# Patient Record
Sex: Male | Born: 1960 | Race: Asian | Hispanic: No | Marital: Married | Smoking: Never smoker
Health system: Southern US, Community
[De-identification: ages and names within clinical notes are randomized; demographics above are authoritative.]

## PROBLEM LIST (undated history)

## (undated) DIAGNOSIS — I480 Paroxysmal atrial fibrillation: Secondary | ICD-10-CM

## (undated) DIAGNOSIS — E0590 Thyrotoxicosis, unspecified without thyrotoxic crisis or storm: Secondary | ICD-10-CM

## (undated) DIAGNOSIS — Z9889 Other specified postprocedural states: Secondary | ICD-10-CM

## (undated) DIAGNOSIS — Z86010 Personal history of colonic polyps: Secondary | ICD-10-CM

## (undated) DIAGNOSIS — E785 Hyperlipidemia, unspecified: Secondary | ICD-10-CM

## (undated) DIAGNOSIS — N433 Hydrocele, unspecified: Secondary | ICD-10-CM

## (undated) DIAGNOSIS — G4730 Sleep apnea, unspecified: Secondary | ICD-10-CM

## (undated) DIAGNOSIS — I10 Essential (primary) hypertension: Secondary | ICD-10-CM

## (undated) HISTORY — DX: Other specified postprocedural states: Z98.890

## (undated) HISTORY — PX: COLONOSCOPY: SHX174

## (undated) HISTORY — PX: POLYPECTOMY: SHX149

## (undated) HISTORY — DX: Personal history of colonic polyps: Z86.010

## (undated) HISTORY — DX: Hydrocele, unspecified: N43.3

## (undated) HISTORY — DX: Paroxysmal atrial fibrillation: I48.0

## (undated) HISTORY — DX: Sleep apnea, unspecified: G47.30

## (undated) HISTORY — DX: Thyrotoxicosis, unspecified without thyrotoxic crisis or storm: E05.90

## (undated) HISTORY — DX: Essential (primary) hypertension: I10

## (undated) HISTORY — DX: Hyperlipidemia, unspecified: E78.5

---

## 2000-04-18 HISTORY — PX: KNEE SURGERY: SHX244

## 2001-05-18 ENCOUNTER — Emergency Department (HOSPITAL_COMMUNITY): Admission: EM | Admit: 2001-05-18 | Discharge: 2001-05-18 | Payer: Self-pay | Admitting: *Deleted

## 2002-11-18 ENCOUNTER — Encounter: Payer: Self-pay | Admitting: Specialist

## 2002-11-18 ENCOUNTER — Encounter: Admission: RE | Admit: 2002-11-18 | Discharge: 2002-11-18 | Payer: Self-pay | Admitting: Specialist

## 2004-05-20 ENCOUNTER — Ambulatory Visit: Payer: Self-pay | Admitting: Family Medicine

## 2004-06-03 ENCOUNTER — Ambulatory Visit: Payer: Self-pay | Admitting: Family Medicine

## 2004-07-29 ENCOUNTER — Ambulatory Visit: Payer: Self-pay | Admitting: Family Medicine

## 2004-08-03 ENCOUNTER — Ambulatory Visit: Payer: Self-pay | Admitting: Family Medicine

## 2005-01-10 ENCOUNTER — Encounter: Admission: RE | Admit: 2005-01-10 | Discharge: 2005-01-10 | Payer: Self-pay | Admitting: Occupational Medicine

## 2005-07-01 ENCOUNTER — Ambulatory Visit: Payer: Self-pay | Admitting: Internal Medicine

## 2005-08-31 ENCOUNTER — Ambulatory Visit: Payer: Self-pay | Admitting: Family Medicine

## 2005-09-09 ENCOUNTER — Ambulatory Visit: Payer: Self-pay | Admitting: Family Medicine

## 2006-09-06 ENCOUNTER — Ambulatory Visit: Payer: Self-pay | Admitting: Family Medicine

## 2006-09-06 LAB — CONVERTED CEMR LAB
Creatinine,U: 38.5 mg/dL
Hgb A1c MFr Bld: 5.6 % (ref 4.6–6.0)
Microalb Creat Ratio: 5.2 mg/g (ref 0.0–30.0)
Microalb, Ur: 0.2 mg/dL (ref 0.0–1.9)

## 2006-09-13 ENCOUNTER — Ambulatory Visit: Payer: Self-pay | Admitting: Family Medicine

## 2006-11-24 ENCOUNTER — Ambulatory Visit: Payer: Self-pay | Admitting: Family Medicine

## 2006-11-24 ENCOUNTER — Encounter: Payer: Self-pay | Admitting: Family Medicine

## 2006-11-24 DIAGNOSIS — B9789 Other viral agents as the cause of diseases classified elsewhere: Secondary | ICD-10-CM

## 2007-03-14 ENCOUNTER — Ambulatory Visit: Payer: Self-pay | Admitting: Family Medicine

## 2007-03-14 LAB — CONVERTED CEMR LAB: Hgb A1c MFr Bld: 6 % (ref 4.6–6.0)

## 2007-09-14 ENCOUNTER — Encounter: Payer: Self-pay | Admitting: *Deleted

## 2007-10-01 ENCOUNTER — Ambulatory Visit: Payer: Self-pay | Admitting: Family Medicine

## 2007-10-01 LAB — CONVERTED CEMR LAB
ALT: 31 units/L (ref 0–53)
AST: 29 units/L (ref 0–37)
Albumin: 4.3 g/dL (ref 3.5–5.2)
Alkaline Phosphatase: 49 units/L (ref 39–117)
BUN: 13 mg/dL (ref 6–23)
Basophils Absolute: 0 10*3/uL (ref 0.0–0.1)
Basophils Relative: 0.1 % (ref 0.0–1.0)
Bilirubin Urine: NEGATIVE
Bilirubin, Direct: 0.1 mg/dL (ref 0.0–0.3)
Blood in Urine, dipstick: NEGATIVE
CO2: 29 meq/L (ref 19–32)
Calcium: 8.9 mg/dL (ref 8.4–10.5)
Chloride: 101 meq/L (ref 96–112)
Cholesterol: 160 mg/dL (ref 0–200)
Creatinine, Ser: 0.8 mg/dL (ref 0.4–1.5)
Creatinine,U: 30.2 mg/dL
Direct LDL: 93.6 mg/dL
Eosinophils Absolute: 0.2 10*3/uL (ref 0.0–0.7)
Eosinophils Relative: 4.3 % (ref 0.0–5.0)
GFR calc Af Amer: 134 mL/min
GFR calc non Af Amer: 111 mL/min
Glucose, Bld: 80 mg/dL (ref 70–99)
Glucose, Urine, Semiquant: NEGATIVE
HCT: 41.3 % (ref 39.0–52.0)
HDL: 20 mg/dL — ABNORMAL LOW (ref >39.0)
Hemoglobin: 14.7 g/dL (ref 13.0–17.0)
Hgb A1c MFr Bld: 5.7 % (ref 4.6–6.0)
Ketones, urine, test strip: NEGATIVE
Lymphocytes Relative: 37.9 % (ref 12.0–46.0)
MCHC: 35.7 g/dL (ref 30.0–36.0)
MCV: 89.8 fL (ref 78.0–100.0)
Microalb Creat Ratio: 6.6 mg/g (ref 0.0–30.0)
Microalb, Ur: 0.2 mg/dL (ref 0.0–1.9)
Monocytes Absolute: 0.4 10*3/uL (ref 0.1–1.0)
Monocytes Relative: 8.6 % (ref 3.0–12.0)
Neutro Abs: 2.3 10*3/uL (ref 1.4–7.7)
Neutrophils Relative %: 49.1 % (ref 43.0–77.0)
Nitrite: NEGATIVE
Platelets: 140 10*3/uL — ABNORMAL LOW (ref 150–400)
Potassium: 3.7 meq/L (ref 3.5–5.1)
Protein, U semiquant: NEGATIVE
RBC: 4.6 M/uL (ref 4.22–5.81)
RDW: 12.2 % (ref 11.5–14.6)
Sodium: 138 meq/L (ref 135–145)
Specific Gravity, Urine: 1.01
TSH: 3.63 microintl units/mL (ref 0.35–5.50)
Total Bilirubin: 1.5 mg/dL — ABNORMAL HIGH (ref 0.3–1.2)
Total CHOL/HDL Ratio: 8
Total Protein: 7.3 g/dL (ref 6.0–8.3)
Triglycerides: 222 mg/dL (ref 0–149)
Urobilinogen, UA: 0.2
VLDL: 44 mg/dL — ABNORMAL HIGH (ref 0–40)
WBC Urine, dipstick: NEGATIVE
WBC: 4.6 10*3/uL (ref 4.5–10.5)
pH: 7

## 2007-10-08 ENCOUNTER — Ambulatory Visit: Payer: Self-pay | Admitting: Family Medicine

## 2007-11-01 ENCOUNTER — Telehealth: Payer: Self-pay | Admitting: Family Medicine

## 2008-03-28 ENCOUNTER — Ambulatory Visit: Payer: Self-pay | Admitting: Family Medicine

## 2008-03-28 LAB — CONVERTED CEMR LAB
BUN: 10 mg/dL (ref 6–23)
CO2: 29 meq/L (ref 19–32)
Calcium: 8.9 mg/dL (ref 8.4–10.5)
Chloride: 101 meq/L (ref 96–112)
Cholesterol: 165 mg/dL (ref 0–200)
Creatinine, Ser: 0.7 mg/dL (ref 0.4–1.5)
Direct LDL: 97.3 mg/dL
GFR calc Af Amer: 156 mL/min
GFR calc non Af Amer: 129 mL/min
Glucose, Bld: 115 mg/dL — ABNORMAL HIGH (ref 70–99)
HDL: 20 mg/dL — ABNORMAL LOW (ref >39.0)
Hgb A1c MFr Bld: 6.2 % — ABNORMAL HIGH (ref 4.6–6.0)
Potassium: 4.3 meq/L (ref 3.5–5.1)
Sodium: 136 meq/L (ref 135–145)
Total CHOL/HDL Ratio: 8.3
Triglycerides: 203 mg/dL (ref 0–149)
VLDL: 41 mg/dL — ABNORMAL HIGH (ref 0–40)

## 2008-04-08 ENCOUNTER — Telehealth: Payer: Self-pay | Admitting: Family Medicine

## 2008-06-07 DIAGNOSIS — M7520 Bicipital tendinitis, unspecified shoulder: Secondary | ICD-10-CM | POA: Insufficient documentation

## 2008-06-09 ENCOUNTER — Ambulatory Visit: Payer: Self-pay | Admitting: Family Medicine

## 2008-10-21 ENCOUNTER — Ambulatory Visit: Payer: Self-pay | Admitting: Family Medicine

## 2008-10-21 LAB — CONVERTED CEMR LAB
BUN: 11 mg/dL (ref 6–23)
Basophils Relative: 0.2 % (ref 0.0–3.0)
Bilirubin Urine: NEGATIVE
Bilirubin, Direct: 0 mg/dL (ref 0.0–0.3)
Blood in Urine, dipstick: NEGATIVE
CO2: 30 meq/L (ref 19–32)
Chloride: 103 meq/L (ref 96–112)
Cholesterol: 159 mg/dL (ref 0–200)
Creatinine, Ser: 0.8 mg/dL (ref 0.4–1.5)
Direct LDL: 95.8 mg/dL
Eosinophils Absolute: 0.1 10*3/uL (ref 0.0–0.7)
Glucose, Bld: 106 mg/dL — ABNORMAL HIGH (ref 70–99)
Glucose, Urine, Semiquant: NEGATIVE
Hgb A1c MFr Bld: 5.7 % (ref 4.6–6.5)
Ketones, urine, test strip: NEGATIVE
MCHC: 35.7 g/dL (ref 30.0–36.0)
MCV: 90.7 fL (ref 78.0–100.0)
Microalb, Ur: 0.8 mg/dL (ref 0.0–1.9)
Monocytes Absolute: 0.3 10*3/uL (ref 0.1–1.0)
Neutrophils Relative %: 51.9 % (ref 43.0–77.0)
Nitrite: NEGATIVE
Platelets: 136 10*3/uL — ABNORMAL LOW (ref 150.0–400.0)
Protein, U semiquant: NEGATIVE
RBC: 4.46 M/uL (ref 4.22–5.81)
Specific Gravity, Urine: 1.015
Total Protein: 7 g/dL (ref 6.0–8.3)
Urobilinogen, UA: 0.2
WBC Urine, dipstick: NEGATIVE
pH: 5.5

## 2008-10-27 ENCOUNTER — Encounter: Payer: Self-pay | Admitting: Family Medicine

## 2008-10-28 ENCOUNTER — Ambulatory Visit: Payer: Self-pay | Admitting: Family Medicine

## 2009-02-02 LAB — HM DIABETES EYE EXAM: HM Diabetic Eye Exam: NORMAL

## 2009-04-20 ENCOUNTER — Telehealth: Payer: Self-pay | Admitting: Family Medicine

## 2009-04-20 ENCOUNTER — Ambulatory Visit: Payer: Self-pay | Admitting: Family Medicine

## 2009-05-01 ENCOUNTER — Ambulatory Visit: Payer: Self-pay | Admitting: Family Medicine

## 2009-05-01 LAB — CONVERTED CEMR LAB
BUN: 7 mg/dL (ref 6–23)
CO2: 29 meq/L (ref 19–32)
Calcium: 9.2 mg/dL (ref 8.4–10.5)
Creatinine, Ser: 0.8 mg/dL (ref 0.4–1.5)
Glucose, Bld: 137 mg/dL — ABNORMAL HIGH (ref 70–99)

## 2009-05-06 ENCOUNTER — Ambulatory Visit: Payer: Self-pay | Admitting: Family Medicine

## 2009-10-28 ENCOUNTER — Ambulatory Visit: Payer: Self-pay | Admitting: Family Medicine

## 2009-10-28 LAB — CONVERTED CEMR LAB
ALT: 29 units/L (ref 0–53)
AST: 29 units/L (ref 0–37)
Basophils Relative: 0.3 % (ref 0.0–3.0)
Bilirubin, Direct: 0.1 mg/dL (ref 0.0–0.3)
Chloride: 101 meq/L (ref 96–112)
Direct LDL: 95.9 mg/dL
Eosinophils Relative: 1.7 % (ref 0.0–5.0)
Glucose, Urine, Semiquant: NEGATIVE
HCT: 42.4 % (ref 39.0–52.0)
Hgb A1c MFr Bld: 6 % (ref 4.6–6.5)
Lymphs Abs: 1.5 10*3/uL (ref 0.7–4.0)
MCV: 90.1 fL (ref 78.0–100.0)
Monocytes Absolute: 0.3 10*3/uL (ref 0.1–1.0)
Monocytes Relative: 7.9 % (ref 3.0–12.0)
Neutrophils Relative %: 52.8 % (ref 43.0–77.0)
Nitrite: NEGATIVE
Potassium: 4.9 meq/L (ref 3.5–5.1)
RBC: 4.7 M/uL (ref 4.22–5.81)
Sodium: 138 meq/L (ref 135–145)
Total CHOL/HDL Ratio: 7
Total Protein: 7.3 g/dL (ref 6.0–8.3)
Urobilinogen, UA: 0.2
VLDL: 57 mg/dL — ABNORMAL HIGH (ref 0.0–40.0)
WBC Urine, dipstick: NEGATIVE
WBC: 3.9 10*3/uL — ABNORMAL LOW (ref 4.5–10.5)

## 2009-10-30 ENCOUNTER — Ambulatory Visit: Payer: Self-pay | Admitting: Family Medicine

## 2009-10-30 LAB — HM DIABETES FOOT EXAM

## 2010-01-16 LAB — HM DIABETES EYE EXAM: HM Diabetic Eye Exam: NORMAL

## 2010-04-28 ENCOUNTER — Ambulatory Visit
Admission: RE | Admit: 2010-04-28 | Discharge: 2010-04-28 | Payer: Self-pay | Source: Home / Self Care | Attending: Family Medicine | Admitting: Family Medicine

## 2010-04-28 ENCOUNTER — Other Ambulatory Visit: Payer: Self-pay | Admitting: Family Medicine

## 2010-04-28 LAB — BASIC METABOLIC PANEL
BUN: 10 mg/dL (ref 6–23)
CO2: 29 mEq/L (ref 19–32)
Calcium: 9.1 mg/dL (ref 8.4–10.5)
Chloride: 103 mEq/L (ref 96–112)
Creatinine, Ser: 0.8 mg/dL (ref 0.4–1.5)
GFR: 112.59 mL/min (ref >60.00)
Glucose, Bld: 140 mg/dL — ABNORMAL HIGH (ref 70–99)
Potassium: 4.4 mEq/L (ref 3.5–5.1)
Sodium: 139 mEq/L (ref 135–145)

## 2010-04-28 LAB — HEMOGLOBIN A1C: Hgb A1c MFr Bld: 6.4 % (ref 4.6–6.5)

## 2010-05-05 ENCOUNTER — Ambulatory Visit
Admission: RE | Admit: 2010-05-05 | Discharge: 2010-05-05 | Payer: Self-pay | Source: Home / Self Care | Attending: Family Medicine | Admitting: Family Medicine

## 2010-05-18 NOTE — Assessment & Plan Note (Signed)
Summary: 6 month rov/njr   Vital Signs:  Patient profile:   50 year old male Weight:      166 pounds Temp:     98.4 degrees F oral BP sitting:   130 / 70  (left arm) Cuff size:   regular  Vitals Entered By: Kern Reap CMA Duncan Dull) (May 06, 2009 8:24 AM)  Reason for Visit 6 month follow up dm   History of Present Illness: shade is a 50 year old, married male, nonsmoker, who comes in today for evaluation of diabetes.  His fasting blood chair at home ranges from 110 to 125.  He takes Glucotrol 10 mg b.i.d., Glucophage 500 mg b.i.d. his weight is stable at 166.  He is meticulous about his diet and exercise program.  A1c6.3%.  BP rechecked by me 130/70  Allergies (verified): No Known Drug Allergies  Past History:  Past medical, surgical, family and social histories (including risk factors) reviewed for relevance to current acute and chronic problems.  Past Medical History: Reviewed history from 03/14/2007 and no changes required. Diabetes mellitus, type II Hyperlipidemia Hyperthyroidism right hydrocele right ACL repair  Family History: Reviewed history from 03/14/2007 and no changes required. Family History of Asthma Family History Diabetes 1st degree relative Family History Hypertension  Social History: Reviewed history from 03/14/2007 and no changes required. Occupation: [employer] Married Never Smoked Alcohol use-no Drug use-no Regular exercise-yes  Review of Systems      See HPI  Physical Exam  General:  Well-developed,well-nourished,in no acute distress; alert,appropriate and cooperative throughout examination   Impression & Recommendations:  Problem # 1:  DIABETES MELLITUS, TYPE II (ICD-250.00)  His updated medication list for this problem includes:    Altace 2.5 Mg Caps (Ramipril) ..... One in the morning    Glucophage 500 Mg Tabs (Metformin hcl) ..... One by mouth two times a day    Glucotrol Xl 10 Mg Tb24 (Glipizide) ..... One by mouth two  times a day  Complete Medication List: 1)  Altace 2.5 Mg Caps (Ramipril) .... One in the morning 2)  Glucophage 500 Mg Tabs (Metformin hcl) .... One by mouth two times a day 3)  Glucotrol Xl 10 Mg Tb24 (Glipizide) .... One by mouth two times a day 4)  Niaspan 500 Mg Tbcr (Niacin (antihyperlipidemic)) .... Take 1 tablet by mouth every morning 5)  Accu-chek Active Strp (Glucose blood) .... Use daily 6)  Flonase 50 Mcg/act Susp (Fluticasone propionate) .... Uad  Other Orders: Prescription Created Electronically [phone])  Patient Instructions: 1)  continue your current treatment program.  We turned the first week in July for your annual follow-up 2)  BMP prior to visit, ICD-9:.............250.00 3)  Hepatic Panel prior to visit, ICD-9: 4)  Lipid Panel prior to visit, ICD-9: 5)  TSH prior to visit, ICD-9: 6)  CBC w/ Diff prior to visit, ICD-9: 7)  Urine-dip prior to visit, ICD-9: 8)  HbgA1C prior to visit, ICD-9: 9)  Urine Microalbumin prior to visit, ICD-9:

## 2010-05-18 NOTE — Progress Notes (Signed)
Summary: wants ov today with dr Dorcus Riga  Phone Note Call from Patient Call back at [phone]   Caller: Patient-live call Reason for Call: Talk to Nurse Summary of Call: wants ov today. Has congestion. Initial call taken by: Warnell Forester,  April 20, 2009 10:14 AM  Follow-up for Phone Call        Fleet Contras please call if congestion.  His allergy mediated then plain Zyrtec or plain Claritin or, if it's viral then treat symptomatically with liquids.  Tylenol, etc. does not need o V  unless  less running fever, wheezing, etc. Follow-up by: Roderick Pee MD,  April 20, 2009 10:49 AM  Additional Follow-up for Phone Call Additional follow up Details #1::        patient walked in.  will be here for office visit this afternoon Additional Follow-up by: Kern Reap CMA Duncan Dull),  April 20, 2009 1:51 PM

## 2010-05-18 NOTE — Assessment & Plan Note (Signed)
Summary: cpx/njr   Vital Signs:  Patient profile:   50 year old male Height:      68 inches Weight:      158 pounds BMI:     24.11 Temp:     98.5 degrees F oral BP sitting:   124 / 78  (left arm) Cuff size:   regular  Vitals Entered By: Kern Reap CMA Duncan Dull) (October 30, 2009 8:22 AM) CC: cpx   CC:  cpx.  History of Present Illness: William Nixon  is a 50 year old, married male, nonsmokerwho comes in today for annual evaluation of diabetes, hypertension, allergic rhinitis.  His diabetes is treated with Glucophage 500 mg b.i.d., Glucotrol, 10 mg b.i.d., fasting blood sugar at home around 110.  A1c6.0, which represents excellent control.  He is on Altace 2.5 mg daily BP at home 124/78.  He also takes Niaspan 500 mg daily for elevated triglycerides.  He also uses Flonase nasal spray for allergic rhinitis.  Tetanus, but she 2010 seasonal flu 2010 exam October 2010 Dr. Luciana Axe, normal.  No retinopathy or  Diabetes Management History:      He says that he is exercising.    Allergies: No Known Drug Allergies  Past History:  Past medical, surgical, family and social histories (including risk factors) reviewed, and no changes noted (except as noted below).  Past Medical History: Reviewed history from 03/14/2007 and no changes required. Diabetes mellitus, type II Hyperlipidemia Hyperthyroidism right hydrocele right ACL repair  Family History: Reviewed history from 03/14/2007 and no changes required. Family History of Asthma Family History Diabetes 1st degree relative Family History Hypertension  Social History: Reviewed history from 03/14/2007 and no changes required. Occupation: [employer]. Married Never Smoked Alcohol use-no Drug use-no Regular exercise-yes  Review of Systems      See HPI  Physical Exam  General:  Well-developed,well-nourished,in no acute distress; alert,appropriate and cooperative throughout examination Head:  Normocephalic and atraumatic without  obvious abnormalities. No apparent alopecia or balding. Eyes:  No corneal or conjunctival inflammation noted. EOMI. Perrla. Funduscopic exam benign, without hemorrhages, exudates or papilledema. Vision grossly normal. Ears:  External ear exam shows no significant lesions or deformities.  Otoscopic examination reveals clear canals, tympanic membranes are intact bilaterally without bulging, retraction, inflammation or discharge. Hearing is grossly normal bilaterally. Nose:  External nasal examination shows no deformity or inflammation. Nasal mucosa are pink and moist without lesions or exudates. Mouth:  Oral mucosa and oropharynx without lesions or exudates.  Teeth in good repair. Neck:  No deformities, masses, or tenderness noted. Chest Wall:  No deformities, masses, tenderness or gynecomastia noted. Breasts:  No masses or gynecomastia noted Lungs:  Normal respiratory effort, chest expands symmetrically. Lungs are clear to auscultation, no crackles or wheezes. Heart:  Normal rate and regular rhythm. S1 and S2 normal without gallop, murmur, click, rub or other extra sounds. Abdomen:  Bowel sounds positive,abdomen soft and non-tender without masses, organomegaly or hernias noted. Rectal:  No external abnormalities noted. Normal sphincter tone. No rectal masses or tenderness. Genitalia:  Testes bilaterally descended without nodularity, tenderness or masses. No scrotal masses or lesions. No penis lesions or urethral discharge. Prostate:  Prostate gland firm and smooth, no enlargement, nodularity, tenderness, mass, asymmetry or induration. Msk:  No deformity or scoliosis noted of thoracic or lumbar spine.   Pulses:  R and L carotid,radial,femoral,dorsalis pedis and posterior tibial pulses are full and equal bilaterally Extremities:  No clubbing, cyanosis, edema, or deformity noted with normal full range of motion of all  joints.   Neurologic:  No cranial nerve deficits noted. Station and gait are normal.  Plantar reflexes are down-going bilaterally. DTRs are symmetrical throughout. Sensory, motor and coordinative functions appear intact. Skin:  Intact without suspicious lesions or rashes Cervical Nodes:  No lymphadenopathy noted Axillary Nodes:  No palpable lymphadenopathy Inguinal Nodes:  No significant adenopathy Psych:  Cognition and judgment appear intact. Alert and cooperative with normal attention span and concentration. No apparent delusions, illusions, hallucinations  Diabetes Management Exam:    Foot Exam (with socks and/or shoes not present):       Sensory-Pinprick/Light touch:          Left medial foot (L-4): normal          Left dorsal foot (L-5): normal          Left lateral foot (S-1): normal          Right medial foot (L-4): normal          Right dorsal foot (L-5): normal          Right lateral foot (S-1): normal       Sensory-Monofilament:          Left foot: normal          Right foot: normal       Inspection:          Left foot: normal          Right foot: normal       Nails:          Left foot: normal          Right foot: normal    Eye Exam:       Eye Exam done elsewhere          Date: 01/27/2009          Results: normal          Done by: rankin   Impression & Recommendations:  Problem # 1:  PHYSICAL EXAMINATION (ICD-V70.0) Assessment Unchanged  Orders: Prescription Created Electronically [phone])  Problem # 2:  HYPERLIPIDEMIA (ICD-272.4) Assessment: Improved  His updated medication list for this problem includes:    Niaspan 500 Mg Tbcr (Niacin (antihyperlipidemic)) .Marland Kitchen... Take 1 tablet by mouth two times a day  Orders: Prescription Created Electronically [phone]) EKG w/ Interpretation (93000)  Problem # 3:  DIABETES MELLITUS, TYPE II (ICD-250.00) Assessment: Improved  His updated medication list for this problem includes:    Altace 2.5 Mg Caps (Ramipril) ..... One in the morning    Glucophage 500 Mg Tabs (Metformin hcl) ..... One by mouth two  times a day    Glucotrol Xl 10 Mg Tb24 (Glipizide) ..... One by mouth two times a day  Orders: Prescription Created Electronically [phone]) EKG w/ Interpretation (93000)  Complete Medication List: 1)  Altace 2.5 Mg Caps (Ramipril) .... One in the morning 2)  Glucophage 500 Mg Tabs (Metformin hcl) .... One by mouth two times a day 3)  Glucotrol Xl 10 Mg Tb24 (Glipizide) .... One by mouth two times a day 4)  Niaspan 500 Mg Tbcr (Niacin (antihyperlipidemic)) .... Take 1 tablet by mouth two times a day 5)  Accu-chek Active Strp (Glucose blood) .... Use daily 6)  Flonase 50 Mcg/act Susp (Fluticasone propionate) .... Uad  Patient Instructions: 1)  Please schedule a follow-up appointment in 6 months. 2)  Check your blood sugars regularly. If your readings are usually above : or below 70 you should contact our office. 3)  It is  important that your Diabetic A1c level is checked every 3 months. 4)  See your eye doctor yearly to check for diabetic eye damage. 5)  Check your feet each night for sore areas, calluses or signs of infection. 6)  Check your Blood Pressure regularly. If it is above: you should make an appointment. 7)  BMP prior to visit, ICD-9:....250.00 8)  HbgA1C prior to visit, ICD-9: 9)  increase the Niaspan  to 500 mg b.i.d. Prescriptions: FLONASE 50 MCG/ACT SUSP (FLUTICASONE PROPIONATE) UAD  #1 x 1   Entered and Authorized by:   Roderick Pee MD   Signed by:   Roderick Pee MD on 10/30/2009   Method used:   Electronically to        [facility] [phone]* (retail)       [address]       [place], [place]  [address]       Ph: [phone]       Fax: [phone]   RxID:   [id number] ACCU-CHEK ACTIVE   STRP (GLUCOSE BLOOD) use daily  #100 x 3   Entered and Authorized by:   Roderick Pee MD   Signed by:   Roderick Pee MD on 10/30/2009   Method used:   Electronically to        [facility] # [phone]* (retail)       [address]       [place], [place]   [address]       Ph: [phone]       Fax: [phone]   RxID:   [id number] GLUCOTROL XL 10 MG TB24 (GLIPIZIDE) one by mouth two times a day  #200 x 3   Entered and Authorized by:   Roderick Pee MD   Signed by:   Roderick Pee MD on 10/30/2009   Method used:   Electronically to        [facility] # [phone]* (retail)       [address]       [place], [place]  [address]       Ph: [phone]       Fax: [phone]   RxID:   [id number] GLUCOPHAGE 500 MG TABS (METFORMIN HCL) one by mouth two times a day  #200 x 3   Entered and Authorized by:   Roderick Pee MD   Signed by:   Roderick Pee MD on 10/30/2009   Method used:   Electronically to        [facility] # [phone]* (retail)       [address]       [place], [place]  [address]       Ph: [phone]       Fax: [phone]   RxID:   [id number] ALTACE 2.5 MG CAPS (RAMIPRIL) one in the morning  #100 x 3   Entered and Authorized by:   Roderick Pee MD   Signed by:   Roderick Pee MD on 10/30/2009   Method used:   Electronically to        [facility] # [phone]* (retail)       [address]       [place], [place]  [address]       Ph: [phone]       Fax: [phone]   RxID:   [id number] NIASPAN 500 MG  TBCR (NIACIN (ANTIHYPERLIPIDEMIC)) Take 1 tablet by mouth two times a day  #200  x 3   Entered and Authorized by:   Roderick Pee MD   Signed by:   Roderick Pee MD on 10/30/2009   Method used:   Electronically to        [facility] # [phone]* (retail)       [address]       [place], [place]  [address]       Ph: [phone]       Fax: [phone]   RxID:   [id number]    Immunization History:  Influenza Immunization History:    Influenza:  historical (01/16/2009)

## 2010-05-18 NOTE — Assessment & Plan Note (Signed)
Summary: SINUSISTIS//LH   Vital Signs:  Patient profile:   50 year old male Weight:      160 pounds Temp:     98.8 degrees F oral BP sitting:   120 / 80  (left arm) Cuff size:   regular  Vitals Entered By: Kern Reap CMA Duncan Dull) (April 20, 2009 2:30 PM)  Reason for Visit sinus pressure congestion   History of Present Illness: P is a 50 year old, married male, nonsmoker, who comes in with a cold for one week.  His main complaint is head congestion.  He has no fever, earache, and sore throat.  He does have a slight cough, but no sputum production.  Review of systems negative for hypertension, diabetes, under good control.  Last eye exam February 12, 2009 normal.  No retinopathy  Allergies: No Known Drug Allergies  Past History:  Past medical, surgical, family and social histories (including risk factors) reviewed for relevance to current acute and chronic problems.  Past Medical History: Reviewed history from 03/14/2007 and no changes required. Diabetes mellitus, type II Hyperlipidemia Hyperthyroidism right hydrocele right ACL repair  Family History: Reviewed history from 03/14/2007 and no changes required. Family History of Asthma Family History Diabetes 1st degree relative Family History Hypertension  Social History: Reviewed history from 03/14/2007 and no changes required. Occupation: [employer]. Married Never Smoked Alcohol use-no Drug use-no Regular exercise-yes  Review of Systems      See HPI  Physical Exam  General:  Well-developed,well-nourished,in no acute distress; alert,appropriate and cooperative throughout examination Head:  Normocephalic and atraumatic without obvious abnormalities. No apparent alopecia or balding. Eyes:  No corneal or conjunctival inflammation noted. EOMI. Perrla. Funduscopic exam benign, without hemorrhages, exudates or papilledema. Vision grossly normal. Ears:  External ear exam shows no significant lesions or deformities.   Otoscopic examination reveals clear canals, tympanic membranes are intact bilaterally without bulging, retraction, inflammation or discharge. Hearing is grossly normal bilaterally. Nose:  External nasal examination shows no deformity or inflammation. Nasal mucosa are pink and moist without lesions or exudates. Mouth:  Oral mucosa and oropharynx without lesions or exudates.  Teeth in good repair. Neck:  No deformities, masses, or tenderness noted. Chest Wall:  No deformities, masses, tenderness or gynecomastia noted. Lungs:  Normal respiratory effort, chest expands symmetrically. Lungs are clear to auscultation, no crackles or wheezes.  Diabetes Management Exam:    Foot Exam (with socks and/or shoes not present):       Sensory-Pinprick/Light touch:          Left medial foot (L-4): normal          Left dorsal foot (L-5): normal          Left lateral foot (S-1): normal          Right medial foot (L-4): normal          Right dorsal foot (L-5): normal          Right lateral foot (S-1): normal       Sensory-Monofilament:          Left foot: normal          Right foot: normal       Inspection:          Left foot: normal          Right foot: normal       Nails:          Left foot: normal          Right foot:  normal    Eye Exam:       Eye Exam done elsewhere          Date: 02/02/2009          Results: normal          Done by: dr Barbaraann Barthel   Impression & Recommendations:  Problem # 1:  INFECTION, VIRAL NOS (ICD-079.99) Assessment Deteriorated  His updated medication list for this problem includes:    Hydromet 5-1.5 Mg/77ml Syrp (Hydrocodone-homatropine) .Marland Kitchen... 1 or 2 tsps at bedtime as needed  Complete Medication List: 1)  Altace 2.5 Mg Caps (Ramipril) .... One in the morning 2)  Glucophage 500 Mg Tabs (Metformin hcl) .... One by mouth two times a day 3)  Glucotrol Xl 10 Mg Tb24 (Glipizide) .... One by mouth two times a day 4)  Niaspan 500 Mg Tbcr (Niacin (antihyperlipidemic)) .... Take  1 tablet by mouth every morning 5)  Accu-chek Active Strp (Glucose blood) .... Use daily 6)  Hydromet 5-1.5 Mg/62ml Syrp (Hydrocodone-homatropine) .Marland Kitchen.. 1 or 2 tsps at bedtime as needed 7)  Flonase 50 Mcg/act Susp (Fluticasone propionate) .... Uad  Patient Instructions: 1)  consume a 30 ounces of water daily. 2)  Take one or 2 teaspoons of Hydromet at bedtime as needed for cough and cold. 3)  Use Afrin nasal spray, one shot up each nostril followed by the steroid nasal spray, followed by the saline nasal spray  with a netti pot to help the , head congestion Prescriptions: FLONASE 50 MCG/ACT SUSP (FLUTICASONE PROPIONATE) UAD  #1 x 1   Entered and Authorized by:   Roderick Pee MD   Signed by:   Roderick Pee MD on 04/20/2009   Method used:     RxID:   [id number] HYDROMET 5-1.5 MG/5ML SYRP (HYDROCODONE-HOMATROPINE) 1 or 2 tsps at bedtime as needed  #8oz x 1   Entered and Authorized by:   Roderick Pee MD   Signed by:   Roderick Pee MD on 04/20/2009   Method used:     RxID:   [id number]

## 2010-05-20 NOTE — Assessment & Plan Note (Signed)
Summary: 6 month fup//ccm   Vital Signs:  Patient profile:   50 year old male Weight:      158 pounds Temp:     98.3 degrees F oral BP sitting:   142 / 80  (left arm) Cuff size:   regular  Vitals Entered By: Duard Brady LPN (May 05, 2010 8:29 AM) CC: 6 mos rov - doing well   fbs 118 Is Patient Diabetic? Yes Did you bring your meter with you today? No   CC:  6 mos rov - doing well   fbs 118.  History of Present Illness: Pras. is a 50 year old, married male, nonsmoker, who comes in today for evaluation of type 2 diabetes.  He currently takes Glucophage 500 mg b.i.d., Glucotrol, 10 mg b.i.d., blood sugar at home.  It is averaging in the 115, 120 range.  Hemoglobin A1c6.4.  Typically his A1c is in the 5.5 range, however, he's not been as active.  This winter.  Review of systems negative  Preventive Screening-Counseling & Management  Alcohol-Tobacco     Smoking Status: never  Allergies (verified): No Known Drug Allergies  Past History:  Past medical, surgical, family and social histories (including risk factors) reviewed for relevance to current acute and chronic problems.  Past Medical History: Reviewed history from 03/14/2007 and no changes required. Diabetes mellitus, type II Hyperlipidemia Hyperthyroidism right hydrocele right ACL repair  Family History: Reviewed history from 03/14/2007 and no changes required. Family History of Asthma Family History Diabetes 1st degree relative Family History Hypertension  Social History: Reviewed history from 03/14/2007 and no changes required. Occupation: [employer]. Married Never Smoked Alcohol use-no Drug use-no Regular exercise-yes  Review of Systems      See HPI  Physical Exam  General:  Well-developed,well-nourished,in no acute distress; alert,appropriate and cooperative throughout examination   Impression & Recommendations:  Problem # 1:  DIABETES MELLITUS, TYPE II (ICD-250.00) Assessment  Improved  His updated medication list for this problem includes:    Altace 2.5 Mg Caps (Ramipril) ..... One in the morning    Glucophage 500 Mg Tabs (Metformin hcl) ..... One by mouth two times a day    Glucotrol Xl 10 Mg Tb24 (Glipizide) ..... One by mouth two times a day  Complete Medication List: 1)  Altace 2.5 Mg Caps (Ramipril) .... One in the morning 2)  Glucophage 500 Mg Tabs (Metformin hcl) .... One by mouth two times a day 3)  Glucotrol Xl 10 Mg Tb24 (Glipizide) .... One by mouth two times a day 4)  Niaspan 500 Mg Tbcr (Niacin (antihyperlipidemic)) .... Take 1 tablet by mouth two times a day 5)  Accu-chek Active Strp (Glucose blood) .... Use daily 6)  Flonase 50 Mcg/act Susp (Fluticasone propionate) .... Uad  Patient Instructions: 1)  Please schedule a follow-up appointment in 6 months.....250.00 2)  BMP prior to visit, ICD-9: 3)  Hepatic Panel prior to visit, ICD-9: 4)  Lipid Panel prior to visit, ICD-9: 5)  TSH prior to visit, ICD-9: 6)  CBC w/ Diff prior to visit, ICD-9: 7)  Urine-dip prior to visit, ICD-9: 8)  HbgA1C prior to visit, ICD-9: 9)  Urine Microalbumin prior to visit, ICD-9:   Orders Added: 1)  Est. Patient Level III [74259]

## 2010-06-18 ENCOUNTER — Other Ambulatory Visit: Payer: Self-pay | Admitting: Family Medicine

## 2010-10-14 ENCOUNTER — Other Ambulatory Visit: Payer: Self-pay | Admitting: Family Medicine

## 2010-10-27 ENCOUNTER — Other Ambulatory Visit (INDEPENDENT_AMBULATORY_CARE_PROVIDER_SITE_OTHER): Payer: BC Managed Care – PPO

## 2010-10-27 DIAGNOSIS — Z0000 Encounter for general adult medical examination without abnormal findings: Secondary | ICD-10-CM

## 2010-10-27 LAB — CBC WITH DIFFERENTIAL/PLATELET
Basophils Relative: 0.3 % (ref 0.0–3.0)
Eosinophils Relative: 3 % (ref 0.0–5.0)
HCT: 42.9 % (ref 39.0–52.0)
Hemoglobin: 14.9 g/dL (ref 13.0–17.0)
Lymphs Abs: 1.5 10*3/uL (ref 0.7–4.0)
Monocytes Relative: 6.3 % (ref 3.0–12.0)
Platelets: 138 10*3/uL — ABNORMAL LOW (ref 150.0–400.0)
RBC: 4.69 Mil/uL (ref 4.22–5.81)
WBC: 4.1 10*3/uL — ABNORMAL LOW (ref 4.5–10.5)

## 2010-10-27 LAB — POCT URINALYSIS DIPSTICK
Bilirubin, UA: NEGATIVE
Leukocytes, UA: NEGATIVE
Nitrite, UA: NEGATIVE
Protein, UA: NEGATIVE
pH, UA: 7

## 2010-10-27 LAB — HEPATIC FUNCTION PANEL
ALT: 33 U/L (ref 0–53)
AST: 30 U/L (ref 0–37)
Total Bilirubin: 1.2 mg/dL (ref 0.3–1.2)

## 2010-10-27 LAB — HEMOGLOBIN A1C: Hgb A1c MFr Bld: 6.5 % (ref 4.6–6.5)

## 2010-10-27 LAB — BASIC METABOLIC PANEL
BUN: 12 mg/dL (ref 6–23)
GFR: 112.35 mL/min (ref >60.00)
Potassium: 3.8 mEq/L (ref 3.5–5.1)
Sodium: 137 mEq/L (ref 135–145)

## 2010-10-27 LAB — LIPID PANEL
LDL Cholesterol: 91 mg/dL (ref 0–99)
VLDL: 35.4 mg/dL (ref 0.0–40.0)

## 2010-10-27 LAB — TSH: TSH: 3.57 u[IU]/mL (ref 0.35–5.50)

## 2010-10-28 ENCOUNTER — Encounter: Payer: Self-pay | Admitting: Family Medicine

## 2010-11-02 ENCOUNTER — Ambulatory Visit (INDEPENDENT_AMBULATORY_CARE_PROVIDER_SITE_OTHER): Payer: BC Managed Care – PPO | Admitting: Family Medicine

## 2010-11-02 ENCOUNTER — Encounter: Payer: Self-pay | Admitting: Family Medicine

## 2010-11-02 DIAGNOSIS — E119 Type 2 diabetes mellitus without complications: Secondary | ICD-10-CM

## 2010-11-02 DIAGNOSIS — Z0000 Encounter for general adult medical examination without abnormal findings: Secondary | ICD-10-CM

## 2010-11-02 DIAGNOSIS — E785 Hyperlipidemia, unspecified: Secondary | ICD-10-CM

## 2010-11-02 MED ORDER — METFORMIN HCL 500 MG PO TABS: 500.0000 mg | ORAL_TABLET | Freq: Two times a day (BID) | ORAL | Status: DC

## 2010-11-02 MED ORDER — GLIPIZIDE ER 10 MG PO TB24: 10.0000 mg | ORAL_TABLET | Freq: Two times a day (BID) | ORAL | Status: DC

## 2010-11-02 MED ORDER — GLUCOSE BLOOD VI STRP: ORAL_STRIP | Status: DC

## 2010-11-02 MED ORDER — RAMIPRIL 2.5 MG PO CAPS: 2.5000 mg | ORAL_CAPSULE | ORAL | Status: DC

## 2010-11-02 NOTE — Patient Instructions (Signed)
Continue your current medications.  Follow-up office visit in 6 months.  Blood work one week prior

## 2010-11-02 NOTE — Progress Notes (Signed)
  Subjective:    Patient ID: William Nixon, male    DOB: 07-Sep-1960, 50 y.o.   MRN: [id number]  HPIprashanth Is a delightful, 50 year old, married male, nonsmoker, who comes in today for evaluation of allergic rhinitis.  Diabetes, type II hyperlipidemia.  His diabetes is treated with metformin 500 mg b.i.d. And glipizide 10 mg b.i.d.  Blood sugar 137A1c 6.5%.  He uses steroid nasal spray p.r.n. For allergic rhinitis.  He takes Niaspan 500 mg b.i.d. For mild hyperlipidemia.  He also takes Altace 2.5 mg daily for renal protection because of the diabetes.  Routine eye care, dental care, tetanus, 2010, annual eye exam October 2011 normal.  No retinopathy.  BP at home 128/80    Review of Systems  Constitutional: Negative.   HENT: Negative.   Eyes: Negative.   Respiratory: Negative.   Cardiovascular: Negative.   Gastrointestinal: Negative.   Genitourinary: Negative.   Musculoskeletal: Negative.   Skin: Negative.   Neurological: Negative.   Hematological: Negative.   Psychiatric/Behavioral: Negative.        Objective:   Physical Exam  Constitutional: He is oriented to person, place, and time. He appears well-developed and well-nourished.  HENT:  Head: Normocephalic and atraumatic.  Right Ear: External ear normal.  Left Ear: External ear normal.  Nose: Nose normal.  Mouth/Throat: Oropharynx is clear and moist.  Eyes: Conjunctivae and EOM are normal. Pupils are equal, round, and reactive to light.  Neck: Normal range of motion. Neck supple. No JVD present. No tracheal deviation present. No thyromegaly present.  Cardiovascular: Normal rate, regular rhythm, normal heart sounds and intact distal pulses.  Exam reveals no gallop and no friction rub.   No murmur heard. Pulmonary/Chest: Effort normal and breath sounds normal. No stridor. No respiratory distress. He has no wheezes. He has no rales. He exhibits no tenderness.  Abdominal: Soft. Bowel sounds are normal. He exhibits  no distension and no mass. There is no tenderness. There is no rebound and no guarding.  Genitourinary: Rectum normal, prostate normal and penis normal. Guaiac negative stool. No penile tenderness.  Musculoskeletal: Normal range of motion. He exhibits no edema and no tenderness.  Lymphadenopathy:    He has no cervical adenopathy.  Neurological: He is alert and oriented to person, place, and time. He has normal reflexes. No cranial nerve deficit. He exhibits normal muscle tone.  Skin: Skin is warm and dry. No rash noted. No erythema. No pallor.  Psychiatric: He has a normal mood and affect. His behavior is normal. Judgment and thought content normal.          Assessment & Plan:  Healthy male.  Allergic rhinitis.  Continue Flonase p.r.n.  Diabetes type II.  Continue glipizide 10 mg b.i.d. And metformin 500 b.i.d.  Continue aspirin alternate 7 Niaspan.  Return in 6 months for follow-up.  A1c

## 2010-12-13 ENCOUNTER — Other Ambulatory Visit: Payer: Self-pay | Admitting: Family Medicine

## 2011-03-29 ENCOUNTER — Telehealth: Payer: Self-pay | Admitting: Family Medicine

## 2011-03-29 NOTE — Telephone Encounter (Signed)
Transderm-Scop patches dispensed 3 directions one patch every 72 hours, refills x 1

## 2011-03-29 NOTE — Telephone Encounter (Signed)
Pt is going on a cruise on 04/07/11 for 5 days and is needing to get a patch for motion sickness called in to [facility] on [address].

## 2011-04-01 MED ORDER — SCOPOLAMINE 1 MG/3DAYS TD PT72: 1.0000 | MEDICATED_PATCH | TRANSDERMAL | Status: DC

## 2011-04-01 NOTE — Telephone Encounter (Signed)
Rx sent to pharmacy   

## 2011-04-27 ENCOUNTER — Other Ambulatory Visit (INDEPENDENT_AMBULATORY_CARE_PROVIDER_SITE_OTHER): Payer: BC Managed Care – PPO

## 2011-04-27 DIAGNOSIS — E119 Type 2 diabetes mellitus without complications: Secondary | ICD-10-CM

## 2011-04-27 LAB — BASIC METABOLIC PANEL
BUN: 12 mg/dL (ref 6–23)
Calcium: 9.4 mg/dL (ref 8.4–10.5)
Creatinine, Ser: 0.8 mg/dL (ref 0.4–1.5)
GFR: 105.84 mL/min (ref >60.00)

## 2011-04-27 LAB — MICROALBUMIN / CREATININE URINE RATIO: Microalb, Ur: 2.2 mg/dL — ABNORMAL HIGH (ref 0.0–1.9)

## 2011-05-04 ENCOUNTER — Ambulatory Visit (INDEPENDENT_AMBULATORY_CARE_PROVIDER_SITE_OTHER): Payer: BC Managed Care – PPO | Admitting: Family Medicine

## 2011-05-04 ENCOUNTER — Encounter: Payer: Self-pay | Admitting: Family Medicine

## 2011-05-04 DIAGNOSIS — J069 Acute upper respiratory infection, unspecified: Secondary | ICD-10-CM

## 2011-05-04 DIAGNOSIS — E119 Type 2 diabetes mellitus without complications: Secondary | ICD-10-CM

## 2011-05-04 MED ORDER — HYDROCODONE-HOMATROPINE 5-1.5 MG/5ML PO SYRP: ORAL_SOLUTION | ORAL | Status: DC

## 2011-05-04 NOTE — Progress Notes (Signed)
  Subjective:    Patient ID: William Nixon, male    DOB: 05/02/1960, 51 y.o.   MRN: [id number]  HPI P. Is a 51 year old, married male, nonsmoker, who comes in today for evaluation of two problems.  He has long-standing diabetes, type 2, which she manages with diet, exercise on a daily basis, but the side, 10 mg b.i.d., and metformin 500 mg b.i.d.  Blood sugar at home 120 to 130A1c 6.7% and stable.  Three days ago he developed a viral infection with head congestion, sore throat, and cough.  No fever, no sputum production.  No difficulty breathing.   Review of Systems    General and pulmonary review of systems otherwise negative Objective:   Physical Exam  Well-developed well-nourished, male in no acute distress.  HEENT negative.  Neck was supple.  No adenopathy.  Lungs are clear      Assessment & Plan:  Diabetes type II adult continue current therapy.  Follow-up annual physical.  Viral syndrome.  Treat symptomatically with liquids.  Cough syrup.  Return p.r.n.

## 2011-05-04 NOTE — Patient Instructions (Signed)
Continue your current medications for your diabetes.  You are doing an excellent job.  For your viral infection.  Hydromet one half to 1 teaspoon at bedtime as needed for cough and cold.  Set up your annual physical for July.  Labs one week prior

## 2011-05-05 ENCOUNTER — Telehealth: Payer: Self-pay | Admitting: Family Medicine

## 2011-05-05 MED ORDER — FLUTICASONE PROPIONATE 50 MCG/ACT NA SUSP: 2.0000 | Freq: Every day | NASAL | Status: DC

## 2011-05-05 NOTE — Telephone Encounter (Signed)
Pt was seen yesterday and did not get his refill of Flonase spray. Please send to [facility]. Thanks.

## 2011-11-02 ENCOUNTER — Other Ambulatory Visit (INDEPENDENT_AMBULATORY_CARE_PROVIDER_SITE_OTHER): Payer: BC Managed Care – PPO

## 2011-11-02 DIAGNOSIS — E119 Type 2 diabetes mellitus without complications: Secondary | ICD-10-CM

## 2011-11-02 LAB — POCT URINALYSIS DIPSTICK
Bilirubin, UA: NEGATIVE
Blood, UA: NEGATIVE
Glucose, UA: NEGATIVE
Nitrite, UA: NEGATIVE

## 2011-11-02 LAB — LIPID PANEL
HDL: 27 mg/dL — ABNORMAL LOW (ref >39.00)
Triglycerides: 215 mg/dL — ABNORMAL HIGH (ref 0.0–149.0)
VLDL: 43 mg/dL — ABNORMAL HIGH (ref 0.0–40.0)

## 2011-11-02 LAB — BASIC METABOLIC PANEL
CO2: 28 mEq/L (ref 19–32)
Chloride: 99 mEq/L (ref 96–112)
GFR: 110.26 mL/min (ref >60.00)
Glucose, Bld: 144 mg/dL — ABNORMAL HIGH (ref 70–99)
Potassium: 3.9 mEq/L (ref 3.5–5.1)
Sodium: 137 mEq/L (ref 135–145)

## 2011-11-02 LAB — HEPATIC FUNCTION PANEL
ALT: 40 U/L (ref 0–53)
Total Bilirubin: 1.1 mg/dL (ref 0.3–1.2)
Total Protein: 7.3 g/dL (ref 6.0–8.3)

## 2011-11-02 LAB — CBC WITH DIFFERENTIAL/PLATELET
Basophils Relative: 0.4 % (ref 0.0–3.0)
Eosinophils Relative: 3.2 % (ref 0.0–5.0)
Lymphocytes Relative: 36.7 % (ref 12.0–46.0)
Neutrophils Relative %: 52.7 % (ref 43.0–77.0)
RBC: 4.76 Mil/uL (ref 4.22–5.81)
WBC: 4.2 10*3/uL — ABNORMAL LOW (ref 4.5–10.5)

## 2011-11-02 LAB — LDL CHOLESTEROL, DIRECT: Direct LDL: 98.4 mg/dL

## 2011-11-09 ENCOUNTER — Encounter: Payer: BC Managed Care – PPO | Admitting: Family Medicine

## 2011-11-17 ENCOUNTER — Ambulatory Visit (INDEPENDENT_AMBULATORY_CARE_PROVIDER_SITE_OTHER): Payer: BC Managed Care – PPO | Admitting: Family Medicine

## 2011-11-17 ENCOUNTER — Encounter: Payer: Self-pay | Admitting: Family Medicine

## 2011-11-17 VITALS — BP 130/80 | Temp 98.0°F | Ht 69.0 in | Wt 158.0 lb

## 2011-11-17 DIAGNOSIS — E785 Hyperlipidemia, unspecified: Secondary | ICD-10-CM

## 2011-11-17 DIAGNOSIS — E119 Type 2 diabetes mellitus without complications: Secondary | ICD-10-CM

## 2011-11-17 DIAGNOSIS — J3081 Allergic rhinitis due to animal (cat) (dog) hair and dander: Secondary | ICD-10-CM

## 2011-11-17 MED ORDER — RAMIPRIL 2.5 MG PO CAPS: 2.5000 mg | ORAL_CAPSULE | ORAL | Status: DC

## 2011-11-17 MED ORDER — GLUCOSE BLOOD VI STRP: ORAL_STRIP | Status: DC

## 2011-11-17 MED ORDER — NIACIN ER (ANTIHYPERLIPIDEMIC) 500 MG PO TBCR: 500.0000 mg | EXTENDED_RELEASE_TABLET | Freq: Two times a day (BID) | ORAL | Status: DC

## 2011-11-17 MED ORDER — GLIPIZIDE ER 10 MG PO TB24: 10.0000 mg | ORAL_TABLET | Freq: Two times a day (BID) | ORAL | Status: DC

## 2011-11-17 MED ORDER — FLUTICASONE PROPIONATE 50 MCG/ACT NA SUSP: 2.0000 | Freq: Every day | NASAL | Status: DC

## 2011-11-17 MED ORDER — METFORMIN HCL 500 MG PO TABS: 500.0000 mg | ORAL_TABLET | Freq: Two times a day (BID) | ORAL | Status: DC

## 2011-11-17 NOTE — Patient Instructions (Signed)
Continue current medications  We will set you up for a screening colonoscopy  Followup on your blood sugar in 6 months  Labs one week prior to office visit

## 2011-11-17 NOTE — Progress Notes (Signed)
  Subjective:    Patient ID: William Nixon, male    DOB: 10-12-1960, 51 y.o.   MRN: [id number]  HPI P. is a 51 year old married male nonsmoker who comes in today for physical examination because of a history of hypertension and diabetes  His med list reviewed in detail and there've been no changes. His blood sugars are averaging 80-120 at home a. Hemoglobin A1c here recently 6.6%  He gets routine eye care, dental care, will schedule for screening colonoscopy, tetanus 2010.   Review of Systems  Constitutional: Negative.   HENT: Negative.   Eyes: Negative.   Respiratory: Negative.   Cardiovascular: Negative.   Gastrointestinal: Negative.   Genitourinary: Negative.   Musculoskeletal: Negative.   Skin: Negative.   Neurological: Negative.   Hematological: Negative.   Psychiatric/Behavioral: Negative.        Objective:   Physical Exam  Constitutional: He is oriented to person, place, and time. He appears well-developed and well-nourished.  HENT:  Head: Normocephalic and atraumatic.  Right Ear: External ear normal.  Left Ear: External ear normal.  Nose: Nose normal.  Mouth/Throat: Oropharynx is clear and moist.  Eyes: Conjunctivae and EOM are normal. Pupils are equal, round, and reactive to light.  Neck: Normal range of motion. Neck supple. No JVD present. No tracheal deviation present. No thyromegaly present.  Cardiovascular: Normal rate, regular rhythm, normal heart sounds and intact distal pulses.  Exam reveals no gallop and no friction rub.   No murmur heard. Pulmonary/Chest: Effort normal and breath sounds normal. No stridor. No respiratory distress. He has no wheezes. He has no rales. He exhibits no tenderness.  Abdominal: Soft. Bowel sounds are normal. He exhibits no distension and no mass. There is no tenderness. There is no rebound and no guarding.  Genitourinary: Rectum normal, prostate normal and penis normal. Guaiac negative stool. No penile tenderness.    Musculoskeletal: Normal range of motion. He exhibits no edema and no tenderness.  Lymphadenopathy:    He has no cervical adenopathy.  Neurological: He is alert and oriented to person, place, and time. He has normal reflexes. No cranial nerve deficit. He exhibits normal muscle tone.  Skin: Skin is warm and dry. No rash noted. No erythema. No pallor.  Psychiatric: He has a normal mood and affect. His behavior is normal. Judgment and thought content normal.          Assessment & Plan:  Healthy male  Diabetes type 2 continue current medication  Allergic rhinitis continue current medication  Mild hypertension continue all medication  Return in one year sooner if any problem we'll set up for screening colonoscopy

## 2011-12-22 ENCOUNTER — Telehealth: Payer: Self-pay | Admitting: Gastroenterology

## 2011-12-22 ENCOUNTER — Encounter: Payer: Self-pay | Admitting: Gastroenterology

## 2011-12-29 NOTE — Telephone Encounter (Signed)
error 

## 2012-01-02 ENCOUNTER — Ambulatory Visit (INDEPENDENT_AMBULATORY_CARE_PROVIDER_SITE_OTHER): Payer: BC Managed Care – PPO | Admitting: Family Medicine

## 2012-01-02 ENCOUNTER — Encounter: Payer: Self-pay | Admitting: Family Medicine

## 2012-01-02 VITALS — BP 180/80 | Temp 98.0°F | Wt 158.0 lb

## 2012-01-02 DIAGNOSIS — J069 Acute upper respiratory infection, unspecified: Secondary | ICD-10-CM

## 2012-01-02 DIAGNOSIS — E119 Type 2 diabetes mellitus without complications: Secondary | ICD-10-CM

## 2012-01-02 DIAGNOSIS — I10 Essential (primary) hypertension: Secondary | ICD-10-CM | POA: Insufficient documentation

## 2012-01-02 DIAGNOSIS — E1169 Type 2 diabetes mellitus with other specified complication: Secondary | ICD-10-CM

## 2012-01-02 DIAGNOSIS — E1159 Type 2 diabetes mellitus with other circulatory complications: Secondary | ICD-10-CM

## 2012-01-02 MED ORDER — RAMIPRIL 5 MG PO CAPS: 5.0000 mg | ORAL_CAPSULE | Freq: Every day | ORAL | Status: DC

## 2012-01-02 NOTE — Patient Instructions (Signed)
Take 2.5 mg of Ramapo now  Starting tomorrow morning take 5 mg daily in the morning  Check your blood pressure daily in the morning  Return in one week for followup

## 2012-01-02 NOTE — Progress Notes (Signed)
  Subjective:    Patient ID: William Nixon, male    DOB: July 24, 1960, 51 y.o.   MRN: [id number]  HPIp. Is a 51 year old male married nonsmoker who comes in today for evaluation of lightheadedness  He states that yesterday he was driving home and began to feel lightheaded. Blood sugar was in the 138 range. He's been on 2.5 mg of Ramapo daily for renal protection because of the diabetes. His blood pressure and the past has always been normal. He exercises on a regular basis he runs about 45 miles per day.    Review of Systems    general and cardiac review of systems otherwise negative Objective:   Physical Exam  Well-developed well-nourished male in no acute distress BP right arm sitting position 160/90      Assessment & Plan:  Hypertension associated with diabetes plan increase Avapro to 5 mg daily BP check daily followup in one week

## 2012-01-03 ENCOUNTER — Ambulatory Visit (AMBULATORY_SURGERY_CENTER): Payer: BC Managed Care – PPO | Admitting: *Deleted

## 2012-01-03 ENCOUNTER — Encounter: Payer: Self-pay | Admitting: Gastroenterology

## 2012-01-03 VITALS — Ht 68.0 in | Wt 159.2 lb

## 2012-01-03 DIAGNOSIS — Z1211 Encounter for screening for malignant neoplasm of colon: Secondary | ICD-10-CM

## 2012-01-03 MED ORDER — NA SULFATE-K SULFATE-MG SULF 17.5-3.13-1.6 GM/177ML PO SOLN: 1.0000 | Freq: Once | ORAL | Status: DC

## 2012-01-09 ENCOUNTER — Ambulatory Visit (INDEPENDENT_AMBULATORY_CARE_PROVIDER_SITE_OTHER): Payer: BC Managed Care – PPO | Admitting: Family Medicine

## 2012-01-09 ENCOUNTER — Encounter: Payer: Self-pay | Admitting: Family Medicine

## 2012-01-09 VITALS — BP 130/84 | Temp 98.4°F | Wt 159.0 lb

## 2012-01-09 DIAGNOSIS — E1169 Type 2 diabetes mellitus with other specified complication: Secondary | ICD-10-CM

## 2012-01-09 DIAGNOSIS — I10 Essential (primary) hypertension: Secondary | ICD-10-CM

## 2012-01-09 DIAGNOSIS — E1159 Type 2 diabetes mellitus with other circulatory complications: Secondary | ICD-10-CM

## 2012-01-09 NOTE — Progress Notes (Signed)
  Subjective:    Patient ID: William Nixon, male    DOB: 14-Sep-1960, 51 y.o.   MRN: [id number]  HPI P is a 51 year old male type II diabetic who has underlying hypertension who comes in for followup. We recently saw him his blood sugar is normal however his blood pressure was elevated we therefore increased his Prempro from 2.5 mg daily to 5 mg. He comes back today for followup. BP 130/84 also home blood pressure monitoring shows blood pressures now drop back to normal.  He is an appointment to see orthopedics Dr. Derenda Fennel on Thursday    Review of Systems General and cardiovascular review of systems otherwise negative    Objective:   Physical Exam Well-developed well-nourished male in no acute distress BP right arm sitting position 130/84       Assessment & Plan:  Hypertension at goal monitor blood pressure return if it's persistently elevated over a two-week period time for medication readjustment

## 2012-01-09 NOTE — Patient Instructions (Signed)
Monitor your blood pressure 3 times weekly return when necessary  Blood pressure goal 135/85 or less

## 2012-01-16 ENCOUNTER — Encounter: Payer: BC Managed Care – PPO | Admitting: Gastroenterology

## 2012-03-02 ENCOUNTER — Encounter: Payer: Self-pay | Admitting: Family Medicine

## 2012-03-06 ENCOUNTER — Encounter (HOSPITAL_COMMUNITY): Payer: Self-pay | Admitting: Emergency Medicine

## 2012-03-06 ENCOUNTER — Emergency Department (HOSPITAL_COMMUNITY)
Admission: EM | Admit: 2012-03-06 | Discharge: 2012-03-06 | Disposition: A | Discharge status: Home / Self Care | Payer: No Typology Code available for payment source | Attending: Emergency Medicine | Admitting: Emergency Medicine

## 2012-03-06 DIAGNOSIS — I10 Essential (primary) hypertension: Secondary | ICD-10-CM | POA: Insufficient documentation

## 2012-03-06 DIAGNOSIS — S1093XA Contusion of unspecified part of neck, initial encounter: Secondary | ICD-10-CM | POA: Insufficient documentation

## 2012-03-06 DIAGNOSIS — E119 Type 2 diabetes mellitus without complications: Secondary | ICD-10-CM | POA: Insufficient documentation

## 2012-03-06 DIAGNOSIS — S0003XA Contusion of scalp, initial encounter: Secondary | ICD-10-CM | POA: Insufficient documentation

## 2012-03-06 DIAGNOSIS — E785 Hyperlipidemia, unspecified: Secondary | ICD-10-CM | POA: Insufficient documentation

## 2012-03-06 DIAGNOSIS — Y9389 Activity, other specified: Secondary | ICD-10-CM | POA: Insufficient documentation

## 2012-03-06 DIAGNOSIS — Z9889 Other specified postprocedural states: Secondary | ICD-10-CM | POA: Insufficient documentation

## 2012-03-06 DIAGNOSIS — Z79899 Other long term (current) drug therapy: Secondary | ICD-10-CM | POA: Insufficient documentation

## 2012-03-06 DIAGNOSIS — E039 Hypothyroidism, unspecified: Secondary | ICD-10-CM | POA: Insufficient documentation
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
## 2012-03-09 NOTE — ED Provider Notes (Signed)
Medical screening examination/treatment/procedure(s) were performed by non-physician practitioner and as supervising physician I was immediately available for consultation/collaboration.  Toy Baker, MD 03/09/12 [phone]

## 2012-05-17 ENCOUNTER — Other Ambulatory Visit: Payer: BC Managed Care – PPO

## 2012-05-18 ENCOUNTER — Other Ambulatory Visit (INDEPENDENT_AMBULATORY_CARE_PROVIDER_SITE_OTHER): Payer: BC Managed Care – PPO

## 2012-05-18 DIAGNOSIS — E119 Type 2 diabetes mellitus without complications: Secondary | ICD-10-CM

## 2012-05-18 LAB — BASIC METABOLIC PANEL
BUN: 13 mg/dL (ref 6–23)
CO2: 30 mEq/L (ref 19–32)
Calcium: 8.9 mg/dL (ref 8.4–10.5)
Creatinine, Ser: 0.7 mg/dL (ref 0.4–1.5)
GFR: 118.64 mL/min (ref >60.00)
Glucose, Bld: 180 mg/dL — ABNORMAL HIGH (ref 70–99)

## 2012-05-24 ENCOUNTER — Ambulatory Visit: Payer: BC Managed Care – PPO | Admitting: Family Medicine

## 2012-05-28 ENCOUNTER — Encounter: Payer: Self-pay | Admitting: Family Medicine

## 2012-05-28 ENCOUNTER — Ambulatory Visit (INDEPENDENT_AMBULATORY_CARE_PROVIDER_SITE_OTHER): Payer: BC Managed Care – PPO | Admitting: Family Medicine

## 2012-05-28 VITALS — BP 140/80 | HR 84 | Temp 98.5°F | Wt 147.0 lb

## 2012-05-28 DIAGNOSIS — E1169 Type 2 diabetes mellitus with other specified complication: Secondary | ICD-10-CM

## 2012-05-28 DIAGNOSIS — E119 Type 2 diabetes mellitus without complications: Secondary | ICD-10-CM

## 2012-05-28 DIAGNOSIS — I10 Essential (primary) hypertension: Secondary | ICD-10-CM

## 2012-05-28 MED ORDER — METFORMIN HCL 1000 MG PO TABS: ORAL_TABLET | ORAL | Status: DC

## 2012-05-28 NOTE — Progress Notes (Signed)
  Subjective:    Patient ID: William Nixon, male    DOB: 1960-08-29, 52 y.o.   MRN: [id number]  HPI P. Is a 52 year old male married nonsmoker who comes in today for evaluation of diabetes type 2  He's been on metformin 500 mg twice a day glipizide 10 mg twice a day in the past his blood sugars have been in the 110 range with an A1c of 6.5%. Male his blood sugars in the 180 range A1c is 8.1%  He is been compliant with his medication exercise and diet weight is stable at 147 pounds he works out daily  Blood pressure is slightly elevated 140/80 he's currently on Altace 5 mg daily   Review of Systems    review of systems otherwise negative Objective:   Physical Exam  Well-developed well-nourished thin male no acute distress      Assessment & Plan:  Diabetes type 2 not at goal increase metformin to 1000 mg twice a day continue glipizide 10 mg twice a day followup in 3 months

## 2012-05-28 NOTE — Patient Instructions (Signed)
Check a fasting blood sugar daily in the morning  Continue the glipizide 10 mg twice a day  Increase the metformin to 1000 mg twice a day  Return in 3 months for followup  Nonfasting labs one week prior  Continue your blood pressure medication,,,,,,,,, also stay on a salt free diet  Check your blood pressure daily in the morning  When you return in 3 months bring a record of your blood pressure and blood sugars

## 2012-05-29 ENCOUNTER — Ambulatory Visit: Payer: BC Managed Care – PPO | Admitting: Family Medicine

## 2012-05-30 ENCOUNTER — Ambulatory Visit: Payer: BC Managed Care – PPO | Admitting: Family Medicine

## 2012-08-22 ENCOUNTER — Other Ambulatory Visit (INDEPENDENT_AMBULATORY_CARE_PROVIDER_SITE_OTHER): Payer: BC Managed Care – PPO

## 2012-08-22 DIAGNOSIS — E119 Type 2 diabetes mellitus without complications: Secondary | ICD-10-CM

## 2012-08-22 LAB — BASIC METABOLIC PANEL
BUN: 10 mg/dL (ref 6–23)
Calcium: 9.3 mg/dL (ref 8.4–10.5)
Creatinine, Ser: 0.7 mg/dL (ref 0.4–1.5)
GFR: 135.24 mL/min (ref >60.00)
Glucose, Bld: 103 mg/dL — ABNORMAL HIGH (ref 70–99)

## 2012-08-22 LAB — HEMOGLOBIN A1C: Hgb A1c MFr Bld: 6.4 % (ref 4.6–6.5)

## 2012-08-27 ENCOUNTER — Encounter: Payer: Self-pay | Admitting: Family Medicine

## 2012-08-27 ENCOUNTER — Ambulatory Visit: Payer: BC Managed Care – PPO | Admitting: Family Medicine

## 2012-08-27 ENCOUNTER — Ambulatory Visit (INDEPENDENT_AMBULATORY_CARE_PROVIDER_SITE_OTHER): Payer: BC Managed Care – PPO | Admitting: Family Medicine

## 2012-08-27 VITALS — BP 120/80 | Temp 98.8°F | Wt 154.0 lb

## 2012-08-27 DIAGNOSIS — E119 Type 2 diabetes mellitus without complications: Secondary | ICD-10-CM

## 2012-08-27 MED ORDER — GLUCOSE BLOOD VI STRP: 1.0000 | ORAL_STRIP | Freq: Every day | Status: DC

## 2012-08-27 NOTE — Patient Instructions (Signed)
Glipizide 10 mg and metformin 500 mg in the morning  Prior to your evening meal glipizide 10 mg in 1000 mg of metformin  If you still have episodes of hypo-glycemia then decrease the morning dose of glipizide to 5 mg  Followup in July for your annual exam

## 2012-08-27 NOTE — Progress Notes (Signed)
  Subjective:    Patient ID: William Nixon, male    DOB: 10-25-1960, 52 y.o.   MRN: [id number]  HPI chadwin is a 52 year old male who comes in today for evaluation of diabetes type 2  3 months ago his A1c went up to 8.1. We increased his medication to 1000 mg of metformin twice a day and continued the glipizide 10 mg twice a day. His blood sugars dropped in the 100 range and A1c now 6.4  He's having difficulty now with hypoglycemia. He runs about 4-5 miles per day. He runs about 6 PM. He states she's been splitting his metformin. Instead of taking 1000 mg in the morning he takes 500 mg in the morning and 500 before his evening meal and then 1000 before dinner.   Review of Systems Review of systems otherwise negative    Objective:   Physical Exam  Well-developed well-nourished male in no acute distress  Diabetic foot exam normal no neuropathy pulses normal      Assessment & Plan:  Diabetes type 2 plan decrease morning dose to 500 mg of metformin continue the thousand milligrams dose prior to his evening meal and the glipizide 10 mg twice a day if this combination still results and hypoglycemia than he's to call

## 2012-09-03 ENCOUNTER — Ambulatory Visit: Payer: BC Managed Care – PPO | Admitting: Family Medicine

## 2012-11-07 ENCOUNTER — Other Ambulatory Visit (INDEPENDENT_AMBULATORY_CARE_PROVIDER_SITE_OTHER): Payer: BC Managed Care – PPO

## 2012-11-07 DIAGNOSIS — E119 Type 2 diabetes mellitus without complications: Secondary | ICD-10-CM

## 2012-11-07 LAB — HEMOGLOBIN A1C: Hgb A1c MFr Bld: 6.8 % — ABNORMAL HIGH (ref 4.6–6.5)

## 2012-11-07 LAB — POCT URINALYSIS DIPSTICK
Bilirubin, UA: NEGATIVE
Ketones, UA: NEGATIVE
Leukocytes, UA: NEGATIVE
Nitrite, UA: NEGATIVE
Protein, UA: NEGATIVE
pH, UA: 7

## 2012-11-07 LAB — HEPATIC FUNCTION PANEL
AST: 24 U/L (ref 0–37)
Total Bilirubin: 1.2 mg/dL (ref 0.3–1.2)

## 2012-11-07 LAB — CBC WITH DIFFERENTIAL/PLATELET
Basophils Absolute: 0 10*3/uL (ref 0.0–0.1)
Eosinophils Relative: 3.4 % (ref 0.0–5.0)
Lymphs Abs: 1.5 10*3/uL (ref 0.7–4.0)
Monocytes Absolute: 0.3 10*3/uL (ref 0.1–1.0)
Monocytes Relative: 6.8 % (ref 3.0–12.0)
Neutrophils Relative %: 55.2 % (ref 43.0–77.0)
Platelets: 149 10*3/uL — ABNORMAL LOW (ref 150.0–400.0)
RDW: 12.7 % (ref 11.5–14.6)
WBC: 4.5 10*3/uL (ref 4.5–10.5)

## 2012-11-07 LAB — LIPID PANEL
Total CHOL/HDL Ratio: 7
Triglycerides: 295 mg/dL — ABNORMAL HIGH (ref 0.0–149.0)

## 2012-11-07 LAB — PSA: PSA: 1.15 ng/mL (ref 0.10–4.00)

## 2012-11-07 LAB — BASIC METABOLIC PANEL
BUN: 9 mg/dL (ref 6–23)
Chloride: 99 mEq/L (ref 96–112)
Creatinine, Ser: 0.7 mg/dL (ref 0.4–1.5)
Glucose, Bld: 140 mg/dL — ABNORMAL HIGH (ref 70–99)
Potassium: 4.3 mEq/L (ref 3.5–5.1)

## 2012-11-07 LAB — MICROALBUMIN / CREATININE URINE RATIO: Creatinine,U: 47.8 mg/dL

## 2012-11-15 ENCOUNTER — Encounter: Payer: BC Managed Care – PPO | Admitting: Family Medicine

## 2012-11-19 ENCOUNTER — Other Ambulatory Visit: Payer: Self-pay | Admitting: Family Medicine

## 2012-11-20 ENCOUNTER — Ambulatory Visit (INDEPENDENT_AMBULATORY_CARE_PROVIDER_SITE_OTHER): Payer: BC Managed Care – PPO | Admitting: Family

## 2012-11-20 ENCOUNTER — Encounter: Payer: Self-pay | Admitting: Family

## 2012-11-20 VITALS — BP 158/88 | HR 71 | Ht 69.0 in | Wt 153.0 lb

## 2012-11-20 DIAGNOSIS — Z0000 Encounter for general adult medical examination without abnormal findings: Secondary | ICD-10-CM

## 2012-11-20 DIAGNOSIS — E119 Type 2 diabetes mellitus without complications: Secondary | ICD-10-CM

## 2012-11-20 DIAGNOSIS — I10 Essential (primary) hypertension: Secondary | ICD-10-CM

## 2012-11-20 NOTE — Progress Notes (Signed)
Subjective:    Patient ID: William Nixon, male    DOB: 10-05-1960, 52 y.o.   MRN: [id number]  HPI  Patient presents for yearly preventative medicine examination.ll I  A long time and she and in ll immunizations and health maintenance protocols were reviewed with the patient and they are up to date with these protocols. Screening laboratory values were reviewed with the patient including screening of hyperlipidemia PSA renal function and hepatic function. There medications past medical history social history problem list and allergies were reviewed in detail. Goals were established with regard to weight loss exercise diet in compliance with medications   Review of Systems  Constitutional: Negative.   HENT: Negative.   Eyes: Negative.   Respiratory: Negative.   Cardiovascular: Negative.   Gastrointestinal: Negative.   Endocrine: Negative.   Genitourinary: Negative.   Musculoskeletal: Negative.   Skin: Negative.   Allergic/Immunologic: Negative.   Neurological: Negative.   Hematological: Negative.   Psychiatric/Behavioral: Negative.    Past Medical History  Diagnosis Date  . Diabetes mellitus     type II  . Hyperlipidemia   . Hyperthyroidism   . Right hydrocele   . S/P ACL repair     right   . Hypertension     History   Social History  . Marital Status: Married    Spouse Name: N/A    Number of Children: N/A  . Years of Education: N/A   Occupational History  . Not on file.   Social History Main Topics  . Smoking status: Never Smoker   . Smokeless tobacco: Never Used  . Alcohol Use: 1.0 oz/week    2 drink(s) per week  . Drug Use: No  . Sexually Active: Not on file   Other Topics Concern  . Not on file   Social History Narrative  . No narrative on file    Past Surgical History  Procedure Laterality Date  . Knee surgery  2002    right    Family History  Problem Relation Age of Onset  . Alcohol abuse Other   . Diabetes Other   . Hypertension Other    . Colon cancer Neg Hx   . Rectal cancer Neg Hx   . Stomach cancer Neg Hx   . Esophageal cancer Neg Hx     No Known Allergies  Current Outpatient Prescriptions on File Prior to Visit  Medication Sig Dispense Refill  . fish oil-omega-3 fatty acids 1000 MG capsule Take 1 g by mouth 2 (two) times daily.       Marland Kitchen GLIPIZIDE XL 10 MG 24 hr tablet take 1 tablet by mouth twice a day  200 tablet  3  . glucose blood test strip 1 each by Other route daily. Dx 250.00 Accu- Check Nano  100 each  12  . metFORMIN (GLUCOPHAGE) 1000 MG tablet 1 by mouth twice a day  200 tablet  3  . metFORMIN (GLUCOPHAGE) 500 MG tablet Take 1 tablet (500 mg total) by mouth 2 (two) times daily.  200 tablet  3  . Multiple Vitamin (MULTIVITAMIN WITH MINERALS) TABS Take 1 tablet by mouth daily. Centrum      . NIASPAN 500 MG CR tablet take 1 tablet by mouth twice a day  200 tablet  3  . ramipril (ALTACE) 5 MG capsule Take 1 capsule (5 mg total) by mouth daily.  90 capsule  3  . Bioflavonoid Products (BIOFLEX PO) Take 1 tablet by mouth daily.  No current facility-administered medications on file prior to visit.    BP 158/88  Pulse 71  Ht 5\' 9"  (1.753 m)  Wt 153 lb (69.4 kg)  BMI 22.58 kg/m2  SpO2 98%chart    Objective:   Physical Exam  Constitutional: He is oriented to person, place, and time. He appears well-developed and well-nourished.  HENT:  Head: Normocephalic.  Right Ear: External ear normal.  Left Ear: External ear normal.  Nose: Nose normal.  Mouth/Throat: Oropharynx is clear and moist.  Eyes: Conjunctivae and EOM are normal. Pupils are equal, round, and reactive to light.  Neck: Normal range of motion. Neck supple. No thyromegaly present.  Cardiovascular: Normal rate, regular rhythm and normal heart sounds.   Pulmonary/Chest: Effort normal and breath sounds normal.  Abdominal: Soft. Bowel sounds are normal.  Musculoskeletal: Normal range of motion. He exhibits no edema and no tenderness.   Neurological: He is alert and oriented to person, place, and time. He has normal reflexes.  Skin: Skin is warm and dry.  Psychiatric: He has a normal mood and affect.          Assessment & Plan:  Assessment: 1. Preventative Healthcare 2. Type 2 DM 3. Hypercholesterolemia 4. Hypertension  Plan: Continue current meds. Continue a healthy diet and exercise. Call the office with any questions or concerns. Recheck in 6 months and sooner as needed.

## 2012-11-20 NOTE — Patient Instructions (Addendum)
Colonoscopy  A colonoscopy is an exam to evaluate your entire colon. In this exam, your colon is cleansed. A long fiberoptic tube is inserted through your rectum and into your colon. The fiberoptic scope (endoscope) is a long bundle of enclosed and very flexible fibers. These fibers transmit light to the area examined and send images from that area to your caregiver. Discomfort is usually minimal. You may be given a drug to help you sleep (sedative) during or prior to the procedure. This exam helps to detect lumps (tumors), polyps, inflammation, and areas of bleeding. Your caregiver may also take a small piece of tissue (biopsy) that will be examined under a microscope.  LET YOUR CAREGIVER KNOW ABOUT:   · Allergies to food or medicine.  · Medicines taken, including vitamins, herbs, eyedrops, over-the-counter medicines, and creams.  · Use of steroids (by mouth or creams).  · Previous problems with anesthetics or numbing medicines.  · History of bleeding problems or blood clots.  · Previous surgery.  · Other health problems, including diabetes and kidney problems.  · Possibility of pregnancy, if this applies.  BEFORE THE PROCEDURE   · A clear liquid diet may be required for 2 days before the exam.  · Ask your caregiver about changing or stopping your regular medications.  · Liquid injections (enemas) or laxatives may be required.  · A large amount of electrolyte solution may be given to you to drink over a short period of time. This solution is used to clean out your colon.  · You should be present 60 minutes prior to your procedure or as directed by your caregiver.  AFTER THE PROCEDURE   · If you received a sedative or pain relieving medication, you will need to arrange for someone to drive you home.  · Occasionally, there is a little blood passed with the first bowel movement. Do not be concerned.  FINDING OUT THE RESULTS OF YOUR TEST  Not all test results are available during your visit. If your test results are  not back during the visit, make an appointment with your caregiver to find out the results. Do not assume everything is normal if you have not heard from your caregiver or the medical facility. It is important for you to follow up on all of your test results.  HOME CARE INSTRUCTIONS   · It is not unusual to pass moderate amounts of gas and experience mild abdominal cramping following the procedure. This is due to air being used to inflate your colon during the exam. Walking or a warm pack on your belly (abdomen) may help.  · You may resume all normal meals and activities after sedatives and medicines have worn off.  · Only take over-the-counter or prescription medicines for pain, discomfort, or fever as directed by your caregiver. Do not use aspirin or blood thinners if a biopsy was taken. Consult your caregiver for medicine usage if biopsies were taken.  SEEK IMMEDIATE MEDICAL CARE IF:   · You have a fever.  · You pass large blood clots or fill a toilet with blood following the procedure. This may also occur 10 to 14 days following the procedure. This is more likely if a biopsy was taken.  · You develop abdominal pain that keeps getting worse and cannot be relieved with medicine.  Document Released: 04/01/2000 Document Revised: 06/27/2011 Document Reviewed: 11/15/2007  [employer]® Patient Information ©2014 [employer].

## 2012-11-26 ENCOUNTER — Other Ambulatory Visit: Payer: Self-pay

## 2012-11-26 MED ORDER — GLUCOSE BLOOD VI STRP: 1.0000 | ORAL_STRIP | Freq: Every day | Status: DC

## 2012-11-28 ENCOUNTER — Telehealth: Payer: Self-pay | Admitting: Family Medicine

## 2012-11-28 NOTE — Telephone Encounter (Signed)
PT states that he would like to speak with you in regards to his new machine and his testing strips. He's trying to figure out the cheapest way to fill his strips. Please assist.

## 2012-11-29 MED ORDER — GLUCOSE BLOOD VI STRP: ORAL_STRIP | Status: DC

## 2012-11-29 NOTE — Telephone Encounter (Signed)
Spoke with pharmacy and patient.  New rx sent.

## 2012-12-05 ENCOUNTER — Encounter: Payer: Self-pay | Admitting: Internal Medicine

## 2012-12-20 ENCOUNTER — Other Ambulatory Visit: Payer: Self-pay | Admitting: Family Medicine

## 2013-01-10 ENCOUNTER — Telehealth: Payer: Self-pay | Admitting: Family Medicine

## 2013-01-10 DIAGNOSIS — E119 Type 2 diabetes mellitus without complications: Secondary | ICD-10-CM

## 2013-01-10 NOTE — Telephone Encounter (Signed)
Pt had last a1c on 11-20-12. Pt would like to sch another a1c in 6 month. Can I sch?

## 2013-01-11 NOTE — Telephone Encounter (Signed)
Pt is sch °

## 2013-01-11 NOTE — Telephone Encounter (Signed)
I have ordered the labs.  Okay to schedule.

## 2013-01-17 ENCOUNTER — Other Ambulatory Visit: Payer: Self-pay | Admitting: Family Medicine

## 2013-02-07 ENCOUNTER — Encounter: Payer: BC Managed Care – PPO | Admitting: Internal Medicine

## 2013-03-08 ENCOUNTER — Other Ambulatory Visit: Payer: Self-pay | Admitting: *Deleted

## 2013-03-08 MED ORDER — NIACIN ER 500 MG PO CPCR: 500.0000 mg | ORAL_CAPSULE | Freq: Every day | ORAL | Status: DC

## 2013-03-08 NOTE — Telephone Encounter (Signed)
Insurance will no longer cover Niaspan.  New Rx sent for niacin.

## 2013-03-11 ENCOUNTER — Encounter: Payer: Self-pay | Admitting: Family Medicine

## 2013-05-01 ENCOUNTER — Telehealth: Payer: Self-pay | Admitting: Family Medicine

## 2013-05-01 DIAGNOSIS — E119 Type 2 diabetes mellitus without complications: Secondary | ICD-10-CM

## 2013-05-01 NOTE — Telephone Encounter (Signed)
Pt is [facility] to [facility] [address] from [facility]. Pt needs new rxs ramipril 5 mg #90, glipizide xl 10 mg #180, accu check glucose test strips #90, niacin 500 mg #180  and metformin 1000 mg #270.

## 2013-05-02 MED ORDER — NIACIN ER (ANTIHYPERLIPIDEMIC) 500 MG PO TBCR: EXTENDED_RELEASE_TABLET | ORAL | Status: DC

## 2013-05-02 MED ORDER — GLIPIZIDE ER 10 MG PO TB24: ORAL_TABLET | ORAL | Status: DC

## 2013-05-02 MED ORDER — METFORMIN HCL 1000 MG PO TABS
ORAL_TABLET | ORAL | Status: DC
Start: 2013-05-02 — End: 2013-05-28

## 2013-05-02 MED ORDER — RAMIPRIL 5 MG PO CAPS: ORAL_CAPSULE | ORAL | Status: DC

## 2013-05-02 NOTE — Telephone Encounter (Signed)
Medications sent but unable to send test strips because not sure which accu chek test strips to refill. Tried to call patient but unable to leave message.

## 2013-05-28 ENCOUNTER — Other Ambulatory Visit: Payer: Self-pay | Admitting: Family Medicine

## 2013-05-29 ENCOUNTER — Other Ambulatory Visit (INDEPENDENT_AMBULATORY_CARE_PROVIDER_SITE_OTHER): Payer: 59

## 2013-05-29 DIAGNOSIS — E119 Type 2 diabetes mellitus without complications: Secondary | ICD-10-CM

## 2013-05-29 LAB — BASIC METABOLIC PANEL
BUN: 9 mg/dL (ref 6–23)
CHLORIDE: 101 meq/L (ref 96–112)
CO2: 28 meq/L (ref 19–32)
Calcium: 9 mg/dL (ref 8.4–10.5)
Creatinine, Ser: 0.7 mg/dL (ref 0.4–1.5)
GFR: 132.51 mL/min (ref >60.00)
Glucose, Bld: 160 mg/dL — ABNORMAL HIGH (ref 70–99)
POTASSIUM: 3.7 meq/L (ref 3.5–5.1)
SODIUM: 136 meq/L (ref 135–145)

## 2013-05-29 LAB — MICROALBUMIN / CREATININE URINE RATIO
CREATININE, U: 23.9 mg/dL
MICROALB UR: 0.3 mg/dL (ref 0.0–1.9)
MICROALB/CREAT RATIO: 1.3 mg/g (ref 0.0–30.0)

## 2013-05-29 LAB — HEMOGLOBIN A1C: HEMOGLOBIN A1C: 7.3 % — AB (ref 4.6–6.5)

## 2013-06-18 ENCOUNTER — Encounter: Payer: Self-pay | Admitting: Family Medicine

## 2013-06-18 ENCOUNTER — Ambulatory Visit (INDEPENDENT_AMBULATORY_CARE_PROVIDER_SITE_OTHER): Payer: 59 | Admitting: Family Medicine

## 2013-06-18 VITALS — BP 160/90 | Temp 98.5°F | Wt 157.0 lb

## 2013-06-18 DIAGNOSIS — E1159 Type 2 diabetes mellitus with other circulatory complications: Secondary | ICD-10-CM

## 2013-06-18 DIAGNOSIS — E119 Type 2 diabetes mellitus without complications: Secondary | ICD-10-CM

## 2013-06-18 DIAGNOSIS — E1169 Type 2 diabetes mellitus with other specified complication: Secondary | ICD-10-CM

## 2013-06-18 DIAGNOSIS — I10 Essential (primary) hypertension: Secondary | ICD-10-CM

## 2013-06-18 DIAGNOSIS — E785 Hyperlipidemia, unspecified: Secondary | ICD-10-CM

## 2013-06-18 MED ORDER — RAMIPRIL 10 MG PO CAPS: 10.0000 mg | ORAL_CAPSULE | Freq: Every day | ORAL | Status: DC

## 2013-06-18 NOTE — Progress Notes (Signed)
   Subjective:    Patient ID: William Nixon, male    DOB: 1960-07-20, 53 y.o.   MRN: [id number]  HPI William Nixon. is a 53 year old male nonsmoker who comes in today for followup of hypertension and diabetes  His blood sugar today was 114. 2 weeks ago was 160. This A1c is 7.3%. Last summer was 6.8. He's compliant with his medication diet and exercise. Weight is 157 pounds he works out daily at [employer]  BP in the last couple weeks has been running 597 the 416 systolic diastolic normal. He's been compliant with his 5 mg of Ramapo daily    Review of Systems Negative    Objective:   Physical Exam  Well-developed well-nourished male no acute distress vital signs stable he's afebrile BP right arm sitting position 160/90      Assessment & Plan:  Diabetes at goal continue current therapy followup in June  Hypertension not at goal.......... increase Prempro to 10 mg daily BP check daily followup in 4 weeks if not at goal  Hyperlipidemia DC niacin

## 2013-06-18 NOTE — Patient Instructions (Signed)
Stopped the niacin  Continue your current medications and diet and exercise for your diabetes  Increase her Avapro 10 mg daily and check your blood pressure daily in the morning. 6 x 4 weeks your blood pressure should be at goal systolic less than 111 diastolic less than 85. ,,,,,,,,,, if not at goal after one month call me  Set up your annual physical examination for the second week in June .......Marland Kitchen

## 2013-06-18 NOTE — Progress Notes (Signed)
Pre visit review using our clinic review tool, if applicable. No additional management support is needed unless otherwise documented below in the visit note. 

## 2013-06-19 ENCOUNTER — Telehealth: Payer: Self-pay | Admitting: Family Medicine

## 2013-06-19 NOTE — Telephone Encounter (Signed)
Relevant patient education assigned to patient using Emmi. ° °

## 2013-06-20 ENCOUNTER — Telehealth: Payer: Self-pay

## 2013-06-20 NOTE — Telephone Encounter (Signed)
Relevant patient education assigned to patient using Emmi. ° °

## 2013-06-28 ENCOUNTER — Encounter: Payer: Self-pay | Admitting: Internal Medicine

## 2013-07-16 ENCOUNTER — Encounter: Payer: Self-pay | Admitting: Internal Medicine

## 2013-07-22 ENCOUNTER — Ambulatory Visit (INDEPENDENT_AMBULATORY_CARE_PROVIDER_SITE_OTHER): Payer: 59 | Admitting: Family Medicine

## 2013-07-22 ENCOUNTER — Encounter: Payer: Self-pay | Admitting: Family Medicine

## 2013-07-22 VITALS — BP 180/90 | Temp 98.4°F | Wt 154.0 lb

## 2013-07-22 DIAGNOSIS — E1169 Type 2 diabetes mellitus with other specified complication: Secondary | ICD-10-CM

## 2013-07-22 DIAGNOSIS — E1159 Type 2 diabetes mellitus with other circulatory complications: Secondary | ICD-10-CM

## 2013-07-22 DIAGNOSIS — I10 Essential (primary) hypertension: Secondary | ICD-10-CM

## 2013-07-22 DIAGNOSIS — E785 Hyperlipidemia, unspecified: Secondary | ICD-10-CM

## 2013-07-22 DIAGNOSIS — E119 Type 2 diabetes mellitus without complications: Secondary | ICD-10-CM

## 2013-07-22 MED ORDER — RAMIPRIL 10 MG PO CAPS: ORAL_CAPSULE | ORAL | Status: DC

## 2013-07-22 NOTE — Patient Instructions (Signed)
Ramapo 10 mg....... 2 tablets daily in the morning  Check your blood pressure daily in the morning  Return in one month for followup  Tylenol........ 2 tabs 3 times daily for chest wall pain

## 2013-07-22 NOTE — Progress Notes (Signed)
Pre visit review using our clinic review tool, if applicable. No additional management support is needed unless otherwise documented below in the visit note. 

## 2013-07-22 NOTE — Progress Notes (Signed)
   Subjective:    Patient ID: William Nixon, male    DOB: 01-29-1961, 53 y.o.   MRN: [id number]  HPI PK is a delightful 53 year old married male nonsmoker who comes in today for evaluation of hypertension and chest wall pain  We increased his Pravachol at 10 mg a month ago because his blood pressure was elevated. It hasn't dropped much. At home is running 150/90  Yesterday after a workout he noticed some discomfort in the left upper anterior chest wall muscle. It hurts when he moves his shoulder. No cardiac or pulmonary symptoms. The discomfort is constant dull ache.   Review of Systems    review of systems negative Objective:   Physical Exam  Well-developed well-nourished male no acute distress vital signs stable he is afebrile cardiopulmonary exam normal chest wall tenderness to palpation left pectoralis  BP 180/90      Assessment & Plan:  Hypertension not at goal increase Ramapo the 10 mg twice a day  Chest wall pain reassured

## 2013-07-25 ENCOUNTER — Telehealth: Payer: Self-pay | Admitting: Family Medicine

## 2013-07-25 ENCOUNTER — Ambulatory Visit (AMBULATORY_SURGERY_CENTER): Payer: Self-pay

## 2013-07-25 VITALS — Ht 68.0 in | Wt 154.6 lb

## 2013-07-25 DIAGNOSIS — Z1211 Encounter for screening for malignant neoplasm of colon: Secondary | ICD-10-CM

## 2013-07-25 MED ORDER — RAMIPRIL 5 MG PO CAPS: ORAL_CAPSULE | ORAL | Status: DC

## 2013-07-25 MED ORDER — RAMIPRIL 10 MG PO CAPS: ORAL_CAPSULE | ORAL | Status: DC

## 2013-07-25 MED ORDER — SUPREP BOWEL PREP KIT 17.5-3.13-1.6 GM/177ML PO SOLN: 1.0000 | Freq: Once | ORAL | Status: DC

## 2013-07-25 NOTE — Telephone Encounter (Signed)
Pt want to know if his ramipril (ALTACE) 10 MG capsule can be 15 mg instead of 20   Pt said med was increased  pt cell [phone]

## 2013-07-25 NOTE — Telephone Encounter (Signed)
Spoke with patient.  Okay per Dr Sherren Mocha. Rx sent to pharmacy.

## 2013-07-30 ENCOUNTER — Telehealth: Payer: Self-pay | Admitting: *Deleted

## 2013-07-30 MED ORDER — ONETOUCH DELICA LANCETS 33G MISC: 1.0000 | Freq: Every day | Status: DC

## 2013-07-30 MED ORDER — GLUCOSE BLOOD VI STRP: ORAL_STRIP | Status: DC

## 2013-07-30 NOTE — Telephone Encounter (Signed)
Insurance will no longer cover Accu Chek.  Patient is aware and will pick up a One Touch glucometer.

## 2013-08-02 ENCOUNTER — Ambulatory Visit (AMBULATORY_SURGERY_CENTER): Payer: 59 | Admitting: Internal Medicine

## 2013-08-02 ENCOUNTER — Encounter: Payer: Self-pay | Admitting: Internal Medicine

## 2013-08-02 VITALS — BP 109/61 | HR 54 | Temp 98.8°F | Resp 24 | Ht 68.0 in | Wt 154.0 lb

## 2013-08-02 DIAGNOSIS — Z1211 Encounter for screening for malignant neoplasm of colon: Secondary | ICD-10-CM

## 2013-08-02 DIAGNOSIS — D126 Benign neoplasm of colon, unspecified: Secondary | ICD-10-CM

## 2013-08-02 LAB — GLUCOSE, CAPILLARY
GLUCOSE-CAPILLARY: 156 mg/dL — AB (ref 70–99)
Glucose-Capillary: 136 mg/dL — ABNORMAL HIGH (ref 70–99)

## 2013-08-02 MED ORDER — SODIUM CHLORIDE 0.9 % IV SOLN: 500.0000 mL | INTRAVENOUS | Status: DC

## 2013-08-02 NOTE — Op Note (Signed)
[place]  [address]. [place], [id number]   COLONOSCOPY PROCEDURE REPORT  PATIENT: William, Nixon  MR#: [id number] BIRTHDATE: 12/12/1960 , 52  yrs. old GENDER: Male ENDOSCOPIST: Gatha Mayer, MD, FACG REFERRED VO:HYWVPXT Campbell, FNP-BC PROCEDURE DATE:  08/02/2013 PROCEDURE:   Colonoscopy with biopsy First Screening Colonoscopy - Avg.  risk and is 50 yrs.  old or older Yes.  Prior Negative Screening - Now for repeat screening. N/A  History of Adenoma - Now for follow-up colonoscopy & has been > or = to 3 yrs.  N/A  Polyps Removed Today? Yes. ASA CLASS:   Class III INDICATIONS:average risk screening and first colonoscopy. MEDICATIONS: propofol (Diprivan) 300mg  IV, MAC sedation, administered by CRNA, and These medications were titrated to patient response per physician's verbal order  DESCRIPTION OF PROCEDURE:   After the risks benefits and alternatives of the procedure were thoroughly explained, informed consent was obtained.  A digital rectal exam revealed no abnormalities of the rectum, A digital rectal exam revealed the prostate was not enlarged, and A digital rectal exam revealed no prostatic nodules.   The [id number]  endoscope was introduced through the anus and advanced to the cecum, which was identified by both the appendix and ileocecal valve. No adverse events experienced.   The quality of the prep was excellent using Suprep  The instrument was then slowly withdrawn as the colon was fully examined.  COLON FINDINGS: Three diminutive sessile polyps were found in the transverse colon.  A polypectomy was performed with cold forceps. The resection was complete and the polyp tissue was completely retrieved.   The colon mucosa was otherwise normal.   A right colon retroflexion was performed.  Retroflexed views revealed no abnormalities. The time to cecum=1 minutes 39 seconds.  Withdrawal time=16 minutes 15 seconds.  The scope was  withdrawn and the procedure completed. COMPLICATIONS: There were no complications.  ENDOSCOPIC IMPRESSION: 1.   Three diminutive sessile polyps were found in the transverse colon; polypectomy was performed with cold forceps 2.   The colon mucosa was otherwise normal - excellent prep - first colonoscopy  RECOMMENDATIONS: Timing of repeat colonoscopy will be determined by pathology findings.   eSigned:  Gatha Mayer, MD, [facility] 08/02/2013 9:16 AM   cc: Roxy Cedar FNP and The Patient

## 2013-08-02 NOTE — Patient Instructions (Signed)
YOU HAD AN ENDOSCOPIC PROCEDURE TODAY AT THE [facility]: Refer to the procedure report that was given to you for any specific questions about what was found during the examination.  If the procedure report does not answer your questions, please call your gastroenterologist to clarify.  If you requested that your care partner not be given the details of your procedure findings, then the procedure report has been included in a sealed envelope for you to review at your convenience later.  YOU SHOULD EXPECT: Some feelings of bloating in the abdomen. Passage of more gas than usual.  Walking can help get rid of the air that was put into your GI tract during the procedure and reduce the bloating. If you had a lower endoscopy (such as a colonoscopy or flexible sigmoidoscopy) you may notice spotting of blood in your stool or on the toilet paper. If you underwent a bowel prep for your procedure, then you may not have a normal bowel movement for a few days.  DIET: Your first meal following the procedure should be a light meal and then it is ok to progress to your normal diet.  A half-sandwich or bowl of soup is an example of a good first meal.  Heavy or fried foods are harder to digest and may make you feel nauseous or bloated.  Likewise meals heavy in dairy and vegetables can cause extra gas to form and this can also increase the bloating.  Drink plenty of fluids but you should avoid alcoholic beverages for 24 hours.  ACTIVITY: Your care partner should take you home directly after the procedure.  You should plan to take it easy, moving slowly for the rest of the day.  You can resume normal activity the day after the procedure however you should NOT DRIVE or use heavy machinery for 24 hours (because of the sedation medicines used during the test).    SYMPTOMS TO REPORT IMMEDIATELY: A gastroenterologist can be reached at any hour.  During normal business hours, 8:30 AM to 5:00 PM Monday through Friday,  call [phone].  After hours and on weekends, please call the GI answering service at [phone] who will take a message and have the physician on call contact you.   Following lower endoscopy (colonoscopy or flexible sigmoidoscopy):  Excessive amounts of blood in the stool  Significant tenderness or worsening of abdominal pains  Swelling of the abdomen that is new, acute  Fever of 100F or higher    FOLLOW UP: If any biopsies were taken you will be contacted by phone or by letter within the next 1-3 weeks.  Call your gastroenterologist if you have not heard about the biopsies in 3 weeks.  Our staff will call the home number listed on your records the next business day following your procedure to check on you and address any questions or concerns that you may have at that time regarding the information given to you following your procedure. This is a courtesy call and so if there is no answer at the home number and we have not heard from you through the emergency physician on call, we will assume that you have returned to your regular daily activities without incident.  SIGNATURES/CONFIDENTIALITY: You and/or your care partner have signed paperwork which will be entered into your electronic medical record.  These signatures attest to the fact that that the information above on your After Visit Summary has been reviewed and is understood.  Full responsibility of the confidentiality   of this discharge information lies with you and/or your care-partner.   Polyp information given.  Dr. Carlean Purl will let you know in a letter about pathology of polyps and when to repeat this test..

## 2013-08-02 NOTE — Progress Notes (Signed)
Called to room to assist during endoscopic procedure.  Patient ID and intended procedure confirmed with present staff. Received instructions for my participation in the procedure from the performing physician.  

## 2013-08-02 NOTE — Progress Notes (Signed)
Report to pacu rn, vss, bbs=clear 

## 2013-08-05 ENCOUNTER — Telehealth: Payer: Self-pay | Admitting: *Deleted

## 2013-08-05 NOTE — Telephone Encounter (Signed)
  Follow up Call-  Call back number 08/02/2013  Post procedure Call Back phone  # [phone]  Permission to leave phone message Yes     Patient questions:  Do you have a fever, pain , or abdominal swelling? no Pain Score  0 *  Have you tolerated food without any problems? yes  Have you been able to return to your normal activities? no  Do you have any questions about your discharge instructions: Diet   no Medications  no Follow up visit  no  Do you have questions or concerns about your Care? no  Actions: * If pain score is 4 or above: No action needed, pain <4.

## 2013-08-06 ENCOUNTER — Encounter: Payer: Self-pay | Admitting: Internal Medicine

## 2013-08-06 DIAGNOSIS — Z860100 Personal history of colon polyps, unspecified: Secondary | ICD-10-CM

## 2013-08-06 HISTORY — DX: Personal history of colon polyps, unspecified: Z86.0100

## 2013-08-06 HISTORY — DX: Personal history of colonic polyps: Z86.010

## 2013-08-06 NOTE — Progress Notes (Signed)
Quick Note:  3 diminutive adenomas - repeat colon 2018 ______

## 2013-08-12 ENCOUNTER — Other Ambulatory Visit: Payer: Self-pay | Admitting: *Deleted

## 2013-08-12 MED ORDER — GLUCOSE BLOOD VI STRP: ORAL_STRIP | Status: DC

## 2013-08-15 ENCOUNTER — Encounter: Payer: Self-pay | Admitting: Internal Medicine

## 2013-10-02 ENCOUNTER — Other Ambulatory Visit (INDEPENDENT_AMBULATORY_CARE_PROVIDER_SITE_OTHER): Payer: 59

## 2013-10-02 DIAGNOSIS — E1169 Type 2 diabetes mellitus with other specified complication: Secondary | ICD-10-CM

## 2013-10-02 DIAGNOSIS — E119 Type 2 diabetes mellitus without complications: Secondary | ICD-10-CM

## 2013-10-02 DIAGNOSIS — E1159 Type 2 diabetes mellitus with other circulatory complications: Secondary | ICD-10-CM

## 2013-10-02 DIAGNOSIS — I10 Essential (primary) hypertension: Secondary | ICD-10-CM

## 2013-10-02 DIAGNOSIS — E785 Hyperlipidemia, unspecified: Secondary | ICD-10-CM

## 2013-10-02 LAB — POCT URINALYSIS DIPSTICK
BILIRUBIN UA: NEGATIVE
Glucose, UA: NEGATIVE
Ketones, UA: NEGATIVE
Leukocytes, UA: NEGATIVE
Nitrite, UA: NEGATIVE
PH UA: 7
Protein, UA: NEGATIVE
RBC UA: NEGATIVE
Spec Grav, UA: 1.01
Urobilinogen, UA: 0.2

## 2013-10-02 LAB — PSA: PSA: 1.18 ng/mL (ref 0.10–4.00)

## 2013-10-02 LAB — BASIC METABOLIC PANEL
BUN: 9 mg/dL (ref 6–23)
CO2: 30 mEq/L (ref 19–32)
Calcium: 9.3 mg/dL (ref 8.4–10.5)
Chloride: 100 mEq/L (ref 96–112)
Creatinine, Ser: 0.7 mg/dL (ref 0.4–1.5)
GFR: 121.79 mL/min (ref >60.00)
GLUCOSE: 148 mg/dL — AB (ref 70–99)
POTASSIUM: 4.5 meq/L (ref 3.5–5.1)
Sodium: 136 mEq/L (ref 135–145)

## 2013-10-02 LAB — CBC WITH DIFFERENTIAL/PLATELET
BASOS ABS: 0 10*3/uL (ref 0.0–0.1)
Basophils Relative: 0.4 % (ref 0.0–3.0)
EOS ABS: 0.1 10*3/uL (ref 0.0–0.7)
Eosinophils Relative: 2.9 % (ref 0.0–5.0)
HEMATOCRIT: 41.2 % (ref 39.0–52.0)
Hemoglobin: 14.3 g/dL (ref 13.0–17.0)
LYMPHS ABS: 1.4 10*3/uL (ref 0.7–4.0)
Lymphocytes Relative: 31.3 % (ref 12.0–46.0)
MCHC: 34.8 g/dL (ref 30.0–36.0)
MCV: 88.9 fl (ref 78.0–100.0)
MONO ABS: 0.3 10*3/uL (ref 0.1–1.0)
MONOS PCT: 7.2 % (ref 3.0–12.0)
NEUTROS ABS: 2.6 10*3/uL (ref 1.4–7.7)
Neutrophils Relative %: 58.2 % (ref 43.0–77.0)
Platelets: 153 10*3/uL (ref 150.0–400.0)
RBC: 4.63 Mil/uL (ref 4.22–5.81)
RDW: 13 % (ref 11.5–15.5)
WBC: 4.5 10*3/uL (ref 4.0–10.5)

## 2013-10-02 LAB — MICROALBUMIN / CREATININE URINE RATIO
Creatinine,U: 53.9 mg/dL
MICROALB UR: 0.3 mg/dL (ref 0.0–1.9)
Microalb Creat Ratio: 0.6 mg/g (ref 0.0–30.0)

## 2013-10-02 LAB — HEMOGLOBIN A1C: Hgb A1c MFr Bld: 7.1 % — ABNORMAL HIGH (ref 4.6–6.5)

## 2013-10-02 LAB — HEPATIC FUNCTION PANEL
ALK PHOS: 62 U/L (ref 39–117)
ALT: 22 U/L (ref 0–53)
AST: 24 U/L (ref 0–37)
Albumin: 4.6 g/dL (ref 3.5–5.2)
BILIRUBIN TOTAL: 1.3 mg/dL — AB (ref 0.2–1.2)
Bilirubin, Direct: 0.2 mg/dL (ref 0.0–0.3)
Total Protein: 7.1 g/dL (ref 6.0–8.3)

## 2013-10-02 LAB — LIPID PANEL
CHOLESTEROL: 179 mg/dL (ref 0–200)
HDL: 28.1 mg/dL — ABNORMAL LOW (ref >39.00)
LDL Cholesterol: 98 mg/dL (ref 0–99)
NonHDL: 150.9
Total CHOL/HDL Ratio: 6
Triglycerides: 264 mg/dL — ABNORMAL HIGH (ref 0.0–149.0)
VLDL: 52.8 mg/dL — ABNORMAL HIGH (ref 0.0–40.0)

## 2013-10-02 LAB — TSH: TSH: 5.54 u[IU]/mL — ABNORMAL HIGH (ref 0.35–4.50)

## 2013-10-09 ENCOUNTER — Ambulatory Visit (INDEPENDENT_AMBULATORY_CARE_PROVIDER_SITE_OTHER): Payer: 59 | Admitting: Family Medicine

## 2013-10-09 ENCOUNTER — Encounter: Payer: Self-pay | Admitting: Family Medicine

## 2013-10-09 VITALS — BP 150/80 | Temp 98.3°F | Ht 67.5 in | Wt 154.0 lb

## 2013-10-09 DIAGNOSIS — I152 Hypertension secondary to endocrine disorders: Secondary | ICD-10-CM

## 2013-10-09 DIAGNOSIS — I10 Essential (primary) hypertension: Secondary | ICD-10-CM

## 2013-10-09 DIAGNOSIS — E1169 Type 2 diabetes mellitus with other specified complication: Secondary | ICD-10-CM

## 2013-10-09 DIAGNOSIS — E119 Type 2 diabetes mellitus without complications: Secondary | ICD-10-CM

## 2013-10-09 DIAGNOSIS — Z86010 Personal history of colonic polyps: Secondary | ICD-10-CM

## 2013-10-09 DIAGNOSIS — E785 Hyperlipidemia, unspecified: Secondary | ICD-10-CM

## 2013-10-09 DIAGNOSIS — E1159 Type 2 diabetes mellitus with other circulatory complications: Secondary | ICD-10-CM

## 2013-10-09 MED ORDER — RAMIPRIL 10 MG PO CAPS: ORAL_CAPSULE | ORAL | Status: DC

## 2013-10-09 MED ORDER — ONETOUCH DELICA LANCETS 33G MISC: 1.0000 | Freq: Every day | Status: DC

## 2013-10-09 MED ORDER — GLIPIZIDE ER 10 MG PO TB24: ORAL_TABLET | ORAL | Status: DC

## 2013-10-09 MED ORDER — METFORMIN HCL 1000 MG PO TABS: ORAL_TABLET | ORAL | Status: DC

## 2013-10-09 NOTE — Progress Notes (Signed)
Pre visit review using our clinic review tool, if applicable. No additional management support is needed unless otherwise documented below in the visit note. 

## 2013-10-09 NOTE — Progress Notes (Signed)
   Subjective:    Patient ID: William Nixon, male    DOB: [birth year], 53 y.o.   MRN: [id number]  HPI laronn is a 53 year old married male nonsmoker who comes in today for annual evaluation because of diabetes type 2, hypertension,  His blood sugars in the 120 range to 1:30. A1c recently 7.1%  His blood pressure on Altace 10 mg daily at home is 120/80.  He gets routine eye care, dental care, colonoscopies because of a history of colon polyps.  Vaccinations updated by Apolonio Schneiders   Review of Systems  Constitutional: Negative.   HENT: Negative.   Eyes: Negative.   Respiratory: Negative.   Cardiovascular: Negative.   Gastrointestinal: Negative.   Genitourinary: Negative.   Musculoskeletal: Negative.   Skin: Negative.   Neurological: Negative.   Psychiatric/Behavioral: Negative.        Objective:   Physical Exam  Nursing note and vitals reviewed. Constitutional: He is oriented to person, place, and time. He appears well-developed and well-nourished.  HENT:  Head: Normocephalic and atraumatic.  Right Ear: External ear normal.  Left Ear: External ear normal.  Nose: Nose normal.  Mouth/Throat: Oropharynx is clear and moist.  Eyes: Conjunctivae and EOM are normal. Pupils are equal, round, and reactive to light.  Neck: Normal range of motion. Neck supple. No JVD present. No tracheal deviation present. No thyromegaly present.  Cardiovascular: Normal rate, regular rhythm, normal heart sounds and intact distal pulses.  Exam reveals no gallop and no friction rub.   No murmur heard. Pulmonary/Chest: Effort normal and breath sounds normal. No stridor. No respiratory distress. He has no wheezes. He has no rales. He exhibits no tenderness.  Abdominal: Soft. Bowel sounds are normal. He exhibits no distension and no mass. There is no tenderness. There is no rebound and no guarding.  Genitourinary: Rectum normal, prostate normal and penis normal. Guaiac negative stool. No penile  tenderness.  Musculoskeletal: Normal range of motion. He exhibits no edema and no tenderness.  Lymphadenopathy:    He has no cervical adenopathy.  Neurological: He is alert and oriented to person, place, and time. He has normal reflexes. No cranial nerve deficit. He exhibits normal muscle tone.  Skin: Skin is warm and dry. No rash noted. No erythema. No pallor.  Psychiatric: He has a normal mood and affect. His behavior is normal. Judgment and thought content normal.          Assessment & Plan:  Healthy male  Diabetes type 2 at goal continue current therapy  Hypertension at goal continue current therapy

## 2013-10-09 NOTE — Patient Instructions (Signed)
Metformin 1000 mg.......Marland Kitchen 1 tab in the morning with breakfast and a half a tablet with 3 male  Continue the glipizide 10 mg twice daily  Followup in 6 months  Nonfasting labs.......Marland Kitchen 11:30...Marland KitchenMarland KitchenMarland Kitchen or 4:30....... one week prior

## 2013-12-27 ENCOUNTER — Telehealth: Payer: Self-pay | Admitting: Family Medicine

## 2013-12-27 NOTE — Telephone Encounter (Signed)
William Nixon talked with pt and told pt we needed to manage his diabetes.  Pt agreed to keep appt

## 2013-12-27 NOTE — Telephone Encounter (Signed)
William Nixon pt would like for you to call him back. He has some question about why he coming back in so soon for an appt when he was not scheduled to see the doctor until Dec.

## 2014-01-15 ENCOUNTER — Other Ambulatory Visit (INDEPENDENT_AMBULATORY_CARE_PROVIDER_SITE_OTHER): Payer: 59

## 2014-01-15 DIAGNOSIS — E119 Type 2 diabetes mellitus without complications: Secondary | ICD-10-CM

## 2014-01-15 LAB — HEMOGLOBIN A1C: Hgb A1c MFr Bld: 7.3 % — ABNORMAL HIGH (ref 4.6–6.5)

## 2014-01-15 LAB — BASIC METABOLIC PANEL
BUN: 12 mg/dL (ref 6–23)
CHLORIDE: 99 meq/L (ref 96–112)
CO2: 28 mEq/L (ref 19–32)
Calcium: 9.2 mg/dL (ref 8.4–10.5)
Creatinine, Ser: 0.8 mg/dL (ref 0.4–1.5)
GFR: 109.3 mL/min (ref >60.00)
Glucose, Bld: 160 mg/dL — ABNORMAL HIGH (ref 70–99)
POTASSIUM: 4.2 meq/L (ref 3.5–5.1)
SODIUM: 134 meq/L — AB (ref 135–145)

## 2014-01-20 ENCOUNTER — Ambulatory Visit (INDEPENDENT_AMBULATORY_CARE_PROVIDER_SITE_OTHER): Payer: 59 | Admitting: Family Medicine

## 2014-01-20 ENCOUNTER — Encounter: Payer: Self-pay | Admitting: Family Medicine

## 2014-01-20 VITALS — BP 164/90 | Temp 98.0°F | Wt 156.0 lb

## 2014-01-20 DIAGNOSIS — I10 Essential (primary) hypertension: Secondary | ICD-10-CM

## 2014-01-20 DIAGNOSIS — E119 Type 2 diabetes mellitus without complications: Secondary | ICD-10-CM

## 2014-01-20 DIAGNOSIS — E1159 Type 2 diabetes mellitus with other circulatory complications: Secondary | ICD-10-CM

## 2014-01-20 DIAGNOSIS — E1169 Type 2 diabetes mellitus with other specified complication: Secondary | ICD-10-CM

## 2014-01-20 NOTE — Patient Instructions (Signed)
Check your blood pressure daily in the morning  Increase the metformin 1000 mg twice a day  Continue glipizide 10 mg twice a day  Continue your good diet and exercise program  Followup in 2 months........ during arrector all your blood sugar and blood pressure readings

## 2014-01-20 NOTE — Progress Notes (Signed)
Pre visit review using our clinic review tool, if applicable. No additional management support is needed unless otherwise documented below in the visit note. 

## 2014-01-20 NOTE — Progress Notes (Signed)
Lab Results  Component Value Date   HGBA1C 7.3* 01/15/2014   HGBA1C 7.1* 10/02/2013   HGBA1C 7.3* 05/29/2013   Lab Results  Component Value Date   MICROALBUR 0.3 10/02/2013   LDLCALC 98 10/02/2013   CREATININE 0.8 01/15/2014

## 2014-01-20 NOTE — Progress Notes (Signed)
   Subjective:    Patient ID: William Nixon, male    DOB: 11/25/1960, 53 y.o.   MRN: [id number]  HPI Ms. soffer is a 53 year old male nonsmoker who comes in today for followup of 2 issues  His A1c is 7.3 with a fasting blood sugar of 161 week ago. He's on metformin 1000 mg in the morning and 500 prior to his evening male and glipizide 10 mg twice a day. His weight is good 156 he exercises on a regular basis  BP today right arm sitting position 140/80. ,,,,,,,,,,,, blood pressure goal systolic less than 993 diastolic less than 85   Review of Systems    review of systems otherwise negative Objective:   Physical Exam  Well-developed well-nourished male no acute distress vital signs stable he is afebrile BP right arm sitting position 140/80      Assessment & Plan:  Diabetes type 2,,,,,,,, not at goal,,,,,,,, increase metformin 1000 mg twice a day followup in 3 months  Hypertension not at goal,,,,,,,, BP check every morning followup in 3 months,,,,, hopefully when blood sugar drops to normal blood pressure we'll then drop less than 716 systolic,,

## 2014-03-04 LAB — HM DIABETES EYE EXAM

## 2014-03-25 ENCOUNTER — Encounter: Payer: Self-pay | Admitting: Family Medicine

## 2014-03-25 ENCOUNTER — Telehealth: Payer: Self-pay | Admitting: Family Medicine

## 2014-03-25 DIAGNOSIS — E119 Type 2 diabetes mellitus without complications: Secondary | ICD-10-CM

## 2014-03-25 NOTE — Telephone Encounter (Signed)
Pt had an appt on 04/16/14 and it was cancel because he came in Sept. He had an appt on 03/27/14 and rescheduled for April 2016 because he said he came in Sept. Does pt need to come in Dec or April and if so we need labs put in.

## 2014-03-25 NOTE — Telephone Encounter (Signed)
Patient should be seen in January to follow up with diabetes and hypertension Labs ordered Please call and schedule patient's appointment

## 2014-03-27 ENCOUNTER — Ambulatory Visit: Payer: 59 | Admitting: Family Medicine

## 2014-03-27 NOTE — Telephone Encounter (Signed)
lmovm to call back and schedule  °

## 2014-03-27 NOTE — Telephone Encounter (Signed)
Pt has been scheduled.  °

## 2014-04-09 ENCOUNTER — Other Ambulatory Visit: Payer: 59

## 2014-04-16 ENCOUNTER — Ambulatory Visit: Payer: 59 | Admitting: Family Medicine

## 2014-04-30 ENCOUNTER — Other Ambulatory Visit (INDEPENDENT_AMBULATORY_CARE_PROVIDER_SITE_OTHER): Payer: 59

## 2014-04-30 DIAGNOSIS — E119 Type 2 diabetes mellitus without complications: Secondary | ICD-10-CM

## 2014-04-30 LAB — BASIC METABOLIC PANEL
BUN: 9 mg/dL (ref 6–23)
CALCIUM: 9.3 mg/dL (ref 8.4–10.5)
CO2: 27 meq/L (ref 19–32)
CREATININE: 0.74 mg/dL (ref 0.40–1.50)
Chloride: 100 mEq/L (ref 96–112)
GFR: 117.73 mL/min (ref >60.00)
Glucose, Bld: 161 mg/dL — ABNORMAL HIGH (ref 70–99)
Potassium: 3.9 mEq/L (ref 3.5–5.1)
Sodium: 134 mEq/L — ABNORMAL LOW (ref 135–145)

## 2014-04-30 LAB — HEMOGLOBIN A1C: Hgb A1c MFr Bld: 7.2 % — ABNORMAL HIGH (ref 4.6–6.5)

## 2014-04-30 LAB — MICROALBUMIN / CREATININE URINE RATIO
CREATININE, U: 27.5 mg/dL
MICROALB/CREAT RATIO: 0 mg/g (ref 0.0–30.0)
Microalb, Ur: 0 mg/dL (ref 0.0–1.9)

## 2014-05-05 ENCOUNTER — Ambulatory Visit: Payer: 59 | Admitting: Family Medicine

## 2014-05-12 ENCOUNTER — Encounter: Payer: Self-pay | Admitting: Family Medicine

## 2014-05-12 ENCOUNTER — Ambulatory Visit (INDEPENDENT_AMBULATORY_CARE_PROVIDER_SITE_OTHER): Payer: 59 | Admitting: Family Medicine

## 2014-05-12 VITALS — BP 160/80 | Temp 98.0°F | Wt 154.0 lb

## 2014-05-12 DIAGNOSIS — E1169 Type 2 diabetes mellitus with other specified complication: Secondary | ICD-10-CM

## 2014-05-12 DIAGNOSIS — I10 Essential (primary) hypertension: Secondary | ICD-10-CM

## 2014-05-12 DIAGNOSIS — E1159 Type 2 diabetes mellitus with other circulatory complications: Secondary | ICD-10-CM

## 2014-05-12 MED ORDER — LOSARTAN POTASSIUM 50 MG PO TABS: 50.0000 mg | ORAL_TABLET | Freq: Every day | ORAL | Status: DC

## 2014-05-12 MED ORDER — PREDNISONE 20 MG PO TABS: ORAL_TABLET | ORAL | Status: DC

## 2014-05-12 NOTE — Patient Instructions (Signed)
Continue oral agents for now  Continue diet and exercise  We will get you set up with a consult with the endocrinologist  Stop the Altace  Start Cozaar 50 mg.......Marland Kitchen 1 daily in the morning

## 2014-05-12 NOTE — Progress Notes (Signed)
   Subjective:    Patient ID: William Nixon, male    DOB: Apr 21, 1960, 54 y.o.   MRN: [id number]  HPI P is a 54 year old male married nonsmoker who comes in today for follow-up of diabetes and hypertension  He's on max doses of glipizide 10 mg twice a day and metformin 1000 mg twice a day. His weight is excellent 154 pounds. He exercises on a regular basis.  A year ago his A1c was 6.8 over the past year we seem to go up to 7.2%  Also recent fasting blood sugar 166.  His Altase is 15 mg daily but his blood pressures beginning to run a little on the high side 779 systolic. This may be because his blood sugars elevated.  Kela Millin long discussion the various options. I recommend he see Dr. Lorie Apley for consult to discuss them   Review of Systems    review of systems otherwise negative Objective:   Physical Exam  Well-developed well-nourished male no acute distress vital signs stable he is afebrile BP today 160/80      Assessment & Plan:  Diabetes not at goal with A1c of 7.2% and fasting blood sugar 166....... consult with Dr. Lorie Apley  Hypertension not at goal..... Stop Altase......... switch to Cozaar

## 2014-05-12 NOTE — Progress Notes (Signed)
Pre visit review using our clinic review tool, if applicable. No additional management support is needed unless otherwise documented below in the visit note. 

## 2014-05-27 ENCOUNTER — Ambulatory Visit (INDEPENDENT_AMBULATORY_CARE_PROVIDER_SITE_OTHER): Payer: 59 | Admitting: Internal Medicine

## 2014-05-27 ENCOUNTER — Encounter: Payer: Self-pay | Admitting: Internal Medicine

## 2014-05-27 VITALS — BP 138/72 | HR 81 | Temp 97.7°F | Resp 12 | Ht 68.0 in | Wt 154.8 lb

## 2014-05-27 DIAGNOSIS — E119 Type 2 diabetes mellitus without complications: Secondary | ICD-10-CM

## 2014-05-27 NOTE — Patient Instructions (Signed)
Please continue Metformin 1000 mg 2x a day. Continue Glipizide XL 10 mg 2x a day before meals.  Please return in 3 months with your sugar log.   PATIENT INSTRUCTIONS FOR TYPE 2 DIABETES:  DIET AND EXERCISE Diet and exercise is an important part of diabetic treatment.  We recommended aerobic exercise in the form of brisk walking (working between 40-60% of maximal aerobic capacity, similar to brisk walking) for 150 minutes per week (such as 30 minutes five days per week) along with 3 times per week performing 'resistance' training (using various gauge rubber tubes with handles) 5-10 exercises involving the major muscle groups (upper body, lower body and core) performing 10-15 repetitions (or near fatigue) each exercise. Start at half the above goal but build slowly to reach the above goals. If limited by weight, joint pain, or disability, we recommend daily walking in a swimming pool with water up to waist to reduce pressure from joints while allow for adequate exercise.    BLOOD GLUCOSES Monitoring your blood glucoses is important for continued management of your diabetes. Please check your blood glucoses 2-4 times a day: fasting, before meals and at bedtime (you can rotate these measurements - e.g. one day check before the 3 meals, the next day check before 2 of the meals and before bedtime, etc.).   HYPOGLYCEMIA (low blood sugar) Hypoglycemia is usually a reaction to not eating, exercising, or taking too much insulin/ other diabetes drugs.  Symptoms include tremors, sweating, hunger, confusion, headache, etc. Treat IMMEDIATELY with 15 grams of Carbs: . 4 glucose tablets .  cup regular juice/soda . 2 tablespoons raisins . 4 teaspoons sugar . 1 tablespoon honey Recheck blood glucose in 15 mins and repeat above if still symptomatic/blood glucose <100.  RECOMMENDATIONS TO REDUCE YOUR RISK OF DIABETIC COMPLICATIONS: * Take your prescribed MEDICATION(S) * Follow a DIABETIC diet: Complex carbs,  fiber rich foods, (monounsaturated and polyunsaturated) fats * AVOID saturated/trans fats, high fat foods, >2,300 mg salt per day. * EXERCISE at least 5 times a week for 30 minutes or preferably daily.  * DO NOT SMOKE OR DRINK more than 1 drink a day. * Check your FEET every day. Do not wear tightfitting shoes. Contact us if you develop an ulcer * See your EYE doctor once a year or more if needed * Get a FLU shot once a year * Get a PNEUMONIA vaccine once before and once after age 34 years  GOALS:  * Your Hemoglobin A1c of <7%  * fasting sugars need to be <130 * after meals sugars need to be <180 (2h after you start eating) * Your Systolic BP should be 659 or lower  * Your Diastolic BP should be 80 or lower  * Your HDL (Good Cholesterol) should be 40 or higher  * Your LDL (Bad Cholesterol) should be 100 or lower. * Your Triglycerides should be 150 or lower  * Your Urine microalbumin (kidney function) should be <30 * Your Body Mass Index should be 25 or lower

## 2014-05-27 NOTE — Progress Notes (Signed)
Patient ID: William Nixon, male   DOB: February 19, 1961, 54 y.o.   MRN: [id number]  HPI: William Nixon is a 54 y.o.-year-old male, referred by his PCP, Dr. Sherren Nixon, for management of DM2, dx in ~1998, non-insulin-dependent, controlled, without complications.  Last hemoglobin A1c was: Lab Results  Component Value Date   HGBA1C 7.2* 04/30/2014   HGBA1C 7.3* 01/15/2014   HGBA1C 7.1* 10/02/2013  During summer months, as he is more active, his HbA1c is lower.   Pt is on a regimen of: - Metformin 1000 mg po bid - Glipizide XL 10 mg bid He also tried herbal remedies: - mustard seed daily >> helps a lot - bitter melon once a week.  Pt checks his sugars once a day and they are: - am: 120-135 - 2h after b'fast: n/c - before lunch: n/c - 2h after lunch: n/c - before dinner: n/c - 2h after dinner: n/c - bedtime: n/c - nighttime: n/c No lows. Lowest sugar was 80s; he has hypoglycemia awareness at 80s.  Highest sugar was 148 (fasting).  Glucometer: AccuChek (?[employer]iva)  Pt's meals are vegetarian (mostly): - Breakfast: eggs and bread - Lunch: [place] bread and vegetables - Dinner: bread and vegetables and rice - Snacks: no  He exercises 6x a week >> runs 4-5 miles   - no CKD, last BUN/creatinine:  Lab Results  Component Value Date   BUN 9 04/30/2014   CREATININE 0.74 04/30/2014  On Losartan. - last set of lipids: Lab Results  Component Value Date   CHOL 179 10/02/2013   HDL 28.10* 10/02/2013   LDLCALC 98 10/02/2013   LDLDIRECT 96.9 11/07/2012   TRIG 264.0* 10/02/2013   CHOLHDL 6 10/02/2013  Not on a statin. - last eye exam was in 02/2014 (Dr William Nixon). No DR.  - no numbness and tingling in his feet.  Pt has FH of DM in sister, brother, mother, uncle.  ROS: Constitutional: no weight gain/loss, no fatigue, no subjective hyperthermia/hypothermia Eyes: no blurry vision, no xerophthalmia ENT: no sore throat, no nodules palpated in throat, no dysphagia/odynophagia, no  hoarseness Cardiovascular: no CP/SOB/palpitations/leg swelling Respiratory: no cough/SOB Gastrointestinal: no N/V/D/C Musculoskeletal: no muscle/joint aches Skin: no rashes Neurological: no tremors/numbness/tingling/dizziness Psychiatric: no depression/anxiety  Past Medical History  Diagnosis Date  . Diabetes mellitus     type II  . Hyperlipidemia   . Hyperthyroidism   . Right hydrocele   . S/P ACL repair     right   . Hypertension   . Personal history of colonic polyps - adenomas 08/06/2013    07/2013 - 3 diminutive adenomas - repeat colonoscopy 2018   Past Surgical History  Procedure Laterality Date  . Knee surgery  2002    right   History   Social History  . Marital Status: Married    Spouse Name: N/A    Number of Children: 1   Occupational History  . controller   Social History Main Topics  . Smoking status: Never Smoker   . Smokeless tobacco: Never Used  . Alcohol Use:     1 drink(s) per week  . Drug Use: No   Current Outpatient Prescriptions on File Prior to Visit  Medication Sig Dispense Refill  . Bioflavonoid Products (BIOFLEX PO) Take 1 tablet by mouth daily.    Marland Kitchen glipiZIDE (GLIPIZIDE XL) 10 MG 24 hr tablet take 1 tablet by mouth twice a day 200 tablet 3  . glucose blood test strip One Touch Verio, test twice daily, dx 250.00  200 each 12  . losartan (COZAAR) 50 MG tablet Take 1 tablet (50 mg total) by mouth daily. 90 tablet 3  . metFORMIN (GLUCOPHAGE) 1000 MG tablet take 1 tablet by mouth twice a day 200 tablet 3  . Multiple Vitamin (MULTIVITAMIN WITH MINERALS) TABS Take 1 tablet by mouth daily. Centrum    . ONETOUCH DELICA LANCETS 95A MISC 1 each by Does not apply route daily. 100 each 3  . predniSONE (DELTASONE) 20 MG tablet 2 tabs x 3 days, 1 tab x 3 days, 1/2 tab x 3 days, 1/2 tab M,W,F x 2 weeks 40 tablet 1   No current facility-administered medications on file prior to visit.   No Known Allergies Family History  Problem Relation Age of Onset  .  Alcohol abuse Other   . Diabetes Other   . Hypertension Other   . Colon cancer Neg Hx   . Rectal cancer Neg Hx   . Stomach cancer Neg Hx   . Esophageal cancer Neg Hx   . Pancreatic cancer Neg Hx    PE: BP 138/72 mmHg  Pulse 81  Temp(Src) 97.7 F (36.5 C) (Oral)  Resp 12  Ht 5\' 8"  (1.727 m)  Wt 154 lb 12.8 oz (70.217 kg)  BMI 23.54 kg/m2  SpO2 96% Wt Readings from Last 3 Encounters:  05/27/14 154 lb 12.8 oz (70.217 kg)  05/12/14 154 lb (69.854 kg)  01/20/14 156 lb (70.761 kg)   Constitutional: normal weight, in NAD Eyes: PERRLA, EOMI, no exophthalmos ENT: moist mucous membranes, no thyromegaly, no cervical lymphadenopathy Cardiovascular: RRR, No MRG Respiratory: CTA B Gastrointestinal: abdomen soft, NT, ND, BS+ Musculoskeletal: no deformities, strength intact in all 4 Skin: moist, warm, no rashes Neurological: no tremor with outstretched hands, DTR normal in all 4  ASSESSMENT: 1. 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 Patient with long-standing, well controlled diabetes, on oral antidiabetic regimen, with HbA1c 7.1-7.3% during the winter, lower during the summer. He is doing a great job with diet and exercise. We discussed about options for treatment and decided to continue current regimen, which is working well for him. - I suggested to:  Patient Instructions  Please continue Metformin 1000 mg 2x a day. Continue Glipizide XL 10 mg 2x a day before meals.  Please return in 3 months with your sugar log.   - Strongly advised him to start checking sugars at different times of the day - check once a day, rotating checks - given sugar log and advised how to fill it and to bring it at next appt  - discussed target sugar values - given foot care handout and explained the principles  - given instructions for hypoglycemia management "15-15 rule"  - advised  for yearly eye exams >> he is UTD - Return to clinic in 3 mo with sugar log   - time spent with the patient: 45 min, of which >50% was spent in obtaining information about his DM, reviewing previous labs and treatments, counseling him about her condition (please see the discussed topics above), and developing a plan to treat it. He had a number of questions which I addressed.

## 2014-06-26 ENCOUNTER — Telehealth: Payer: Self-pay | Admitting: Internal Medicine

## 2014-06-26 MED ORDER — GLUCOSE BLOOD VI STRP: ORAL_STRIP | Status: DC

## 2014-06-26 NOTE — Telephone Encounter (Signed)
Patient called this morning stating that his insurance is declining his Rx Please send a new Rx for his test strips as he is testing 5x daily   Rx: One touch   Pharmacy: [facility]    Thank you

## 2014-06-26 NOTE — Telephone Encounter (Signed)
In physicians note, it does not advise testing 5 times daily. Sending in test strips for 3 times daily.

## 2014-07-03 ENCOUNTER — Encounter: Payer: Self-pay | Admitting: Internal Medicine

## 2014-07-03 ENCOUNTER — Other Ambulatory Visit: Payer: Self-pay | Admitting: *Deleted

## 2014-07-03 MED ORDER — LINAGLIPTIN 5 MG PO TABS: 5.0000 mg | ORAL_TABLET | Freq: Every day | ORAL | Status: DC

## 2014-07-03 NOTE — Progress Notes (Unsigned)
Received CBG log from patient (07/03/2014) - A.m.: 98-150 - 2 hours after breakfast 110-196, 217 - Before lunch: 98-132, 146  - 2 hours after lunch: 192-240, 280 - Before dinner: 72, 89-130 - 2 hours after dinner: 163, 198-273  Highest sugars are 2 hours after lunch and 2 hours after dinner. Will try to add Tradjenta. If we cannot use this due to insurance coverage (he has [place] healthcare), we will need to stop glipizide extended-release and try glipizide immediate release 10 mg twice a day. We can also use Invokana.

## 2014-07-03 NOTE — Telephone Encounter (Signed)
Called pt and advised him per Dr Arman Filter note on blood sugar log: add Tradjenta 5 mg in the AM. Advised to let us know if it is too expensive. Pt understood.

## 2014-07-04 ENCOUNTER — Telehealth: Payer: Self-pay | Admitting: Internal Medicine

## 2014-07-04 NOTE — Telephone Encounter (Signed)
Let;s try Invokana 100 mg 30 day supply with 2 refills, but he needs the blue card, he needs to come over to pick it up. This is taken in am, before b'fast. Poss. SEs: - dizziness (be careful when stands from sitting position) - decreased BP (usually not < normal) - fungal UTIs (let me know if develops one).   Let me know how this goes.

## 2014-07-04 NOTE — Telephone Encounter (Signed)
Pt calling regarding the cost of the rx written yesterday 30 day supply will cost $ 92.

## 2014-07-04 NOTE — Telephone Encounter (Signed)
Please read message below and advise.  

## 2014-07-07 NOTE — Telephone Encounter (Signed)
I called the pt and he stated he already has the Rx and the blue card and he will call back if needed.

## 2014-07-08 ENCOUNTER — Other Ambulatory Visit: Payer: Self-pay | Admitting: *Deleted

## 2014-07-08 MED ORDER — CANAGLIFLOZIN 100 MG PO TABS: 100.0000 mg | ORAL_TABLET | Freq: Every day | ORAL | Status: DC

## 2014-07-09 ENCOUNTER — Ambulatory Visit (INDEPENDENT_AMBULATORY_CARE_PROVIDER_SITE_OTHER): Payer: 59 | Admitting: Family Medicine

## 2014-07-09 ENCOUNTER — Encounter: Payer: Self-pay | Admitting: Family Medicine

## 2014-07-09 VITALS — BP 130/80 | HR 90 | Temp 98.4°F | Wt 153.0 lb

## 2014-07-09 DIAGNOSIS — B349 Viral infection, unspecified: Secondary | ICD-10-CM

## 2014-07-09 NOTE — Progress Notes (Signed)
Pre visit review using our clinic review tool, if applicable. No additional management support is needed unless otherwise documented below in the visit note. 

## 2014-07-09 NOTE — Progress Notes (Signed)
   Subjective:    Patient ID: William Nixon, male    DOB: [birth year], 54 y.o.   MRN: [id number]  HPI Acute visit. Patient developed onset yesterday around 4 PM of fever, headache, body aches, malaise. He took some Tylenol which helped his fever. He had flu shot last fall. He's had no cough. He's had some diaphoresis. Occasional diffuse abdominal cramps but no localizing pain. Mild nausea without vomiting. No diarrhea. No sore throat. Generally fairly healthy though he does have history of type 2 diabetes  Past Medical History  Diagnosis Date  . Diabetes mellitus     type II  . Hyperlipidemia   . Hyperthyroidism   . Right hydrocele   . S/P ACL repair     right   . Hypertension   . Personal history of colonic polyps - adenomas 08/06/2013    07/2013 - 3 diminutive adenomas - repeat colonoscopy 2018   Past Surgical History  Procedure Laterality Date  . Knee surgery  2002    right    reports that he has never smoked. He has never used smokeless tobacco. He reports that he drinks about 1.0 oz of alcohol per week. He reports that he does not use illicit drugs. family history includes Alcohol abuse in his other; Diabetes in his other; Hypertension in his other. There is no history of Colon cancer, Rectal cancer, Stomach cancer, Esophageal cancer, or Pancreatic cancer. No Known Allergies    Review of Systems  Constitutional: Positive for fever, chills and fatigue.  HENT: Negative for congestion and sore throat.   Respiratory: Negative for cough and shortness of breath.   Musculoskeletal: Positive for myalgias.  Neurological: Positive for headaches.       Objective:   Physical Exam  Constitutional: He appears well-developed and well-nourished.  HENT:  Right Ear: External ear normal.  Left Ear: External ear normal.  Mouth/Throat: Oropharynx is clear and moist.  Neck: Neck supple.  Cardiovascular: Normal rate and regular rhythm.   Pulmonary/Chest: Effort normal and breath  sounds normal. No respiratory distress. He has no wheezes. He has no rales.  Lymphadenopathy:    He has no cervical adenopathy.  Skin: No rash noted.          Assessment & Plan:  Febrile illness. Suspect viral syndrome. Check flu screen. If negative, treat symptomatically.

## 2014-07-09 NOTE — Patient Instructions (Signed)

## 2014-07-31 ENCOUNTER — Other Ambulatory Visit: Payer: Self-pay | Admitting: Family Medicine

## 2014-08-06 ENCOUNTER — Ambulatory Visit: Payer: 59 | Admitting: Family Medicine

## 2014-08-06 ENCOUNTER — Telehealth: Payer: Self-pay | Admitting: Internal Medicine

## 2014-08-06 DIAGNOSIS — Z0289 Encounter for other administrative examinations: Secondary | ICD-10-CM

## 2014-08-06 NOTE — Telephone Encounter (Signed)
Patient need a  refill of Invokana it needs a Prior Auth, he need it today.

## 2014-08-06 NOTE — Telephone Encounter (Signed)
We are working on PA with pt's ins co.

## 2014-08-07 ENCOUNTER — Telehealth: Payer: Self-pay | Admitting: Internal Medicine

## 2014-08-07 NOTE — Telephone Encounter (Signed)
Called pt and advised him that we are working on the [employer] for [employer]. Pt stated he is out of medication. Advised him to call Dr Ignacia Felling office to see if they have any samples he could get while we are waiting on the PA. Pt voiced understanding.

## 2014-08-07 NOTE — Telephone Encounter (Signed)
Pt states he needs invokana PA for him to be able to get this. He is completely out

## 2014-08-07 NOTE — Telephone Encounter (Signed)
Please call his cell phone

## 2014-08-12 ENCOUNTER — Other Ambulatory Visit: Payer: Self-pay | Admitting: *Deleted

## 2014-08-12 MED ORDER — EMPAGLIFLOZIN 25 MG PO TABS: 25.0000 mg | ORAL_TABLET | Freq: Every day | ORAL | Status: DC

## 2014-08-18 ENCOUNTER — Telehealth: Payer: Self-pay | Admitting: Internal Medicine

## 2014-08-18 NOTE — Telephone Encounter (Signed)
Answered pt's question. Done.

## 2014-08-18 NOTE — Telephone Encounter (Signed)
Pt has a question about a new prescription he was given please call pt back @ [phone]

## 2014-08-19 ENCOUNTER — Other Ambulatory Visit: Payer: Self-pay | Admitting: *Deleted

## 2014-08-19 NOTE — Telephone Encounter (Signed)
Ins denied the Invokana. Dr Cruzita Lederer advised pt to try Jardiance 25 mg. Advised pt. Pt voiced understanding.

## 2014-08-20 ENCOUNTER — Telehealth: Payer: Self-pay | Admitting: *Deleted

## 2014-08-20 ENCOUNTER — Telehealth: Payer: Self-pay | Admitting: Internal Medicine

## 2014-08-20 ENCOUNTER — Other Ambulatory Visit: Payer: Self-pay | Admitting: Family Medicine

## 2014-08-20 NOTE — Telephone Encounter (Signed)
Pt called back again. He is concerned that Jardiance can cause weight loss. Also pt states that his co-pay for [facility] is the same as (or was) as the [place]. Pt states it is $90, he does not want to have so many costly co-pays for medications. Please advise about the weight loss.

## 2014-08-20 NOTE — Telephone Encounter (Signed)
Pt is on Tradjenta and Metformin. Does pt need to continue on Tradjenta as well as the Jardiance. Please advise.

## 2014-08-20 NOTE — Telephone Encounter (Signed)
Do we have any discount cards for Jardiance? Jardiance causes less weight loss than Invokana.

## 2014-08-20 NOTE — Telephone Encounter (Signed)
Yes

## 2014-08-20 NOTE — Telephone Encounter (Signed)
Please call pt regarding rx discussion of jardiance

## 2014-08-21 NOTE — Telephone Encounter (Signed)
Called pt and advised him per Dr Arman Filter message. He understood and asked if we would mail the co-pay card to him. Advised him we would.

## 2014-08-21 NOTE — Telephone Encounter (Signed)
Pt is not on Tradjenta due to the high cost.

## 2014-08-28 ENCOUNTER — Encounter: Payer: Self-pay | Admitting: Internal Medicine

## 2014-08-28 ENCOUNTER — Ambulatory Visit (INDEPENDENT_AMBULATORY_CARE_PROVIDER_SITE_OTHER): Payer: 59 | Admitting: Internal Medicine

## 2014-08-28 VITALS — BP 128/70 | HR 73 | Temp 97.8°F | Resp 12 | Wt 147.8 lb

## 2014-08-28 DIAGNOSIS — E119 Type 2 diabetes mellitus without complications: Secondary | ICD-10-CM

## 2014-08-28 MED ORDER — LINAGLIPTIN 5 MG PO TABS: 5.0000 mg | ORAL_TABLET | Freq: Every day | ORAL | Status: DC

## 2014-08-28 NOTE — Patient Instructions (Signed)
Please use the following diabetes medications:  Medicine  Breakfast Lunch Dinner  Metformin M  M  Glipizide XL G  G  Jardiance J    Trajenta  T    Please start Tradjenta 5 mg before lunch.  Please come back for a follow-up appointment in 2 months - bring sugar log.

## 2014-08-28 NOTE — Progress Notes (Signed)
Patient ID: William Nixon, male   DOB: [birth year], 54 y.o.   MRN: [id number]  HPI: William Nixon is a 54 y.o.-year-old male, returning for f/u DM2, dx in ~1998, non-insulin-dependent, controlled, without complications. Last visit 3 mo ago.  Last hemoglobin A1c was: Lab Results  Component Value Date   HGBA1C 7.2* 04/30/2014   HGBA1C 7.3* 01/15/2014   HGBA1C 7.1* 10/02/2013  During summer months, as he is more active, his HbA1c is lower.   Pt is on a regimen of: - Metformin 1000 mg po bid - Glipizide XL 10 mg bid - Invokana 25 mg added 06/2014 He also tried herbal remedies: - mustard seed daily >> helps a lot - bitter melon once a week.  Reviewed messages from the patient: Received CBG log from patient (07/03/2014) - A.m.: 98-150 - 2 hours after breakfast 110-196, 217 - Before lunch: 98-132, 146  - 2 hours after lunch: 192-240, 280 - Before dinner: 72, 89-130 - 2 hours after dinner: 163, 198-273  Highest sugars are 2 hours after lunch and 2 hours after dinner. Will try to add Tradjenta. If we cannot use this due to insurance coverage (he has [place] [employer]), we will need to stop glipizide extended-release and try glipizide immediate release 10 mg twice a day. We can also use Invokana.  Pt checks his sugars once a day and they are: - am: 120-135 >> 90s, 123-125 - 2h after b'fast: n/c  - before lunch: n/c - 2h after lunch: n/c >> 220-230 - before dinner: n/c  - 2h after dinner: n/c >> 190-220 - bedtime: n/c - nighttime: n/c No lows. Lowest sugar was 90; he has hypoglycemia awareness at 80s.  Highest sugar was 148 (fasting) >> 280  Glucometer: AccuChek (?[employer])  Pt's meals are vegetarian (mostly): - Breakfast: eggs and bread - Lunch: [place] bread and vegetables - Dinner: bread and vegetables and rice - Snacks: no  He exercises 6x a week >> runs 4-5 miles.   - no CKD, last BUN/creatinine:  Lab Results  Component Value Date   BUN 9 04/30/2014   CREATININE  0.74 04/30/2014  On Losartan. - last set of lipids: Lab Results  Component Value Date   CHOL 179 10/02/2013   HDL 28.10* 10/02/2013   LDLCALC 98 10/02/2013   LDLDIRECT 96.9 11/07/2012   TRIG 264.0* 10/02/2013   CHOLHDL 6 10/02/2013  Not on a statin. - last eye exam was in 02/2014 (Dr Deloria Lair). No DR.  - no numbness and tingling in his feet.  ROS: Constitutional: no weight gain/loss, no fatigue, no subjective hyperthermia/hypothermia Eyes: no blurry vision, no xerophthalmia ENT: no sore throat, no nodules palpated in throat, no dysphagia/odynophagia, no hoarseness Cardiovascular: no CP/SOB/palpitations/leg swelling Respiratory: no cough/SOB Gastrointestinal: no N/V/D/C Musculoskeletal: no muscle/joint aches Skin: no rashes Neurological: no tremors/numbness/tingling/dizziness  I reviewed pt's medications, allergies, PMH, social hx, family hx, and changes were documented in the history of present illness. Otherwise, unchanged from my initial visit note.  Past Medical History  Diagnosis Date  . Diabetes mellitus     type II  . Hyperlipidemia   . Hyperthyroidism   . Right hydrocele   . S/P ACL repair     right   . Hypertension   . Personal history of colonic polyps - adenomas 08/06/2013    07/2013 - 3 diminutive adenomas - repeat colonoscopy 2018   Past Surgical History  Procedure Laterality Date  . Knee surgery  2002    right  History   Social History  . Marital Status: Married    Spouse Name: N/A    Number of Children: 1   Occupational History  . controller   Social History Main Topics  . Smoking status: Never Smoker   . Smokeless tobacco: Never Used  . Alcohol Use:     1 drink(s) per week  . Drug Use: No   Current Outpatient Prescriptions on File Prior to Visit  Medication Sig Dispense Refill  . Bioflavonoid Products (BIOFLEX PO) Take 1 tablet by mouth daily.    . canagliflozin (INVOKANA) 100 MG TABS tablet Take 1 tablet (100 mg total) by mouth  daily. 30 tablet 2  . empagliflozin (JARDIANCE) 25 MG TABS tablet Take 25 mg by mouth daily. 30 tablet 2  . glipiZIDE (GLIPIZIDE XL) 10 MG 24 hr tablet take 1 tablet by mouth twice a day 200 tablet 3  . glucose blood test strip One Touch Verio, test 3 times daily as instructed. Vary times testing. Dx: E11.9 200 each 11  . linagliptin (TRADJENTA) 5 MG TABS tablet Take 1 tablet (5 mg total) by mouth daily. 30 tablet 2  . losartan (COZAAR) 50 MG tablet Take 1 tablet (50 mg total) by mouth daily. 90 tablet 3  . metFORMIN (GLUCOPHAGE) 1000 MG tablet take 1 tablet by mouth twice a day 200 tablet 3  . Multiple Vitamin (MULTIVITAMIN WITH MINERALS) TABS Take 1 tablet by mouth daily. Centrum    . ONETOUCH DELICA LANCETS 76P MISC 1 each by Does not apply route daily. 100 each 3  . ramipril (ALTACE) 10 MG capsule TAKE 2 CAPSULES BY MOUTH EVERY MORNING 200 capsule 2  . ramipril (ALTACE) 5 MG capsule TAKE ONE CAPSULE EVERY EVENING 90 capsule 2   No current facility-administered medications on file prior to visit.   No Known Allergies Family History  Problem Relation Age of Onset  . Alcohol abuse Other   . Diabetes Other   . Hypertension Other   . Colon cancer Neg Hx   . Rectal cancer Neg Hx   . Stomach cancer Neg Hx   . Esophageal cancer Neg Hx   . Pancreatic cancer Neg Hx    PE: BP 128/70 mmHg  Pulse 73  Temp(Src) 97.8 F (36.6 C) (Oral)  Resp 12  Wt 147 lb 12.8 oz (67.042 kg)  SpO2 99% Body mass index is 22.48 kg/(m^2). Wt Readings from Last 3 Encounters:  08/28/14 147 lb 12.8 oz (67.042 kg)  07/09/14 153 lb (69.4 kg)  05/27/14 154 lb 12.8 oz (70.217 kg)   Constitutional: normal weight, in NAD Eyes: PERRLA, EOMI, no exophthalmos ENT: moist mucous membranes, no thyromegaly, no cervical lymphadenopathy Cardiovascular: RRR, No MRG Respiratory: CTA B Gastrointestinal: abdomen soft, NT, ND, BS+ Musculoskeletal: no deformities, strength intact in all 4 Skin: moist, warm, no  rashes Neurological: no tremor with outstretched hands, DTR normal in all 4  ASSESSMENT: 1. DM2, non-insulin-dependent, controlled, without complications - also FH of mild DM on mother's side - good control on Glipizide  PLAN:  1. Patient with long-standing, well controlled diabetes, on oral antidiabetic regimen, with HbA1c 7.1-7.3% during the winter, lower during the summer. He is doing a great job with diet and exercise. We again discussed about options for treatment and decided to add Tradjenta before lunch as his sugars after lunch and dinner are the highest. If this does not help his postprandials, will need DM1 testing and possibly insulin. He is thin and lost some weight  on Invokana, does not need to lose any more. He is going to [place] in 1 mo, will schedule him to come back in 2 mo. - I suggested to:  Patient Instructions   Please use the following diabetes medications:  Medicine  Breakfast Lunch Dinner  Metformin M  M  Glipizide XL G  G  Jardiance J    Trajenta  T    Please start Tradjenta 5 mg before lunch.  Please come back for a follow-up appointment in 2 months - bring sugar log.   - continue checking sugars at different times of the day - check 1-2x a day, rotating checks - advised for yearly eye exams >> he is UTD - will check HbA1c in 2 days per his preference >> has annual physical exam with PCP then - Return to clinic in 2 mo with sugar log

## 2014-09-10 ENCOUNTER — Other Ambulatory Visit (INDEPENDENT_AMBULATORY_CARE_PROVIDER_SITE_OTHER): Payer: 59

## 2014-09-10 DIAGNOSIS — Z0000 Encounter for general adult medical examination without abnormal findings: Secondary | ICD-10-CM

## 2014-09-10 LAB — BASIC METABOLIC PANEL
BUN: 12 mg/dL (ref 6–23)
CO2: 28 mEq/L (ref 19–32)
Calcium: 9.5 mg/dL (ref 8.4–10.5)
Chloride: 101 mEq/L (ref 96–112)
Creatinine, Ser: 0.88 mg/dL (ref 0.40–1.50)
GFR: 96.26 mL/min (ref >60.00)
GLUCOSE: 113 mg/dL — AB (ref 70–99)
Potassium: 4.8 mEq/L (ref 3.5–5.1)
SODIUM: 136 meq/L (ref 135–145)

## 2014-09-10 LAB — TSH: TSH: 5.13 u[IU]/mL — AB (ref 0.35–4.50)

## 2014-09-10 LAB — POCT URINALYSIS DIPSTICK
BILIRUBIN UA: NEGATIVE
KETONES UA: NEGATIVE
Leukocytes, UA: NEGATIVE
NITRITE UA: NEGATIVE
Protein, UA: NEGATIVE
RBC UA: NEGATIVE
Spec Grav, UA: 1.01
Urobilinogen, UA: 0.2
pH, UA: 7

## 2014-09-10 LAB — CBC WITH DIFFERENTIAL/PLATELET
BASOS PCT: 0.4 % (ref 0.0–3.0)
Basophils Absolute: 0 10*3/uL (ref 0.0–0.1)
EOS ABS: 0.3 10*3/uL (ref 0.0–0.7)
Eosinophils Relative: 5.4 % — ABNORMAL HIGH (ref 0.0–5.0)
HCT: 45 % (ref 39.0–52.0)
HEMOGLOBIN: 15.4 g/dL (ref 13.0–17.0)
Lymphocytes Relative: 31.5 % (ref 12.0–46.0)
Lymphs Abs: 1.6 10*3/uL (ref 0.7–4.0)
MCHC: 34.2 g/dL (ref 30.0–36.0)
MCV: 86.8 fl (ref 78.0–100.0)
MONOS PCT: 6.9 % (ref 3.0–12.0)
Monocytes Absolute: 0.3 10*3/uL (ref 0.1–1.0)
NEUTROS ABS: 2.8 10*3/uL (ref 1.4–7.7)
Neutrophils Relative %: 55.8 % (ref 43.0–77.0)
PLATELETS: 181 10*3/uL (ref 150.0–400.0)
RBC: 5.19 Mil/uL (ref 4.22–5.81)
RDW: 12.6 % (ref 11.5–15.5)
WBC: 5.1 10*3/uL (ref 4.0–10.5)

## 2014-09-10 LAB — MICROALBUMIN / CREATININE URINE RATIO
Creatinine,U: 30.9 mg/dL
Microalb Creat Ratio: 2.3 mg/g (ref 0.0–30.0)
Microalb, Ur: <0.7 mg/dL (ref 0.0–1.9)

## 2014-09-10 LAB — LIPID PANEL
Cholesterol: 142 mg/dL (ref 0–200)
HDL: 24.7 mg/dL — ABNORMAL LOW (ref >39.00)
LDL Cholesterol: 86 mg/dL (ref 0–99)
NonHDL: 117.3
TRIGLYCERIDES: 157 mg/dL — AB (ref 0.0–149.0)
Total CHOL/HDL Ratio: 6
VLDL: 31.4 mg/dL (ref 0.0–40.0)

## 2014-09-10 LAB — HEPATIC FUNCTION PANEL
ALK PHOS: 73 U/L (ref 39–117)
ALT: 19 U/L (ref 0–53)
AST: 22 U/L (ref 0–37)
Albumin: 4.8 g/dL (ref 3.5–5.2)
BILIRUBIN DIRECT: 0.2 mg/dL (ref 0.0–0.3)
Total Bilirubin: 0.8 mg/dL (ref 0.2–1.2)
Total Protein: 7.4 g/dL (ref 6.0–8.3)

## 2014-09-10 LAB — PSA: PSA: 1.02 ng/mL (ref 0.10–4.00)

## 2014-09-10 LAB — HEMOGLOBIN A1C: HEMOGLOBIN A1C: 6 % (ref 4.6–6.5)

## 2014-09-16 ENCOUNTER — Ambulatory Visit (INDEPENDENT_AMBULATORY_CARE_PROVIDER_SITE_OTHER): Payer: 59 | Admitting: Family Medicine

## 2014-09-16 ENCOUNTER — Encounter: Payer: Self-pay | Admitting: Family Medicine

## 2014-09-16 VITALS — BP 132/72 | Temp 98.2°F | Ht 68.0 in | Wt 149.0 lb

## 2014-09-16 DIAGNOSIS — Z23 Encounter for immunization: Secondary | ICD-10-CM

## 2014-09-16 DIAGNOSIS — E119 Type 2 diabetes mellitus without complications: Secondary | ICD-10-CM

## 2014-09-16 DIAGNOSIS — E1159 Type 2 diabetes mellitus with other circulatory complications: Secondary | ICD-10-CM

## 2014-09-16 DIAGNOSIS — E785 Hyperlipidemia, unspecified: Secondary | ICD-10-CM | POA: Diagnosis not present

## 2014-09-16 DIAGNOSIS — Z0000 Encounter for general adult medical examination without abnormal findings: Secondary | ICD-10-CM | POA: Diagnosis not present

## 2014-09-16 DIAGNOSIS — E1169 Type 2 diabetes mellitus with other specified complication: Secondary | ICD-10-CM | POA: Diagnosis not present

## 2014-09-16 DIAGNOSIS — I10 Essential (primary) hypertension: Secondary | ICD-10-CM

## 2014-09-16 MED ORDER — LOSARTAN POTASSIUM 50 MG PO TABS: 50.0000 mg | ORAL_TABLET | Freq: Every day | ORAL | Status: DC

## 2014-09-16 NOTE — Progress Notes (Signed)
   Subjective:    Patient ID: William Nixon, male    DOB: 10-Sep-1960, 54 y.o.   MRN: [id number]  HPI Mr.Everly is a 54 year old married male nonsmoker who is the Customer service manager for a company in [place] who comes in today for general physical exam because of a history of diabetes  He sees endocrinology on a regular basis. They have him on a combination of oral medicines. Blood sugar 113 A1c 6.0%  He also takes Cozaar 50 mg a day for hypertension BP normal 132/72  He gets routine eye care, dental care, colonoscopy last 2015 shows polyps. He was told to come back in 3 years because he has a suspicious type of polyps.  Vaccinations updated by Apolonio Schneiders  Medication list reviewed it's accurate  He exercises on a regular basis he runs 3-4 miles per day.  Family history father died in his late 54s combination of diabetes and other illnesses mother is in her 78s still in good health with diabetes. No family history of prostate cancer. Discussed the pros and cons of repeat PSA testing   Review of Systems  Constitutional: Negative.   HENT: Negative.   Eyes: Negative.   Respiratory: Negative.   Cardiovascular: Negative.   Gastrointestinal: Negative.   Endocrine: Negative.   Genitourinary: Negative.   Musculoskeletal: Negative.   Skin: Negative.   Allergic/Immunologic: Negative.   Neurological: Negative.   Hematological: Negative.   Psychiatric/Behavioral: Negative.        Objective:   Physical Exam  Constitutional: He is oriented to person, place, and time. He appears well-developed and well-nourished.  HENT:  Head: Normocephalic and atraumatic.  Right Ear: External ear normal.  Left Ear: External ear normal.  Nose: Nose normal.  Mouth/Throat: Oropharynx is clear and moist.  Eyes: Conjunctivae and EOM are normal. Pupils are equal, round, and reactive to light.  Neck: Normal range of motion. Neck supple. No JVD present. No tracheal deviation present. No  thyromegaly present.  Cardiovascular: Normal rate, regular rhythm, normal heart sounds and intact distal pulses.  Exam reveals no gallop and no friction rub.   No murmur heard. Pulmonary/Chest: Effort normal and breath sounds normal. No stridor. No respiratory distress. He has no wheezes. He has no rales. He exhibits no tenderness.  Abdominal: Soft. Bowel sounds are normal. He exhibits no distension and no mass. There is no tenderness. There is no rebound and no guarding.  Genitourinary: Rectum normal, prostate normal and penis normal. Guaiac negative stool. No penile tenderness.  Musculoskeletal: Normal range of motion. He exhibits no edema or tenderness.  Lymphadenopathy:    He has no cervical adenopathy.  Neurological: He is alert and oriented to person, place, and time. He has normal reflexes. No cranial nerve deficit. He exhibits normal muscle tone.  Skin: Skin is warm and dry. No rash noted. No erythema. No pallor.  Psychiatric: He has a normal mood and affect. His behavior is normal. Judgment and thought content normal.  Nursing note and vitals reviewed.         Assessment & Plan:  Healthy male  Diabetes type 2 at goal....... continue current therapy  Hypertension ago continue current therapy

## 2014-09-16 NOTE — Progress Notes (Signed)
Pre visit review using our clinic review tool, if applicable. No additional management support is needed unless otherwise documented below in the visit note. 

## 2014-09-16 NOTE — Patient Instructions (Signed)
Continue current medications  Continue diet and exercise program  Return in one year for general physical examination sooner if any problems........ William Nixon our new adult Designer, jewellery from [employer]

## 2014-10-24 ENCOUNTER — Other Ambulatory Visit: Payer: Self-pay | Admitting: *Deleted

## 2014-10-24 MED ORDER — LINAGLIPTIN 5 MG PO TABS: 5.0000 mg | ORAL_TABLET | Freq: Every day | ORAL | Status: DC

## 2014-10-24 MED ORDER — EMPAGLIFLOZIN 25 MG PO TABS: 25.0000 mg | ORAL_TABLET | Freq: Every day | ORAL | Status: DC

## 2014-11-07 ENCOUNTER — Ambulatory Visit (INDEPENDENT_AMBULATORY_CARE_PROVIDER_SITE_OTHER): Payer: 59 | Admitting: Internal Medicine

## 2014-11-07 ENCOUNTER — Encounter: Payer: Self-pay | Admitting: Internal Medicine

## 2014-11-07 VITALS — BP 118/62 | HR 76 | Temp 98.2°F | Resp 12 | Wt 151.0 lb

## 2014-11-07 DIAGNOSIS — E119 Type 2 diabetes mellitus without complications: Secondary | ICD-10-CM

## 2014-11-07 NOTE — Progress Notes (Signed)
Patient ID: William Nixon, male   DOB: [birth year], 54 y.o.   MRN: [id number]  HPI: AMI THORNSBERRY is a 54 y.o.-year-old male, returning for f/u DM2, dx in ~1998, non-insulin-dependent, controlled, without complications. Last visit 3 mo ago.  Last hemoglobin A1c was: Lab Results  Component Value Date   HGBA1C 6.0 09/10/2014   HGBA1C 7.2* 04/30/2014   HGBA1C 7.3* 01/15/2014  During summer months, as he is more active, his HbA1c is lower.   Pt is on a regimen of:  Medicine  Breakfast Lunch Dinner  Metformin M  M  Glipizide XL G  G  Jardiance J    Trajenta  T    In [place], he stopped [place] but then restarted it as sugars increased in the pm.   Please come back for a follow-up appointment in 3 months - bring sugar log. He also tried herbal remedies: - mustard seed daily >> helps a lot - bitter melon once a week.  Pt checks his sugars once a day and they are greatly improved after adding Tradjenta: - am: 120-135 >> 90s, 123-125 >> 75, 93-117, 129 - 2h after b'fast: n/c  - before lunch: n/c >> 103-165 - 2h after lunch: n/c >> 220-230 >> 122-198 (off Jardiance) - before dinner: n/c  - 2h after dinner: n/c >> 190-220 >> 148-192 - bedtime: n/c - nighttime: n/c No lows. Lowest sugar was 90; he has hypoglycemia awareness at 80s.  Highest sugar was 148 (fasting) >> 280 >> 220  Glucometer: AccuChek (?Aviva)  Pt's meals are vegetarian (mostly): - Breakfast: eggs and bread - Lunch: [place] bread and vegetables - Dinner: bread and vegetables and rice - Snacks: no  He exercises 6x a week >> runs 4-5 miles.   - no CKD, last BUN/creatinine:  Lab Results  Component Value Date   BUN 12 09/10/2014   CREATININE 0.88 09/10/2014  On Losartan. - last set of lipids: Lab Results  Component Value Date   CHOL 142 09/10/2014   HDL 24.70* 09/10/2014   LDLCALC 86 09/10/2014   LDLDIRECT 96.9 11/07/2012   TRIG 157.0* 09/10/2014   CHOLHDL 6 09/10/2014  Not on a statin. - last  eye exam was in 02/2014 (Dr Deloria Lair). No DR.  - no numbness and tingling in his feet.  ROS: Constitutional: + a little weight gain, no fatigue, no subjective hyperthermia/hypothermia Eyes: no blurry vision, no xerophthalmia ENT: no sore throat, no nodules palpated in throat, no dysphagia/odynophagia, no hoarseness Cardiovascular: no CP/SOB/palpitations/leg swelling Respiratory: no cough/SOB Gastrointestinal: no N/V/D/C Musculoskeletal: no muscle/joint aches Skin: no rashes Neurological: no tremors/numbness/tingling/dizziness  I reviewed pt's medications, allergies, PMH, social hx, family hx, and changes were documented in the history of present illness. Otherwise, unchanged from my initial visit note.  Past Medical History  Diagnosis Date  . Diabetes mellitus     type II  . Hyperlipidemia   . Hyperthyroidism   . Right hydrocele   . S/P ACL repair     right   . Hypertension   . Personal history of colonic polyps - adenomas 08/06/2013    07/2013 - 3 diminutive adenomas - repeat colonoscopy 2018   Past Surgical History  Procedure Laterality Date  . Knee surgery  2002    right   History   Social History  . Marital Status: Married    Spouse Name: N/A    Number of Children: 1   Occupational History  . controller   Social History Main Topics  .  Smoking status: Never Smoker   . Smokeless tobacco: Never Used  . Alcohol Use:     1 drink(s) per week  . Drug Use: No   Current Outpatient Prescriptions on File Prior to Visit  Medication Sig Dispense Refill  . Bioflavonoid Products (BIOFLEX PO) Take 1 tablet by mouth daily.    . empagliflozin (JARDIANCE) 25 MG TABS tablet Take 25 mg by mouth daily. 90 tablet 0  . glipiZIDE (GLIPIZIDE XL) 10 MG 24 hr tablet take 1 tablet by mouth twice a day 200 tablet 3  . glucose blood test strip One Touch Verio, test 3 times daily as instructed. Vary times testing. Dx: E11.9 200 each 11  . linagliptin (TRADJENTA) 5 MG TABS tablet Take 1  tablet (5 mg total) by mouth daily. 90 tablet 0  . losartan (COZAAR) 50 MG tablet Take 1 tablet (50 mg total) by mouth daily. 90 tablet 3  . metFORMIN (GLUCOPHAGE) 1000 MG tablet take 1 tablet by mouth twice a day 200 tablet 3  . Multiple Vitamin (MULTIVITAMIN WITH MINERALS) TABS Take 1 tablet by mouth daily. Centrum    . ONETOUCH DELICA LANCETS 84Z MISC 1 each by Does not apply route daily. 100 each 3   No current facility-administered medications on file prior to visit.   No Known Allergies Family History  Problem Relation Age of Onset  . Alcohol abuse Other   . Diabetes Other   . Hypertension Other   . Colon cancer Neg Hx   . Rectal cancer Neg Hx   . Stomach cancer Neg Hx   . Esophageal cancer Neg Hx   . Pancreatic cancer Neg Hx    PE: BP 118/62 mmHg  Pulse 76  Temp(Src) 98.2 F (36.8 C) (Oral)  Resp 12  Wt 151 lb (68.493 kg)  SpO2 97% Body mass index is 22.96 kg/(m^2). Wt Readings from Last 3 Encounters:  11/07/14 151 lb (68.493 kg)  09/16/14 149 lb (67.586 kg)  08/28/14 147 lb 12.8 oz (67.042 kg)   Constitutional: normal weight, in NAD Eyes: PERRLA, EOMI, no exophthalmos ENT: moist mucous membranes, no thyromegaly, no cervical lymphadenopathy Cardiovascular: RRR, No MRG Respiratory: CTA B Gastrointestinal: abdomen soft, NT, ND, BS+ Musculoskeletal: no deformities, strength intact in all 4 Skin: moist, warm, no rashes Neurological: no tremor with outstretched hands, DTR normal in all 4  ASSESSMENT: 1. DM2, non-insulin-dependent, controlled, without complications - also FH of mild DM on mother's side - good control on Glipizide  PLAN:  1. Patient with long-standing, well controlled diabetes, on oral antidiabetic regimen, with HbA1c 7.1-7.3% during the winter, lower during the summer. Last A1c was 6.0%! >> greatly improved after adding Tradjenta. - He is doing a great job with diet and exercise.  - at last visit, we moved Tradjenta to before lunch. - I suggested  to:  Patient Instructions   Please use the following diabetes medications:  Medicine  Breakfast Lunch Dinner  Metformin M  M  Glipizide XL G  G  Jardiance J    Trajenta  T    Please come back for a follow-up appointment in 3 months - bring sugar log.   - continue checking sugars at different times of the day - check 1-2x a day, rotating checks - advised for yearly eye exams >> he is UTD - reviewed HbA1c >> excellent - Return to clinic in 3 mo with sugar log

## 2014-11-07 NOTE — Patient Instructions (Signed)
Please use the following diabetes medications:  Medicine  Breakfast Lunch Dinner  Metformin M  M  Glipizide XL G  G  Jardiance J    Trajenta  T    Please come back for a follow-up appointment in 3 months - bring sugar log.

## 2014-12-01 ENCOUNTER — Other Ambulatory Visit: Payer: Self-pay | Admitting: Family Medicine

## 2014-12-18 ENCOUNTER — Other Ambulatory Visit: Payer: Self-pay | Admitting: Family Medicine

## 2015-02-06 ENCOUNTER — Encounter: Payer: Self-pay | Admitting: Internal Medicine

## 2015-02-06 ENCOUNTER — Other Ambulatory Visit (INDEPENDENT_AMBULATORY_CARE_PROVIDER_SITE_OTHER): Payer: 59 | Admitting: *Deleted

## 2015-02-06 ENCOUNTER — Ambulatory Visit (INDEPENDENT_AMBULATORY_CARE_PROVIDER_SITE_OTHER): Payer: 59 | Admitting: Internal Medicine

## 2015-02-06 VITALS — BP 120/62 | HR 83 | Temp 97.8°F | Resp 12 | Wt 153.6 lb

## 2015-02-06 DIAGNOSIS — Z23 Encounter for immunization: Secondary | ICD-10-CM | POA: Diagnosis not present

## 2015-02-06 DIAGNOSIS — E119 Type 2 diabetes mellitus without complications: Secondary | ICD-10-CM

## 2015-02-06 LAB — POCT GLYCOSYLATED HEMOGLOBIN (HGB A1C): Hemoglobin A1C: 5.8

## 2015-02-06 MED ORDER — EMPAGLIFLOZIN 25 MG PO TABS: 25.0000 mg | ORAL_TABLET | Freq: Every day | ORAL | Status: DC

## 2015-02-06 MED ORDER — METFORMIN HCL 1000 MG PO TABS: 1000.0000 mg | ORAL_TABLET | Freq: Two times a day (BID) | ORAL | Status: DC

## 2015-02-06 MED ORDER — GLIPIZIDE 5 MG PO TABS: 5.0000 mg | ORAL_TABLET | Freq: Two times a day (BID) | ORAL | Status: DC

## 2015-02-06 MED ORDER — LINAGLIPTIN 5 MG PO TABS: 5.0000 mg | ORAL_TABLET | Freq: Every day | ORAL | Status: DC

## 2015-02-06 NOTE — Progress Notes (Signed)
Patient ID: William Nixon, male   DOB: 1960/04/25, 54 y.o.   MRN: [id number]  HPI: William Nixon is a 53 y.o.-year-old male, returning for f/u DM2, dx in ~1998, non-insulin-dependent, controlled, without complications. Last visit 3 mo ago.  Last hemoglobin A1c was: Lab Results  Component Value Date   HGBA1C 6.0 09/10/2014   HGBA1C 7.2* 04/30/2014   HGBA1C 7.3* 01/15/2014  During summer months, as he is more active, his HbA1c is lower.   Pt is on a regimen of: - does not take the T and J as Rx'ed: Medicine  Breakfast Lunch Dinner Bedtime  Metformin M  M   Glipizide 10 mg XL G  G   Jardiance  J - 1/2  J -1/2  Trajenta  T - 1/2  T - 1/2   He also tried herbal remedies: - mustard seed daily >> helps a lot - bitter melon once a week.  Pt checks his sugars once a day and they are dropping his sugars 3h after meals.  - am: 120-135 >> 90s, 123-125 >> 75, 93-117, 129 >> 85-127, 139 - 2h after b'fast: n/c  - before lunch: n/c >> 103-165 - 2h after lunch: n/c >> 220-230 >> 122-198 (off Jardiance) >> 151-240 - before dinner: n/c  - 2h after dinner: n/c >> 190-220 >> 148-192 >> 70 - bedtime: n/c - nighttime: n/c No lows. Lowest sugar was 90 >> 85; he has hypoglycemia awareness at 80s.  Highest sugar was 148 (fasting) >> 280 >> 220 >> 240  Glucometer: AccuChek (?Aviva)  Pt's meals are vegetarian (mostly): - Breakfast: eggs and bread - Lunch: [place] bread and vegetables - Dinner: bread and vegetables and rice - Snacks: no  He exercises 6x a week >> runs 4-5 miles.   - no CKD, last BUN/creatinine:  Lab Results  Component Value Date   BUN 12 09/10/2014   CREATININE 0.88 09/10/2014  On Losartan. - last set of lipids: Lab Results  Component Value Date   CHOL 142 09/10/2014   HDL 24.70* 09/10/2014   LDLCALC 86 09/10/2014   LDLDIRECT 96.9 11/07/2012   TRIG 157.0* 09/10/2014   CHOLHDL 6 09/10/2014  Not on a statin. - last eye exam was in 02/2014 (Dr Deloria Lair). No  DR.  - no numbness and tingling in his feet.  ROS: Constitutional: no weight gain, no fatigue, no subjective hyperthermia/hypothermia Eyes: no blurry vision, no xerophthalmia ENT: no sore throat, no nodules palpated in throat, no dysphagia/odynophagia, no hoarseness Cardiovascular: no CP/SOB/palpitations/leg swelling Respiratory: no cough/SOB Gastrointestinal: no N/V/D/C Musculoskeletal: no muscle/joint aches Skin: no rashes Neurological: no tremors/numbness/tingling/dizziness  I reviewed pt's medications, allergies, PMH, social hx, family hx, and changes were documented in the history of present illness. Otherwise, unchanged from my initial visit note.  Past Medical History  Diagnosis Date  . Diabetes mellitus     type II  . Hyperlipidemia   . Hyperthyroidism   . Right hydrocele   . S/P ACL repair     right   . Hypertension   . Personal history of colonic polyps - adenomas 08/06/2013    07/2013 - 3 diminutive adenomas - repeat colonoscopy 2018   Past Surgical History  Procedure Laterality Date  . Knee surgery  2002    right   History   Social History  . Marital Status: Married    Spouse Name: N/A    Number of Children: 1   Occupational History  . controller   Social History  Main Topics  . Smoking status: Never Smoker   . Smokeless tobacco: Never Used  . Alcohol Use:     1 drink(s) per week  . Drug Use: No   Current Outpatient Prescriptions on File Prior to Visit  Medication Sig Dispense Refill  . Bioflavonoid Products (BIOFLEX PO) Take 1 tablet by mouth daily.    . empagliflozin (JARDIANCE) 25 MG TABS tablet Take 25 mg by mouth daily. 90 tablet 0  . glipiZIDE (GLUCOTROL XL) 10 MG 24 hr tablet TAKE 1 TABLET BY MOUTH TWICE A DAY 200 tablet 1  . glucose blood test strip One Touch Verio, test 3 times daily as instructed. Vary times testing. Dx: E11.9 200 each 11  . linagliptin (TRADJENTA) 5 MG TABS tablet Take 1 tablet (5 mg total) by mouth daily. 90 tablet 0  .  losartan (COZAAR) 50 MG tablet Take 1 tablet (50 mg total) by mouth daily. 90 tablet 3  . metFORMIN (GLUCOPHAGE) 1000 MG tablet TAKE 1 TABLET BY MOUTH TWICE A DAY 200 tablet 1  . Multiple Vitamin (MULTIVITAMIN WITH MINERALS) TABS Take 1 tablet by mouth daily. Centrum    . ONETOUCH DELICA LANCETS 82N MISC 1 each by Does not apply route daily. 100 each 3   No current facility-administered medications on file prior to visit.   No Known Allergies Family History  Problem Relation Age of Onset  . Alcohol abuse Other   . Diabetes Other   . Hypertension Other   . Colon cancer Neg Hx   . Rectal cancer Neg Hx   . Stomach cancer Neg Hx   . Esophageal cancer Neg Hx   . Pancreatic cancer Neg Hx    PE: BP 120/62 mmHg  Pulse 83  Temp(Src) 97.8 F (36.6 C) (Oral)  Resp 12  Wt 153 lb 9.6 oz (69.673 kg)  SpO2 95% Body mass index is 23.36 kg/(m^2). Wt Readings from Last 3 Encounters:  02/06/15 153 lb 9.6 oz (69.673 kg)  11/07/14 151 lb (68.493 kg)  09/16/14 149 lb (67.586 kg)   Constitutional: normal weight, in NAD Eyes: PERRLA, EOMI, no exophthalmos ENT: moist mucous membranes, no thyromegaly, no cervical lymphadenopathy Cardiovascular: RRR, No MRG Respiratory: CTA B Gastrointestinal: abdomen soft, NT, ND, BS+ Musculoskeletal: no deformities, strength intact in all 4 Skin: moist, warm, no rashes Neurological: no tremor with outstretched hands, DTR normal in all 4  ASSESSMENT: 1. DM2, non-insulin-dependent, controlled, without complications - also FH of mild DM on mother's side - good control on Glipizide  PLAN:  1. Patient with long-standing, well controlled diabetes, on oral antidiabetic regimen, with HbA1c 7.1-7.3% during the winter, lower during the summer. Last A1c was 6.0%! >> greatly improved after adding Tradjenta, however, he has higher sugars right after a meal and they decrease after 3h post a meal. He is not taking his meds as advised >> moved half of Tradjenta and Jardiance  dose at bedtime (!). Advised to move the whole dose of them in am. Will also stop Glipizide XL and start regular Glipizide half-dose to avoid late postprandial hypoglycemia. - He is doing a great job with diet and exercise.  - I suggested to:  Patient Instructions   Please use the following diabetes medications:  Medicine  Breakfast Lunch Dinner  Metformin M  M  Glipizide XL G  G  Jardiance J    Trajenta  T    Please come back for a follow-up appointment in 3 months - bring sugar log.   -  continue checking sugars at different times of the day - check 1-2x a day, rotating checks - advised for yearly eye exams >> he is UTD  - has another exam at the end of this mo - will give him the flu shot today - will check HbA1c >> 5.8% - Return to clinic in 3 mo with sugar log

## 2015-02-06 NOTE — Patient Instructions (Signed)
Please change the medication regimen as follows: Medicine  Breakfast Lunch Dinner  Metformin M  M  Glipizide 5 mg G  G  Jardiance J    Trajenta T     Please return in 3 months with your sugar log.

## 2015-02-17 ENCOUNTER — Other Ambulatory Visit: Payer: Self-pay | Admitting: Internal Medicine

## 2015-02-18 ENCOUNTER — Other Ambulatory Visit: Payer: Self-pay | Admitting: *Deleted

## 2015-02-18 MED ORDER — LINAGLIPTIN 5 MG PO TABS: 5.0000 mg | ORAL_TABLET | Freq: Every day | ORAL | Status: DC

## 2015-02-18 MED ORDER — EMPAGLIFLOZIN 25 MG PO TABS: 25.0000 mg | ORAL_TABLET | Freq: Every day | ORAL | Status: DC

## 2015-03-02 LAB — HM DIABETES EYE EXAM

## 2015-03-25 ENCOUNTER — Ambulatory Visit (INDEPENDENT_AMBULATORY_CARE_PROVIDER_SITE_OTHER): Payer: 59 | Admitting: Family Medicine

## 2015-03-25 VITALS — BP 152/80 | HR 88 | Temp 98.1°F | Resp 16 | Ht 68.0 in | Wt 150.3 lb

## 2015-03-25 DIAGNOSIS — I10 Essential (primary) hypertension: Secondary | ICD-10-CM | POA: Diagnosis not present

## 2015-03-25 DIAGNOSIS — E1159 Type 2 diabetes mellitus with other circulatory complications: Secondary | ICD-10-CM | POA: Diagnosis not present

## 2015-03-25 MED ORDER — LOSARTAN POTASSIUM 100 MG PO TABS: 100.0000 mg | ORAL_TABLET | Freq: Every day | ORAL | Status: DC

## 2015-03-25 NOTE — Patient Instructions (Signed)
Go ahead and increase Losartan to 100 mg once daily.

## 2015-03-25 NOTE — Progress Notes (Signed)
   Subjective:    Patient ID: William Nixon, male    DOB: December 09, 1960, 54 y.o.   MRN: [id number]  HPI Patient seen as a work in for elevated blood pressure. He has history of type 2 diabetes which is been well controlled and hypertension. Currently takes losartan 50 mg once daily. Over the past 10 days or so he's had frequent elevated blood pressures around [id number] systolic. He had some poor sleep quality but denies any chest pain. Rare alcohol use. No recent nonsteroidal use. Compliant with therapy. No headaches. Recent A1c 5.8%. Exercises with running about 3-4 days per week  Past Medical History  Diagnosis Date  . Diabetes mellitus     type II  . Hyperlipidemia   . Hyperthyroidism   . Right hydrocele   . S/P ACL repair     right   . Hypertension   . Personal history of colonic polyps - adenomas 08/06/2013    07/2013 - 3 diminutive adenomas - repeat colonoscopy 2018   Past Surgical History  Procedure Laterality Date  . Knee surgery  2002    right    reports that he has never smoked. He has never used smokeless tobacco. He reports that he drinks about 1.0 oz of alcohol per week. He reports that he does not use illicit drugs. family history includes Alcohol abuse in his other; Diabetes in his other; Hypertension in his other. There is no history of Colon cancer, Rectal cancer, Stomach cancer, Esophageal cancer, or Pancreatic cancer. No Known Allergies    Review of Systems  Constitutional: Negative for fever, chills and fatigue.  Eyes: Negative for visual disturbance.  Respiratory: Negative for cough, chest tightness and shortness of breath.   Cardiovascular: Negative for chest pain, palpitations and leg swelling.  Neurological: Negative for dizziness, syncope, weakness, light-headedness and headaches.       Objective:   Physical Exam  Constitutional: He is oriented to person, place, and time. He appears well-developed and well-nourished.  HENT:  Right Ear: External ear  normal.  Left Ear: External ear normal.  Mouth/Throat: Oropharynx is clear and moist.  Eyes: Pupils are equal, round, and reactive to light.  Neck: Neck supple. No thyromegaly present.  Cardiovascular: Normal rate and regular rhythm.   Pulmonary/Chest: Effort normal and breath sounds normal. No respiratory distress. He has no wheezes. He has no rales.  Musculoskeletal: He exhibits no edema.  Neurological: He is alert and oriented to person, place, and time.          Assessment & Plan:  Hypertension. Poorly controlled by several readings over the past couple weeks. Repeat left arm seated at rest today 152/80. Increase losartan to 100 mg once daily. Follow-up with primary within one month to reassess

## 2015-03-25 NOTE — Progress Notes (Signed)
Pre visit review using our clinic review tool, if applicable. No additional management support is needed unless otherwise documented below in the visit note. 

## 2015-04-17 ENCOUNTER — Encounter: Payer: Self-pay | Admitting: Family Medicine

## 2015-05-15 ENCOUNTER — Encounter: Payer: Self-pay | Admitting: Internal Medicine

## 2015-05-15 ENCOUNTER — Ambulatory Visit (INDEPENDENT_AMBULATORY_CARE_PROVIDER_SITE_OTHER): Payer: 59 | Admitting: Internal Medicine

## 2015-05-15 ENCOUNTER — Other Ambulatory Visit (INDEPENDENT_AMBULATORY_CARE_PROVIDER_SITE_OTHER): Payer: 59 | Admitting: *Deleted

## 2015-05-15 VITALS — BP 140/72 | HR 92 | Temp 98.1°F | Resp 12 | Wt 148.8 lb

## 2015-05-15 DIAGNOSIS — E119 Type 2 diabetes mellitus without complications: Secondary | ICD-10-CM

## 2015-05-15 LAB — POCT GLYCOSYLATED HEMOGLOBIN (HGB A1C): HEMOGLOBIN A1C: 5.9

## 2015-05-15 MED ORDER — GLIPIZIDE ER 5 MG PO TB24: 5.0000 mg | ORAL_TABLET | Freq: Every day | ORAL | Status: DC

## 2015-05-15 MED ORDER — GLIPIZIDE 5 MG PO TABS: 5.0000 mg | ORAL_TABLET | Freq: Every day | ORAL | Status: DC

## 2015-05-15 NOTE — Patient Instructions (Addendum)
Please continue:   Medicine  Breakfast Lunch Dinner  Metformin M  M  Glipizide G - 5    Glipizide XL   G XL - 5  Jardiance J    Trajenta  T    Try only taking 1 Glipizide XL 5 mg tablet before dinner.   Please return in 3-4 months with your sugar log.   A1c today: 5.9%!

## 2015-05-15 NOTE — Progress Notes (Signed)
Patient ID: William Nixon, male   DOB: 08-30-1960, 55 y.o.   MRN: [id number]  HPI: William Nixon is a 55 y.o.-year-old male, returning for f/u DM2, dx in ~1998, non-insulin-dependent, controlled, without complications. Last visit 3 mo ago.  Last hemoglobin A1c was: Lab Results  Component Value Date   HGBA1C 5.8 02/06/2015   HGBA1C 6.0 09/10/2014   HGBA1C 7.2* 04/30/2014  During summer months, as he is more active, his HbA1c is lower.   Pt is on a regimen of:  Medicine  Breakfast Lunch Dinner  Metformin M  M  Glipizide G - 5    Glipizide XL   G XL - 10  Jardiance J    Trajenta  T    He also tried herbal remedies: - mustard seed daily >> helps a lot - bitter melon once a week.  Pt checks his sugars 1-2 a day: - am: 120-135 >> 90s, 123-125 >> 75, 93-117, 129 >> 85-127, 139 >> 84, 87-134 - 2h after b'fast: n/c  - before lunch: n/c >> 103-165 >> n/c - 2h after lunch: n/c >> 220-230 >> 122-198 (off Jardiance) >> 151-240 >> 123-161 (snacks at 11 am and reduced lunch) - before dinner: n/c >> 92-114 - 2h after dinner: n/c >> 190-220 >> 148-192 >> 70 >> 156-182 - bedtime: n/c - nighttime: n/c No lows. Lowest sugar was 90 >> 85 >> 84; he has hypoglycemia awareness at 80s.  Highest sugar was 148 (fasting) >> 280 >> 220 >> 240 >> 182.  Glucometer: AccuChek (?[employer])  Pt's meals are vegetarian (mostly): - Breakfast: eggs and bread - Lunch: [place] bread and vegetables - Dinner: bread and vegetables and rice - Snacks: no  He exercises 6x a week >> runs 4-5 miles.   - no CKD, last BUN/creatinine:  Lab Results  Component Value Date   BUN 12 09/10/2014   CREATININE 0.88 09/10/2014  On Losartan. - last set of lipids: Lab Results  Component Value Date   CHOL 142 09/10/2014   HDL 24.70* 09/10/2014   LDLCALC 86 09/10/2014   LDLDIRECT 96.9 11/07/2012   TRIG 157.0* 09/10/2014   CHOLHDL 6 09/10/2014  Not on a statin. - last eye exam was in 02/2015 (Dr Deloria Lair). No  DR.  - no numbness and tingling in his feet.  ROS: Constitutional: no weight gain, no fatigue, no subjective hyperthermia/hypothermia Eyes: no blurry vision, no xerophthalmia ENT: no sore throat, no nodules palpated in throat, no dysphagia/odynophagia, no hoarseness Cardiovascular: no CP/SOB/palpitations/leg swelling Respiratory: no cough/SOB Gastrointestinal: no N/V/D/C Musculoskeletal: no muscle/joint aches Skin: no rashes Neurological: no tremors/numbness/tingling/dizziness  I reviewed pt's medications, allergies, PMH, social hx, family hx, and changes were documented in the history of present illness. Otherwise, unchanged from my initial visit note.  Past Medical History  Diagnosis Date  . Diabetes mellitus     type II  . Hyperlipidemia   . Hyperthyroidism   . Right hydrocele   . S/P ACL repair     right   . Hypertension   . Personal history of colonic polyps - adenomas 08/06/2013    07/2013 - 3 diminutive adenomas - repeat colonoscopy 2018   Past Surgical History  Procedure Laterality Date  . Knee surgery  2002    right   History   Social History  . Marital Status: Married    Spouse Name: N/A    Number of Children: 1   Occupational History  . controller   Social History Main Topics  .  Smoking status: Never Smoker   . Smokeless tobacco: Never Used  . Alcohol Use:     1 drink(s) per week  . Drug Use: No   Current Outpatient Prescriptions on File Prior to Visit  Medication Sig Dispense Refill  . empagliflozin (JARDIANCE) 25 MG TABS tablet Take 25 mg by mouth daily. 90 tablet 1  . glipiZIDE (GLUCOTROL) 5 MG tablet Take 1 tablet (5 mg total) by mouth 2 (two) times daily before a meal. 180 tablet 1  . glucose blood test strip One Touch Verio, test 3 times daily as instructed. Vary times testing. Dx: E11.9 200 each 11  . linagliptin (TRADJENTA) 5 MG TABS tablet Take 1 tablet (5 mg total) by mouth daily. 90 tablet 1  . losartan (COZAAR) 100 MG tablet Take 1 tablet  (100 mg total) by mouth daily. 90 tablet 3  . metFORMIN (GLUCOPHAGE) 1000 MG tablet Take 1 tablet (1,000 mg total) by mouth 2 (two) times daily. 180 tablet 1  . Multiple Vitamin (MULTIVITAMIN WITH MINERALS) TABS Take 1 tablet by mouth daily. Centrum    . ONETOUCH DELICA LANCETS 99991111 MISC 1 each by Does not apply route daily. 100 each 3   No current facility-administered medications on file prior to visit.   No Known Allergies Family History  Problem Relation Age of Onset  . Alcohol abuse Other   . Diabetes Other   . Hypertension Other   . Colon cancer Neg Hx   . Rectal cancer Neg Hx   . Stomach cancer Neg Hx   . Esophageal cancer Neg Hx   . Pancreatic cancer Neg Hx    PE: BP 140/72 mmHg  Pulse 92  Temp(Src) 98.1 F (36.7 C) (Oral)  Resp 12  Wt 148 lb 12.8 oz (67.495 kg)  SpO2 98% Body mass index is 22.63 kg/(m^2). Wt Readings from Last 3 Encounters:  05/15/15 148 lb 12.8 oz (67.495 kg)  03/25/15 150 lb 4.8 oz (68.176 kg)  02/06/15 153 lb 9.6 oz (69.673 kg)   Constitutional: normal weight, in NAD Eyes: PERRLA, EOMI, no exophthalmos ENT: moist mucous membranes, no thyromegaly, no cervical lymphadenopathy Cardiovascular: RRR, No MRG Respiratory: CTA B Gastrointestinal: abdomen soft, NT, ND, BS+ Musculoskeletal: no deformities, strength intact in all 4 Skin: moist, warm, no rashes Neurological: no tremor with outstretched hands, DTR normal in all 4  ASSESSMENT: 1. DM2, non-insulin-dependent, controlled, without complications - also FH of mild DM on mother's side  PLAN:  1. Patient with long-standing, well controlled diabetes, on oral antidiabetic regimen, with HbA1c 7.1-7.3% during the winter, lower during the summer. Last A1c was 5.8%! (great improvement after adding Tradjenta). He started to eat a snack at 11 am and reduce lunch portions >> sugars much better after lunch! He tried 5 mg Glipizide with dinner >> sugars higher at bedtime >> he started to take 10 mg Glipizide  XL before dinner >> sugars are great! Will advise him to try to decrease this dose to 5 mg of Glipizide XL. - He is doing a great job with diet and exercise. He has a lot of stress at work, though >> may need to quit his job. Sleeping poorly, losing weight. - I suggested to:  Patient Instructions   Please continue:   Medicine  Breakfast Lunch Dinner  Metformin M  M  Glipizide G - 5    Glipizide XL   G XL - 5  Jardiance J    Trajenta  T    Try only  taking 1 Glipizide XL 5 mg tablet before dinner.   Please return in 3-4 months with your sugar log.   A1c today: 5.9%!   - continue checking sugars at different times of the day - check 1-2x a day, rotating checks - advised for yearly eye exams >> he is UTD - had the flu shot this season - will check HbA1c >> 5.9% (stable) - Return to clinic in 3-4 mo with sugar log

## 2015-05-31 ENCOUNTER — Other Ambulatory Visit: Payer: Self-pay | Admitting: Internal Medicine

## 2015-06-01 ENCOUNTER — Other Ambulatory Visit: Payer: Self-pay | Admitting: *Deleted

## 2015-06-01 MED ORDER — GLUCOSE BLOOD VI STRP: ORAL_STRIP | Status: DC

## 2015-07-14 ENCOUNTER — Telehealth: Payer: Self-pay | Admitting: Internal Medicine

## 2015-07-14 NOTE — Telephone Encounter (Signed)
I have reviewed pt's last office note. It looks as if pt is to take Glipizide 5 mg at breakfast and Glipizide XL 5 mg at dinner. Please advise so that I can advise the pharmacy correctly. Thank you!

## 2015-07-14 NOTE — Telephone Encounter (Signed)
[facility] called and wanted to clarify which Glipizide Rx that PT was supposed to be on.  PT called in a refill but it was on a very old Rx and they just want to be advised. CB# [phone]

## 2015-07-14 NOTE — Telephone Encounter (Signed)
Called and advised Robin, pharmacist at [facility]. She voiced understanding.

## 2015-07-14 NOTE — Telephone Encounter (Signed)
That is correct. He is on an unusual regimen, I agree.

## 2015-08-24 ENCOUNTER — Other Ambulatory Visit: Payer: Self-pay | Admitting: Internal Medicine

## 2015-09-10 ENCOUNTER — Other Ambulatory Visit (INDEPENDENT_AMBULATORY_CARE_PROVIDER_SITE_OTHER): Payer: 59 | Admitting: *Deleted

## 2015-09-10 ENCOUNTER — Encounter: Payer: Self-pay | Admitting: Internal Medicine

## 2015-09-10 ENCOUNTER — Ambulatory Visit (INDEPENDENT_AMBULATORY_CARE_PROVIDER_SITE_OTHER): Payer: 59 | Admitting: Internal Medicine

## 2015-09-10 VITALS — BP 110/64 | HR 72 | Temp 97.7°F | Resp 12 | Wt 152.0 lb

## 2015-09-10 DIAGNOSIS — I10 Essential (primary) hypertension: Secondary | ICD-10-CM | POA: Diagnosis not present

## 2015-09-10 DIAGNOSIS — E119 Type 2 diabetes mellitus without complications: Secondary | ICD-10-CM

## 2015-09-10 LAB — POCT GLYCOSYLATED HEMOGLOBIN (HGB A1C): HEMOGLOBIN A1C: 5.9

## 2015-09-10 MED ORDER — LOSARTAN POTASSIUM 50 MG PO TABS: 100.0000 mg | ORAL_TABLET | Freq: Every day | ORAL | Status: DC

## 2015-09-10 MED ORDER — GLIPIZIDE ER 5 MG PO TB24: 5.0000 mg | ORAL_TABLET | Freq: Every day | ORAL | Status: DC

## 2015-09-10 MED ORDER — GLIPIZIDE 5 MG PO TABS: 5.0000 mg | ORAL_TABLET | Freq: Every day | ORAL | Status: DC

## 2015-09-10 NOTE — Patient Instructions (Addendum)
Patient Instructions   Please switch: - move Glipizide XL to before b'fast - move regular Glipizide to before dinner  Medicine  Breakfast Lunch Dinner  Metformin M  M  Glipizide G XL - 5    Glipizide XL   G - 5  Jardiance J    Trajenta  T    Please return in 4 months with your sugar log.

## 2015-09-10 NOTE — Progress Notes (Signed)
Patient ID: William Nixon, male   DOB: 1960/11/09, 55 y.o.   MRN: [id number]  HPI: William Nixon is a 55 y.o.-year-old male, returning for f/u DM2, dx in ~1998, non-insulin-dependent, controlled, without complications. Last visit 4 mo ago.  Last hemoglobin A1c was: Lab Results  Component Value Date   HGBA1C 5.9 05/15/2015   HGBA1C 5.8 02/06/2015   HGBA1C 6.0 09/10/2014  During summer months, as he is more active, his HbA1c is lower.   Pt is on a regimen of: Medicine  Breakfast Lunch Dinner  Metformin M  M  Glipizide G - 5    Glipizide XL   G XL - 5  Jardiance J    Trajenta  T    He also tried herbal remedies: - mustard seed daily >> helps a lot - bitter melon once a week.  Pt checks his sugars 1-2 a day: - am: 120-135 >> 90s, 123-125 >> 75, 93-117, 129 >> 85-127, 139 >> 84, 87-134 >> 83, 95-130, 154 - 2h after b'fast: n/c  - before lunch: n/c >> 103-165 >> n/c - 2h after lunch: n/c >> 220-230 >> 122-198 (off Jardiance) >> 151-240 >> 123-161 >> 143-190 - before dinner: n/c >> 92-114 - 2h after dinner: n/c >> 190-220 >> 148-192 >> 70 >> 156-182 >> 172-185 - bedtime: n/c - nighttime: n/c No lows. Lowest sugar was 90 >> 85 >> 84 >> 83; he has hypoglycemia awareness at 80s.  Highest sugar was 148 (fasting) >> 280 >> 220 >> 240 >> 182 >> 195.  Glucometer: AccuChek (?[employer])  Pt's meals are vegetarian (mostly): - Breakfast: eggs and bread - Lunch: [place] bread and vegetables - Dinner: bread and vegetables and rice - Snacks: no  He exercises 6x a week >> runs 4-5 miles.   - no CKD, last BUN/creatinine:  Lab Results  Component Value Date   BUN 12 09/10/2014   CREATININE 0.88 09/10/2014  On Losartan, increased to 100 mg during a period of stress at work >> now resolved >> would like to go back to 50 mg. - last set of lipids: Lab Results  Component Value Date   CHOL 142 09/10/2014   HDL 24.70* 09/10/2014   LDLCALC 86 09/10/2014   LDLDIRECT 96.9 11/07/2012   TRIG 157.0* 09/10/2014   CHOLHDL 6 09/10/2014  Not on a statin. - last eye exam was in 02/2015 (Dr Deloria Lair). No DR.  - no numbness and tingling in his feet.  ROS: Constitutional: no weight gain, no fatigue, no subjective hyperthermia/hypothermia Eyes: no blurry vision, no xerophthalmia ENT: no sore throat, no nodules palpated in throat, no dysphagia/odynophagia, no hoarseness Cardiovascular: no CP/SOB/palpitations/leg swelling Respiratory: no cough/SOB Gastrointestinal: no N/V/D/C Musculoskeletal: no muscle/joint aches Skin: no rashes Neurological: no tremors/numbness/tingling/dizziness  I reviewed pt's medications, allergies, PMH, social hx, family hx, and changes were documented in the history of present illness. Otherwise, unchanged from my initial visit note.  Past Medical History  Diagnosis Date  . Diabetes mellitus     type II  . Hyperlipidemia   . Hyperthyroidism   . Right hydrocele   . S/P ACL repair     right   . Hypertension   . Personal history of colonic polyps - adenomas 08/06/2013    07/2013 - 3 diminutive adenomas - repeat colonoscopy 2018   Past Surgical History  Procedure Laterality Date  . Knee surgery  2002    right   History   Social History  . Marital Status: Married  Spouse Name: N/A    Number of Children: 1   Occupational History  . controller   Social History Main Topics  . Smoking status: Never Smoker   . Smokeless tobacco: Never Used  . Alcohol Use:     1 drink(s) per week  . Drug Use: No   Current Outpatient Prescriptions on File Prior to Visit  Medication Sig Dispense Refill  . empagliflozin (JARDIANCE) 25 MG TABS tablet Take 25 mg by mouth daily. 90 tablet 1  . glipiZIDE (GLUCOTROL XL) 5 MG 24 hr tablet Take 1 tablet (5 mg total) by mouth daily with supper. 90 tablet 1  . glipiZIDE (GLUCOTROL) 5 MG tablet Take 1 tablet (5 mg total) by mouth daily before breakfast. 90 tablet 1  . glucose blood test strip One Touch Verio, test  3 times daily as instructed. Vary times testing. Dx: E11.9 275 each 3  . linagliptin (TRADJENTA) 5 MG TABS tablet Take 1 tablet (5 mg total) by mouth daily. 90 tablet 1  . losartan (COZAAR) 100 MG tablet Take 1 tablet (100 mg total) by mouth daily. 90 tablet 3  . metFORMIN (GLUCOPHAGE) 1000 MG tablet Take 1 tablet (1,000 mg total) by mouth 2 (two) times daily. 180 tablet 1  . Multiple Vitamin (MULTIVITAMIN WITH MINERALS) TABS Take 1 tablet by mouth daily. Centrum    . ONETOUCH DELICA LANCETS 99991111 MISC 1 each by Does not apply route daily. 100 each 3  . TRADJENTA 5 MG TABS tablet TAKE 1 TABLET (5 MG TOTAL) BY MOUTH DAILY. 90 tablet 0   No current facility-administered medications on file prior to visit.   No Known Allergies Family History  Problem Relation Age of Onset  . Alcohol abuse Other   . Diabetes Other   . Hypertension Other   . Colon cancer Neg Hx   . Rectal cancer Neg Hx   . Stomach cancer Neg Hx   . Esophageal cancer Neg Hx   . Pancreatic cancer Neg Hx    PE: BP 110/64 mmHg  Pulse 72  Temp(Src) 97.7 F (36.5 C) (Oral)  Resp 12  Wt 152 lb (68.947 kg)  SpO2 97% Body mass index is 23.12 kg/(m^2). Wt Readings from Last 3 Encounters:  09/10/15 152 lb (68.947 kg)  05/15/15 148 lb 12.8 oz (67.495 kg)  03/25/15 150 lb 4.8 oz (68.176 kg)   Constitutional: normal weight, in NAD Eyes: PERRLA, EOMI, no exophthalmos ENT: moist mucous membranes, no thyromegaly, no cervical lymphadenopathy Cardiovascular: RRR, No MRG Respiratory: CTA B Gastrointestinal: abdomen soft, NT, ND, BS+ Musculoskeletal: no deformities, strength intact in all 4 Skin: moist, warm, no rashes Neurological: no tremor with outstretched hands, DTR normal in all 4  ASSESSMENT: 1. DM2, non-insulin-dependent, controlled, without complications - also FH of mild DM on mother's side  2. HTN  PLAN:  1. Patient with long-standing, well controlled diabetes, on oral antidiabetic regimen, with HbA1c 7.1-7.3%  during the winter, lower during the summer. Last A1c was 5.9%, which is great! (great improvement after adding Tradjenta).  - at this visit, sugars are higher in the second part of the day >> will switch Glipizide XL 5 mg and Glipizide instant release  5 mg (see table) - He is doing a great job with diet and exercise. He still has a lot of stress at work.  - I suggested to:   Patient Instructions   Please switch: - move Glipizide XL to before b'fast - move regular Glipizide to before dinner  Medicine  Breakfast Lunch Dinner  Metformin M  M  Glipizide G XL - 5    Glipizide XL   G - 5  Jardiance J    Trajenta  T    Please return in 4 months with your sugar log.   - continue checking sugars at different times of the day - check 1-2x a day, rotating checks - advised for yearly eye exams >> he is UTD - had the flu shot this season - needs CMP and Lipids - prefers to have them checked by Dr Sherren Mocha in 12/2015 - will check HbA1c >> 5.9% (stable) - Return to clinic in 4 mo with sugar log   2. HTN - Will decrease the Losartan dose to 50 mg daily as his BP is perfect

## 2015-09-14 ENCOUNTER — Other Ambulatory Visit: Payer: Self-pay | Admitting: Internal Medicine

## 2015-09-28 ENCOUNTER — Other Ambulatory Visit: Payer: Self-pay | Admitting: Internal Medicine

## 2015-10-07 ENCOUNTER — Other Ambulatory Visit: Payer: 59

## 2015-10-13 ENCOUNTER — Encounter: Payer: 59 | Admitting: Family Medicine

## 2015-11-23 ENCOUNTER — Other Ambulatory Visit: Payer: Self-pay | Admitting: Internal Medicine

## 2015-11-29 ENCOUNTER — Other Ambulatory Visit: Payer: Self-pay | Admitting: Internal Medicine

## 2015-12-25 ENCOUNTER — Other Ambulatory Visit: Payer: Self-pay | Admitting: Internal Medicine

## 2016-01-06 ENCOUNTER — Other Ambulatory Visit (INDEPENDENT_AMBULATORY_CARE_PROVIDER_SITE_OTHER): Payer: 59

## 2016-01-06 DIAGNOSIS — Z0000 Encounter for general adult medical examination without abnormal findings: Secondary | ICD-10-CM

## 2016-01-06 LAB — CBC WITH DIFFERENTIAL/PLATELET
BASOS PCT: 0.3 % (ref 0.0–3.0)
Basophils Absolute: 0 10*3/uL (ref 0.0–0.1)
EOS PCT: 2.3 % (ref 0.0–5.0)
Eosinophils Absolute: 0.1 10*3/uL (ref 0.0–0.7)
HEMATOCRIT: 41.6 % (ref 39.0–52.0)
HEMOGLOBIN: 14.5 g/dL (ref 13.0–17.0)
LYMPHS PCT: 30.3 % (ref 12.0–46.0)
Lymphs Abs: 1.5 10*3/uL (ref 0.7–4.0)
MCHC: 34.7 g/dL (ref 30.0–36.0)
MCV: 87.6 fl (ref 78.0–100.0)
Monocytes Absolute: 0.5 10*3/uL (ref 0.1–1.0)
Monocytes Relative: 9.5 % (ref 3.0–12.0)
NEUTROS ABS: 2.9 10*3/uL (ref 1.4–7.7)
Neutrophils Relative %: 57.6 % (ref 43.0–77.0)
PLATELETS: 146 10*3/uL — AB (ref 150.0–400.0)
RBC: 4.75 Mil/uL (ref 4.22–5.81)
RDW: 13 % (ref 11.5–15.5)
WBC: 5.1 10*3/uL (ref 4.0–10.5)

## 2016-01-06 LAB — BASIC METABOLIC PANEL
BUN: 11 mg/dL (ref 6–23)
CHLORIDE: 101 meq/L (ref 96–112)
CO2: 28 mEq/L (ref 19–32)
CREATININE: 0.77 mg/dL (ref 0.40–1.50)
Calcium: 8.8 mg/dL (ref 8.4–10.5)
GFR: 111.74 mL/min (ref >60.00)
Glucose, Bld: 96 mg/dL (ref 70–99)
POTASSIUM: 3.7 meq/L (ref 3.5–5.1)
Sodium: 138 mEq/L (ref 135–145)

## 2016-01-06 LAB — TSH: TSH: 7.72 u[IU]/mL — ABNORMAL HIGH (ref 0.35–4.50)

## 2016-01-06 LAB — POC URINALSYSI DIPSTICK (AUTOMATED)
Bilirubin, UA: NEGATIVE
Ketones, UA: NEGATIVE
Leukocytes, UA: NEGATIVE
NITRITE UA: NEGATIVE
PH UA: 6
PROTEIN UA: NEGATIVE
RBC UA: NEGATIVE
UROBILINOGEN UA: 0.2

## 2016-01-06 LAB — MICROALBUMIN / CREATININE URINE RATIO
Creatinine,U: 32.6 mg/dL
MICROALB/CREAT RATIO: 2.1 mg/g (ref 0.0–30.0)
Microalb, Ur: <0.7 mg/dL (ref 0.0–1.9)

## 2016-01-06 LAB — HEMOGLOBIN A1C: HEMOGLOBIN A1C: 6.1 % (ref 4.6–6.5)

## 2016-01-06 LAB — LIPID PANEL
CHOL/HDL RATIO: 6
Cholesterol: 145 mg/dL (ref 0–200)
HDL: 24.9 mg/dL — AB (ref >39.00)
LDL Cholesterol: 89 mg/dL (ref 0–99)
NONHDL: 119.98
TRIGLYCERIDES: 153 mg/dL — AB (ref 0.0–149.0)
VLDL: 30.6 mg/dL (ref 0.0–40.0)

## 2016-01-06 LAB — HEPATIC FUNCTION PANEL
ALBUMIN: 4.3 g/dL (ref 3.5–5.2)
ALK PHOS: 64 U/L (ref 39–117)
ALT: 21 U/L (ref 0–53)
AST: 22 U/L (ref 0–37)
Bilirubin, Direct: 0.2 mg/dL (ref 0.0–0.3)
TOTAL PROTEIN: 6.8 g/dL (ref 6.0–8.3)
Total Bilirubin: 0.9 mg/dL (ref 0.2–1.2)

## 2016-01-06 LAB — PSA: PSA: 0.85 ng/mL (ref 0.10–4.00)

## 2016-01-11 ENCOUNTER — Ambulatory Visit (INDEPENDENT_AMBULATORY_CARE_PROVIDER_SITE_OTHER): Payer: 59 | Admitting: Family Medicine

## 2016-01-11 ENCOUNTER — Encounter: Payer: Self-pay | Admitting: Family Medicine

## 2016-01-11 VITALS — BP 133/74 | HR 70 | Temp 98.0°F | Wt 149.1 lb

## 2016-01-11 DIAGNOSIS — Z23 Encounter for immunization: Secondary | ICD-10-CM | POA: Diagnosis not present

## 2016-01-11 DIAGNOSIS — I10 Essential (primary) hypertension: Secondary | ICD-10-CM

## 2016-01-11 DIAGNOSIS — Z0000 Encounter for general adult medical examination without abnormal findings: Secondary | ICD-10-CM | POA: Diagnosis not present

## 2016-01-11 MED ORDER — METFORMIN HCL 1000 MG PO TABS: ORAL_TABLET | ORAL | 3 refills | Status: DC

## 2016-01-11 MED ORDER — SILDENAFIL CITRATE 20 MG PO TABS: 20.0000 mg | ORAL_TABLET | Freq: Every evening | ORAL | 11 refills | Status: DC | PRN

## 2016-01-11 MED ORDER — LOSARTAN POTASSIUM 50 MG PO TABS: 100.0000 mg | ORAL_TABLET | Freq: Every day | ORAL | 3 refills | Status: DC

## 2016-01-11 MED ORDER — GLIPIZIDE 5 MG PO TABS: 5.0000 mg | ORAL_TABLET | Freq: Every day | ORAL | 3 refills | Status: DC

## 2016-01-11 MED ORDER — LINAGLIPTIN 5 MG PO TABS: ORAL_TABLET | ORAL | 3 refills | Status: DC

## 2016-01-11 MED ORDER — EMPAGLIFLOZIN 25 MG PO TABS: 25.0000 mg | ORAL_TABLET | Freq: Every day | ORAL | 3 refills | Status: DC

## 2016-01-11 MED ORDER — GLIPIZIDE ER 5 MG PO TB24: 5.0000 mg | ORAL_TABLET | Freq: Every day | ORAL | 3 refills | Status: DC

## 2016-01-11 NOTE — Patient Instructions (Signed)
Continue current diet exercise and medication program  Follow-up in one year sooner if any problems

## 2016-01-11 NOTE — Progress Notes (Signed)
William Nixon is a 55 year old married male nonsmoker who comes in for general physical examination because of a history of diabetes and hypertension  His blood sugars well maintained on [name]'s 25 mg daily, Glucotrol 5 mg twice a day, trandjenta, and metformin 1000 mg twice a day. He is a runner and exercises on a relative basis. His weight is 149 pounds. BP at home 135/74  He takes Cozaar 50 mg dose 2 tabs daily for hypertension BP as above normal  He gets routine eye care, dental care, colonoscopy 2015 and repeat in 2018 because a family 1 polyp.  Vaccinations up-to-date seasonal flu shot given today  View of systems otherwise negative except for some mild ED  Physical examination vital signs stable he is afebrile HEENT were negative neck was supple no adenopathy thyroid normal cardiopulmonary exam normal abdominal exam normal genitalia normal male rectum normal stool guaiac-negative prostate normal extremities normal skin normal peripheral pulses normal no neuropathy  Impression  #1 diabetes type 2 at goal........ continue current therapy follow-up in 6 months  #2 hypertension at goal.......... continue current therapy  Mild ED......Marland Kitchen generic Viagra

## 2016-01-11 NOTE — Progress Notes (Signed)
Pre visit review using our clinic review tool, if applicable. No additional management support is needed unless otherwise documented below in the visit note. 

## 2016-01-12 ENCOUNTER — Telehealth: Payer: Self-pay | Admitting: Internal Medicine

## 2016-01-12 NOTE — Telephone Encounter (Signed)
Pt called in, he had a couple of questions regarding his upcoming appointment.  He requests call back to discuss.

## 2016-01-13 ENCOUNTER — Telehealth: Payer: Self-pay

## 2016-01-13 NOTE — Telephone Encounter (Signed)
Called patient and he has just seen his PCP Dr.Todd 01/11/16, they done lab work, which is in his chart already; Dr.Todd mentioned to him that he may not need to see you so soon as his appointment with you is 02/11/16. Patient was wondering if you needed to see him then or if we could push his appointment back? Please advise. Thank you!

## 2016-01-13 NOTE — Telephone Encounter (Signed)
Rescheduling patient for December.

## 2016-01-13 NOTE — Telephone Encounter (Signed)
Patient scheduled for January, will be out of the country in December.

## 2016-01-13 NOTE — Telephone Encounter (Signed)
OK to push the appt back for now - I will see him at the end of 03/2016, then.

## 2016-01-14 ENCOUNTER — Ambulatory Visit: Payer: 59 | Admitting: Internal Medicine

## 2016-01-21 ENCOUNTER — Other Ambulatory Visit: Payer: Self-pay | Admitting: Internal Medicine

## 2016-02-11 ENCOUNTER — Ambulatory Visit: Payer: 59 | Admitting: Internal Medicine

## 2016-02-24 ENCOUNTER — Other Ambulatory Visit: Payer: Self-pay | Admitting: Internal Medicine

## 2016-03-07 LAB — HM DIABETES EYE EXAM

## 2016-03-14 ENCOUNTER — Encounter: Payer: Self-pay | Admitting: Family Medicine

## 2016-04-21 ENCOUNTER — Other Ambulatory Visit: Payer: Self-pay | Admitting: Internal Medicine

## 2016-04-21 NOTE — Telephone Encounter (Signed)
Okay to refill? Last seen 09/10/15. Please advise. Thank you!

## 2016-04-25 NOTE — Telephone Encounter (Signed)
Pharmacy haven't received medication  glipiZIDE (GLUCOTROL XL) 5 MG 24 hr tablet 90 tablet   Please resubmit    [id number] [place], [place] [address] AT [address] [phone] (Phone) [phone] (Fax)

## 2016-04-26 ENCOUNTER — Other Ambulatory Visit: Payer: Self-pay | Admitting: Family Medicine

## 2016-04-27 ENCOUNTER — Encounter: Payer: Self-pay | Admitting: Internal Medicine

## 2016-04-27 ENCOUNTER — Other Ambulatory Visit: Payer: Self-pay

## 2016-04-27 ENCOUNTER — Ambulatory Visit (INDEPENDENT_AMBULATORY_CARE_PROVIDER_SITE_OTHER): Payer: BLUE CROSS/BLUE SHIELD | Admitting: Internal Medicine

## 2016-04-27 VITALS — BP 124/78 | HR 98 | Ht 68.0 in | Wt 152.0 lb

## 2016-04-27 DIAGNOSIS — I10 Essential (primary) hypertension: Secondary | ICD-10-CM | POA: Diagnosis not present

## 2016-04-27 DIAGNOSIS — E119 Type 2 diabetes mellitus without complications: Secondary | ICD-10-CM | POA: Diagnosis not present

## 2016-04-27 LAB — POCT GLYCOSYLATED HEMOGLOBIN (HGB A1C): Hemoglobin A1C: 6.3

## 2016-04-27 MED ORDER — GLIPIZIDE 5 MG PO TABS
5.0000 mg | ORAL_TABLET | Freq: Every day | ORAL | 0 refills | Status: DC
Start: 2016-04-27 — End: 2016-09-14

## 2016-04-27 MED ORDER — METFORMIN HCL 1000 MG PO TABS: ORAL_TABLET | ORAL | 0 refills | Status: DC

## 2016-04-27 MED ORDER — LINAGLIPTIN 5 MG PO TABS: ORAL_TABLET | ORAL | 0 refills | Status: DC

## 2016-04-27 MED ORDER — EMPAGLIFLOZIN 25 MG PO TABS: 25.0000 mg | ORAL_TABLET | Freq: Every day | ORAL | 0 refills | Status: DC

## 2016-04-27 MED ORDER — GLIPIZIDE ER 5 MG PO TB24: ORAL_TABLET | ORAL | 0 refills | Status: DC

## 2016-04-27 NOTE — Progress Notes (Signed)
Patient ID: William Nixon, male   DOB: [birth year], 56 y.o.   MRN: [id number]  HPI: William Nixon is a 56 y.o.-year-old male, returning for f/u DM2, dx in ~1998, non-insulin-dependent, controlled, without complications. Last visit 4 mo ago.  He just returned from [place]. Sugars were better while there.  Last hemoglobin A1c was: Lab Results  Component Value Date   HGBA1C 6.1 01/06/2016   HGBA1C 5.9 09/10/2015   HGBA1C 5.9 05/15/2015  During summer months, as he is more active, his HbA1c is lower.   Pt is on a regimen of: Medicine  Breakfast Lunch Dinner  Metformin M  M  Glipizide G XL - 5    Glipizide XL   G - 5  Jardiance J    Trajenta  T    He also tried herbal remedies: - mustard seed daily >> helps a lot - bitter melon once a week.  Pt checks his sugars 1-2 a day: - am:  75, 93-117, 129 >> 85-127, 139 >> 84, 87-134 >> 83, 95-130, 154 >> 85, 95-118, 145 - 2h after b'fast: n/c  - before lunch: n/c >> 103-165 >> n/c - 2h after lunch:  220-230 >> 122-198 (off Jardiance) >> 151-240 >> 123-161 >> 143-190 >> 168-175 - before dinner: n/c >> 92-114 - 2h after dinner: n/c >> 190-220 >> 148-192 >> 70 >> 156-182 >> 172-185 >> 160-180 - bedtime: n/c - nighttime: n/c No lows. Lowest sugar was 90 >> 85 >> 84 >> 83 >> 85; he has hypoglycemia awareness at 80s.  Highest sugar was 148 (fasting) >> 280 >> 220 >> 240 >> 182 >> 195 >> 180s.  Glucometer: AccuChek (?[employer])  Pt's meals are vegetarian (mostly): - Breakfast: eggs and bread - Lunch: [place] bread and vegetables - Dinner: bread and vegetables and rice - Snacks: no  He still exercises - runs 4-5 miles.   - no CKD, last BUN/creatinine:  Lab Results  Component Value Date   BUN 11 01/06/2016   CREATININE 0.77 01/06/2016  On Losartan 100 mg. - last set of lipids: Lab Results  Component Value Date   CHOL 145 01/06/2016   HDL 24.90 (L) 01/06/2016   LDLCALC 89 01/06/2016   LDLDIRECT 96.9 11/07/2012   TRIG 153.0 (H)  01/06/2016   CHOLHDL 6 01/06/2016  Not on a statin. - last eye exam was in 02/2016 (Dr Deloria Lair). No DR.  - no numbness and tingling in his feet.  ROS: Constitutional: no weight gain, no fatigue, no subjective hyperthermia/hypothermia Eyes: no blurry vision, no xerophthalmia ENT: no sore throat, no nodules palpated in throat, no dysphagia/odynophagia, no hoarseness Cardiovascular: no CP/SOB/palpitations/leg swelling Respiratory: no cough/SOB Gastrointestinal: no N/V/D/C Musculoskeletal: no muscle/joint aches Skin: no rashes Neurological: no tremors/numbness/tingling/dizziness  I reviewed pt's medications, allergies, PMH, social hx, family hx, and changes were documented in the history of present illness. Otherwise, unchanged from my initial visit note.  Past Medical History:  Diagnosis Date  . Diabetes mellitus    type II  . Hyperlipidemia   . Hypertension   . Hyperthyroidism   . Personal history of colonic polyps - adenomas 08/06/2013   07/2013 - 3 diminutive adenomas - repeat colonoscopy 2018  . Right hydrocele   . S/P ACL repair    right    Past Surgical History:  Procedure Laterality Date  . KNEE SURGERY  2002   right   History   Social History  . Marital Status: Married    Spouse Name: N/A  Number of Children: 1   Occupational History  . controller   Social History Main Topics  . Smoking status: Never Smoker   . Smokeless tobacco: Never Used  . Alcohol Use:     1 drink(s) per week  . Drug Use: No   Current Outpatient Prescriptions on File Prior to Visit  Medication Sig Dispense Refill  . empagliflozin (JARDIANCE) 25 MG TABS tablet Take 25 mg by mouth daily. 90 tablet 3  . glipiZIDE (GLUCOTROL XL) 5 MG 24 hr tablet TAKE 1 TABLET DAILY WITH SUPPER 90 tablet 0  . glipiZIDE (GLUCOTROL) 5 MG tablet Take 1 tablet (5 mg total) by mouth daily with breakfast. 90 tablet 3  . glucose blood test strip One Touch Verio, test 3 times daily as instructed. Vary times  testing. Dx: E11.9 275 each 3  . linagliptin (TRADJENTA) 5 MG TABS tablet TAKE 1 TABLET (5 MG TOTAL) BY MOUTH DAILY. 90 tablet 3  . losartan (COZAAR) 100 MG tablet TAKE 1 TABLET BY MOUTH EVERY DAY 90 tablet 3  . metFORMIN (GLUCOPHAGE) 1000 MG tablet TAKE 1 TABLET (1,000 MG TOTAL) BY MOUTH 2 (TWO) TIMES DAILY. 180 tablet 3  . Multiple Vitamin (MULTIVITAMIN WITH MINERALS) TABS Take 1 tablet by mouth daily. Centrum    . ONETOUCH DELICA LANCETS 99991111 MISC 1 each by Does not apply route daily. 100 each 3  . ONETOUCH VERIO test strip USE TO TEST BLOOD SUGAR 3 TIMES DAILY AS DIRECTED VARY TESTING TIMES *DX E11.9* 200 each 5  . ONETOUCH VERIO test strip USE TO TEST BLOOD SUGAR 3 TIMES DAILY AS DIRECTED VARY TESTING TIMES *DX E11.9* 200 each 5  . sildenafil (REVATIO) 20 MG tablet Take 1 tablet (20 mg total) by mouth at bedtime as needed. 20 tablet 11   No current facility-administered medications on file prior to visit.    No Known Allergies Family History  Problem Relation Age of Onset  . Alcohol abuse Other   . Diabetes Other   . Hypertension Other   . Colon cancer Neg Hx   . Rectal cancer Neg Hx   . Stomach cancer Neg Hx   . Esophageal cancer Neg Hx   . Pancreatic cancer Neg Hx    PE: BP 124/78 (BP Location: Left Arm, Patient Position: Sitting)   Pulse 98   Ht 5\' 8"  (1.727 m)   Wt 152 lb (68.9 kg)   SpO2 98%   BMI 23.11 kg/m  Body mass index is 23.11 kg/m. Wt Readings from Last 3 Encounters:  04/27/16 152 lb (68.9 kg)  01/11/16 149 lb 1.6 oz (67.6 kg)  09/10/15 152 lb (68.9 kg)   Constitutional: normal weight, in NAD Eyes: PERRLA, EOMI, no exophthalmos ENT: moist mucous membranes, no thyromegaly, no cervical lymphadenopathy Cardiovascular: RRR, No MRG Respiratory: CTA B Gastrointestinal: abdomen soft, NT, ND, BS+ Musculoskeletal: no deformities, strength intact in all 4 Skin: moist, warm, no rashes Neurological: no tremor with outstretched hands, DTR normal in all  4  ASSESSMENT: 1. DM2, non-insulin-dependent, controlled, without complications - also FH of mild DM on mother's side  2. HTN  PLAN:  1. Patient with long-standing, well controlled diabetes, on oral antidiabetic regimen, with slightly higher HbA1c during the winter, lower during the summer. Last A1c was 5.9%, which is great! (great improvement after adding Tradjenta). At this visit >> 6.3% (higher, but still very good). - I suggested to:   Patient Instructions   Please continue: Medicine  Breakfast Lunch Dinner  Metformin  M  M  Glipizide G XL - 5    Glipizide XL   G - 5  Jardiance J    Trajenta  T    Please return in 4 months with your sugar log.   - continue checking sugars at different times of the day - check 1-2x a day, rotating checks - advised for yearly eye exams >> he is UTD - had the flu shot this season - Return to clinic in 4 mo with sugar log   2. HTN - on Losartan 100 mg daily - BP is great today  Philemon Kingdom, MD PhD [facility] Endocrinology

## 2016-04-27 NOTE — Addendum Note (Signed)
Addended by: Caprice Beaver T on: 04/27/2016 09:17 AM   Modules accepted: Orders

## 2016-04-27 NOTE — Patient Instructions (Signed)
Patient Instructions   Please continue: Medicine  Breakfast Lunch Dinner  Metformin M  M  Glipizide G XL - 5    Glipizide XL   G - 5  Jardiance J    Trajenta  T    Please return in 4 months with your sugar log.

## 2016-05-02 ENCOUNTER — Other Ambulatory Visit: Payer: Self-pay

## 2016-05-02 MED ORDER — GLIPIZIDE ER 5 MG PO TB24: ORAL_TABLET | ORAL | 0 refills | Status: DC

## 2016-05-09 ENCOUNTER — Telehealth: Payer: Self-pay | Admitting: Internal Medicine

## 2016-05-09 ENCOUNTER — Other Ambulatory Visit: Payer: Self-pay

## 2016-05-09 MED ORDER — DAPAGLIFLOZIN PROPANEDIOL 10 MG PO TABS: 10.0000 mg | ORAL_TABLET | Freq: Every day | ORAL | 2 refills | Status: DC

## 2016-05-09 NOTE — Telephone Encounter (Signed)
Will monitor for fax of questions, if I can't find it I will call them back to answer questions.

## 2016-05-09 NOTE — Telephone Encounter (Signed)
Patient stated his insurance has other alternative for the [employer] he didn't know the name of it.  Please advise

## 2016-05-09 NOTE — Telephone Encounter (Signed)
Called patient and left message notifying of medication change. Advised to call back if any questions.

## 2016-05-09 NOTE — Telephone Encounter (Signed)
Can try Farxiga 10.

## 2016-05-09 NOTE — Telephone Encounter (Signed)
[employer] called about the PA for Jardiance and said that there are some clinical questions that were sent back over that are needing to be answered before they can proceed with the PA.  He said if you have any questions to call [phone] REF: [id number]

## 2016-05-09 NOTE — Telephone Encounter (Signed)
RX sent in. Patient LVM notified of changes.

## 2016-07-05 ENCOUNTER — Other Ambulatory Visit: Payer: Self-pay

## 2016-07-05 MED ORDER — DAPAGLIFLOZIN PROPANEDIOL 10 MG PO TABS
10.0000 mg | ORAL_TABLET | Freq: Every day | ORAL | 1 refills | Status: DC
Start: 2016-07-05 — End: 2017-01-06

## 2016-08-28 ENCOUNTER — Other Ambulatory Visit: Payer: Self-pay | Admitting: Internal Medicine

## 2016-08-31 ENCOUNTER — Encounter: Payer: Self-pay | Admitting: Internal Medicine

## 2016-08-31 ENCOUNTER — Ambulatory Visit (INDEPENDENT_AMBULATORY_CARE_PROVIDER_SITE_OTHER): Payer: BLUE CROSS/BLUE SHIELD | Admitting: Internal Medicine

## 2016-08-31 VITALS — BP 128/80 | HR 70 | Ht 68.0 in | Wt 151.0 lb

## 2016-08-31 DIAGNOSIS — I10 Essential (primary) hypertension: Secondary | ICD-10-CM | POA: Diagnosis not present

## 2016-08-31 DIAGNOSIS — E1159 Type 2 diabetes mellitus with other circulatory complications: Secondary | ICD-10-CM | POA: Diagnosis not present

## 2016-08-31 DIAGNOSIS — E119 Type 2 diabetes mellitus without complications: Secondary | ICD-10-CM | POA: Diagnosis not present

## 2016-08-31 LAB — POCT GLYCOSYLATED HEMOGLOBIN (HGB A1C): HEMOGLOBIN A1C: 6.1

## 2016-08-31 NOTE — Patient Instructions (Addendum)
Please continue: Medicine  Breakfast Lunch Dinner  Metformin M  M  Glipizide   G - 5  Glipizide XL G XL - 5    Jardiance/Farxiga J/F    Trajenta  T    Please return in 4 months with your sugar log.

## 2016-08-31 NOTE — Progress Notes (Signed)
Patient ID: William Nixon, male   DOB: 01-25-1961, 56 y.o.   MRN: [id number]  HPI: William Nixon is a 56 y.o.-year-old male, returning for f/u DM2, dx in ~1998, non-insulin-dependent, controlled, without complications. Last visit 4 mo ago.  Since last visit, we had to change from [place] to [place].  He started a new job in 06/2016.  Last hemoglobin A1c was: Lab Results  Component Value Date   HGBA1C 6.3 04/27/2016   HGBA1C 6.1 01/06/2016   HGBA1C 5.9 09/10/2015  During summer months, as he is more active, his HbA1c is lower.   Pt is on a regimen of: Medicine  Breakfast Lunch Dinner  Metformin M  M  Glipizide   G - 5  Glipizide XL G XL - 5    Jardiance/Farxiga J/F    Trajenta  T    He also tried herbal remedies: - mustard seed daily >> helps a lot - bitter melon once a week.  Pt checks his sugars 1-2x a day - am:  83, 95-130, 154 >> 85, 95-118, 145 >> 93, 97-131 - 2h after b'fast: n/c  - before lunch: n/c >> 103-165 >> n/c - 2h after lunch: 123-161 >> 143-190 >> 168-175 >> 153-179, 187, 205 x1 - before dinner: n/c >> 92-114 - 2h after dinner: 156-182 >> 172-185 >> 160-180 >> 176-188 - bedtime: n/c - nighttime: n/c No lows. Lowest sugar was 85 >> 93; he has hypoglycemia awareness in the 80s. Highest sugar was 180s >> 205  Glucometer: AccuChek (?Aviva)  Pt's meals are vegetarian (mostly): - Breakfast: eggs and bread - Lunch: [place] bread and vegetables - Dinner: bread and vegetables and rice - Snacks: no  He exercises >> runs 4-5 miles all days of the week. + lifting weights 2 days a week.  - no CKD, last BUN/creatinine:  Lab Results  Component Value Date   BUN 11 01/06/2016   CREATININE 0.77 01/06/2016  On Losartan 50 mg daily. - last set of lipids: Lab Results  Component Value Date   CHOL 145 01/06/2016   HDL 24.90 (L) 01/06/2016   LDLCALC 89 01/06/2016   LDLDIRECT 96.9 11/07/2012   TRIG 153.0 (H) 01/06/2016   CHOLHDL 6 01/06/2016  Not on a  statin. - last eye exam was in 02/2016 (Dr. Deloria Lair). No DR.  - he denies numbness and tingling in his feet.  ROS: Constitutional: no weight gain/no weight loss, no fatigue, no subjective hyperthermia, no subjective hypothermia Eyes: no blurry vision, no xerophthalmia ENT: no sore throat, no nodules palpated in throat, no dysphagia, no odynophagia, no hoarseness Cardiovascular: no CP/no SOB/no palpitations/no leg swelling Respiratory: no cough/no SOB/no wheezing Gastrointestinal: no N/no V/no D/no C/no acid reflux Musculoskeletal: no muscle aches/no joint aches Skin: no rashes, no hair loss Neurological: no tremors/no numbness/no tingling/no dizziness  I reviewed pt's medications, allergies, PMH, social hx, family hx, and changes were documented in the history of present illness. Otherwise, unchanged from my initial visit note.   Past Medical History:  Diagnosis Date  . Diabetes mellitus    type II  . Hyperlipidemia   . Hypertension   . Hyperthyroidism   . Personal history of colonic polyps - adenomas 08/06/2013   07/2013 - 3 diminutive adenomas - repeat colonoscopy 2018  . Right hydrocele   . S/P ACL repair    right    Past Surgical History:  Procedure Laterality Date  . KNEE SURGERY  2002   right   History  Social History  . Marital Status: Married    Spouse Name: N/A    Number of Children: 1   Occupational History  . controller   Social History Main Topics  . Smoking status: Never Smoker   . Smokeless tobacco: Never Used  . Alcohol Use:     1 drink(s) per week  . Drug Use: No   Current Outpatient Prescriptions on File Prior to Visit  Medication Sig Dispense Refill  . dapagliflozin propanediol (FARXIGA) 10 MG TABS tablet Take 10 mg by mouth daily. 90 tablet 1  . glipiZIDE (GLUCOTROL XL) 5 MG 24 hr tablet TAKE 1 TABLET DAILY WITH SUPPER 90 tablet 0  . glipiZIDE (GLUCOTROL) 5 MG tablet Take 1 tablet (5 mg total) by mouth daily with breakfast. 90 tablet 0   . glucose blood test strip One Touch Verio, test 3 times daily as instructed. Vary times testing. Dx: E11.9 275 each 3  . losartan (COZAAR) 100 MG tablet TAKE 1 TABLET BY MOUTH EVERY DAY 90 tablet 3  . metFORMIN (GLUCOPHAGE) 1000 MG tablet TAKE 1 TABLET (1,000 MG TOTAL) BY MOUTH 2 (TWO) TIMES DAILY. 180 tablet 0  . Multiple Vitamin (MULTIVITAMIN WITH MINERALS) TABS Take 1 tablet by mouth daily. Centrum    . ONETOUCH DELICA LANCETS 62Z MISC 1 each by Does not apply route daily. 100 each 3  . ONETOUCH VERIO test strip USE TO TEST BLOOD SUGAR 3 TIMES DAILY AS DIRECTED VARY TESTING TIMES *DX E11.9* 200 each 5  . ONETOUCH VERIO test strip USE TO TEST BLOOD SUGAR 3 TIMES DAILY AS DIRECTED VARY TESTING TIMES *DX E11.9* 200 each 5  . sildenafil (REVATIO) 20 MG tablet Take 1 tablet (20 mg total) by mouth at bedtime as needed. 20 tablet 11  . TRADJENTA 5 MG TABS tablet TAKE 1 TABLET (5 MG TOTAL) BY MOUTH DAILY. 90 tablet 0   No current facility-administered medications on file prior to visit.    No Known Allergies Family History  Problem Relation Age of Onset  . Alcohol abuse Other   . Diabetes Other   . Hypertension Other   . Colon cancer Neg Hx   . Rectal cancer Neg Hx   . Stomach cancer Neg Hx   . Esophageal cancer Neg Hx   . Pancreatic cancer Neg Hx    PE: BP 128/80 (BP Location: Left Arm, Patient Position: Sitting)   Pulse 70   Ht 5\' 8"  (1.727 m)   Wt 151 lb (68.5 kg)   SpO2 98%   BMI 22.96 kg/m  Body mass index is 22.96 kg/m. Wt Readings from Last 3 Encounters:  08/31/16 151 lb (68.5 kg)  04/27/16 152 lb (68.9 kg)  01/11/16 149 lb 1.6 oz (67.6 kg)   Constitutional: overweight, in NAD Eyes: PERRLA, EOMI, no exophthalmos ENT: moist mucous membranes, no thyromegaly, no cervical lymphadenopathy Cardiovascular: RRR, No MRG Respiratory: CTA B Gastrointestinal: abdomen soft, NT, ND, BS+ Musculoskeletal: no deformities, strength intact in all 4 Skin: moist, warm, no  rashes Neurological: no tremor with outstretched hands, DTR normal in all 4   ASSESSMENT: 1. DM2, non-insulin-dependent, controlled, without complications - also FH of mild DM on mother's side  2. HTN  PLAN:  1. Patient with long-standing, well controlled diabetes, on oral antidiabetic regimen, with good sugars at this visit, despite changing his SGLT2 inh per insurance coverage. He stays active and controls his diet. No changes are necessary for now. - I suggested to:   Patient Instructions  Please continue: Medicine  Breakfast Lunch Dinner  Metformin M  M  Glipizide   G - 5  Glipizide XL G XL - 5    Jardiance/Farxiga J/F    Trajenta  T    Please return in 6 months with your sugar log.   - today, HbA1c is 7%  - continue checking sugars at different times of the day - check 1-2x a day, rotating checks - advised for yearly eye exams >> he is UTD - advised for flu shot >> he is UTD - Return to clinic in 6 mo with sugar log - per his preference   2. HTN - on Losartan 50 mg daily - BP is good today  Philemon Kingdom, MD PhD [facility]

## 2016-08-31 NOTE — Addendum Note (Signed)
Addended by: Caprice Beaver T on: 08/31/2016 08:43 AM   Modules accepted: Orders

## 2016-09-06 ENCOUNTER — Encounter: Payer: Self-pay | Admitting: Internal Medicine

## 2016-09-14 ENCOUNTER — Other Ambulatory Visit: Payer: Self-pay

## 2016-09-14 MED ORDER — GLIPIZIDE 5 MG PO TABS: 5.0000 mg | ORAL_TABLET | Freq: Every day | ORAL | 0 refills | Status: DC

## 2016-09-20 ENCOUNTER — Encounter: Payer: Self-pay | Admitting: Internal Medicine

## 2016-09-21 ENCOUNTER — Encounter: Payer: Self-pay | Admitting: Internal Medicine

## 2016-10-12 ENCOUNTER — Other Ambulatory Visit: Payer: Self-pay | Admitting: Internal Medicine

## 2016-11-20 ENCOUNTER — Other Ambulatory Visit: Payer: Self-pay | Admitting: Internal Medicine

## 2016-11-25 ENCOUNTER — Ambulatory Visit (AMBULATORY_SURGERY_CENTER): Payer: Self-pay | Admitting: *Deleted

## 2016-11-25 VITALS — Ht 68.0 in | Wt 149.8 lb

## 2016-11-25 DIAGNOSIS — Z86010 Personal history of colonic polyps: Secondary | ICD-10-CM

## 2016-11-25 NOTE — Progress Notes (Signed)
No egg or soy allergy known to patient  No issues with past sedation with any surgeries  or procedures, no intubation problems  No diet pills per patient No home 02 use per patient  No blood thinners per patient  Pt denies issues with constipation  No A fib or A flutter  EMMI video sent to pt's e mail  

## 2016-11-28 ENCOUNTER — Encounter: Payer: Self-pay | Admitting: Internal Medicine

## 2016-12-03 ENCOUNTER — Other Ambulatory Visit: Payer: Self-pay | Admitting: Internal Medicine

## 2016-12-08 ENCOUNTER — Other Ambulatory Visit: Payer: Self-pay | Admitting: Internal Medicine

## 2016-12-09 ENCOUNTER — Encounter: Payer: Self-pay | Admitting: Internal Medicine

## 2016-12-09 ENCOUNTER — Ambulatory Visit (AMBULATORY_SURGERY_CENTER): Payer: BLUE CROSS/BLUE SHIELD | Admitting: Internal Medicine

## 2016-12-09 VITALS — BP 110/71 | HR 60 | Temp 98.0°F | Resp 17 | Ht 69.0 in | Wt 149.0 lb

## 2016-12-09 DIAGNOSIS — D123 Benign neoplasm of transverse colon: Secondary | ICD-10-CM

## 2016-12-09 DIAGNOSIS — Z86010 Personal history of colonic polyps: Secondary | ICD-10-CM

## 2016-12-09 MED ORDER — SODIUM CHLORIDE 0.9 % IV SOLN: 500.0000 mL | INTRAVENOUS | Status: DC

## 2016-12-09 NOTE — Progress Notes (Signed)
Pt's states no medical or surgical changes since previsit or office visit. 

## 2016-12-09 NOTE — Op Note (Signed)
[facility] Patient Name: William Nixon Procedure Date: 12/09/2016 9:01 AM MRN: [id number] Endoscopist: Gatha Mayer , MD Age: 56 Referring MD:  Date of Birth: 1960/07/31 Gender: Male Account #: [id number] Procedure:                Colonoscopy Indications:              Surveillance: Personal history of adenomatous                            polyps on last colonoscopy 3 years ago Medicines:                Propofol per Anesthesia, Monitored Anesthesia Care Procedure:                Pre-Anesthesia Assessment:                           - Prior to the procedure, a History and Physical                            was performed, and patient medications and                            allergies were reviewed. The patient's tolerance of                            previous anesthesia was also reviewed. The risks                            and benefits of the procedure and the sedation                            options and risks were discussed with the patient.                            All questions were answered, and informed consent                            was obtained. Prior Anticoagulants: The patient has                            taken no previous anticoagulant or antiplatelet                            agents. ASA Grade Assessment: II - A patient with                            mild systemic disease. After reviewing the risks                            and benefits, the patient was deemed in                            satisfactory condition to undergo the procedure.  After obtaining informed consent, the colonoscope                            was passed under direct vision. Throughout the                            procedure, the patient's blood pressure, pulse, and                            oxygen saturations were monitored continuously. The                            Colonoscope was introduced through the anus and   advanced to the the cecum, identified by                            appendiceal orifice and ileocecal valve. The                            colonoscopy was performed without difficulty. The                            patient tolerated the procedure well. The quality                            of the bowel preparation was good. The ileocecal                            valve, appendiceal orifice, and rectum were                            photographed. The bowel preparation used was                            Miralax. Scope In: 9:10:11 AM Scope Out: 9:20:38 AM Scope Withdrawal Time: 0 hours 8 minutes 18 seconds  Total Procedure Duration: 0 hours 10 minutes 27 seconds  Findings:                 The perianal and digital rectal examinations were                            normal. Pertinent negatives include normal prostate                            (size, shape, and consistency).                           A diminutive polyp was found in the transverse                            colon. The polyp was sessile. The polyp was removed                            with a cold snare. Resection and retrieval were  complete. Verification of patient identification                            for the specimen was done. Estimated blood loss was                            minimal.                           The exam was otherwise without abnormality on                            direct and retroflexion views. Complications:            No immediate complications. Estimated Blood Loss:     Estimated blood loss was minimal. Impression:               - One diminutive polyp in the transverse colon,                            removed with a cold snare. Resected and retrieved.                           - The examination was otherwise normal on direct                            and retroflexion views.                           - Personal history of colonic polyps. 3 adenomas                             removed 2015 Recommendation:           - Patient has a contact number available for                            emergencies. The signs and symptoms of potential                            delayed complications were discussed with the                            patient. Return to normal activities tomorrow.                            Written discharge instructions were provided to the                            patient.                           - Resume previous diet.                           - Continue present medications.                           -  Repeat colonoscopy is recommended for                            surveillance. The colonoscopy date will be                            determined after pathology results from today's                            exam become available for review. Anticipate 2023 Gatha Mayer, MD 12/09/2016 9:25:11 AM This report has been signed electronically.

## 2016-12-09 NOTE — Patient Instructions (Addendum)
   1 tiny polyp removed. I will let you know pathology results and when to have another routine colonoscopy by mail and/or My Chart. Should be in 5 years 2023.  I appreciate the opportunity to care for you. Gatha Mayer, MD, FACG  YOU HAD AN ENDOSCOPIC PROCEDURE TODAY AT [facility]:   Refer to the procedure report that was given to you for any specific questions about what was found during the examination.  If the procedure report does not answer your questions, please call your gastroenterologist to clarify.  If you requested that your care partner not be given the details of your procedure findings, then the procedure report has been included in a sealed envelope for you to review at your convenience later.  YOU SHOULD EXPECT: Some feelings of bloating in the abdomen. Passage of more gas than usual.  Walking can help get rid of the air that was put into your GI tract during the procedure and reduce the bloating. If you had a lower endoscopy (such as a colonoscopy or flexible sigmoidoscopy) you may notice spotting of blood in your stool or on the toilet paper. If you underwent a bowel prep for your procedure, you may not have a normal bowel movement for a few days.  Please Note:  You might notice some irritation and congestion in your nose or some drainage.  This is from the oxygen used during your procedure.  There is no need for concern and it should clear up in a day or so.  SYMPTOMS TO REPORT IMMEDIATELY:   Following lower endoscopy (colonoscopy or flexible sigmoidoscopy):  Excessive amounts of blood in the stool  Significant tenderness or worsening of abdominal pains  Swelling of the abdomen that is new, acute  Fever of 100F or higher    For urgent or emergent issues, a gastroenterologist can be reached at any hour by calling [phone].   DIET:  We do recommend a small meal at first, but then you may proceed to your regular diet.  Drink plenty of  fluids but you should avoid alcoholic beverages for 24 hours.  ACTIVITY:  You should plan to take it easy for the rest of today and you should NOT DRIVE or use heavy machinery until tomorrow (because of the sedation medicines used during the test).    FOLLOW UP: Our staff will call the number listed on your records the next business day following your procedure to check on you and address any questions or concerns that you may have regarding the information given to you following your procedure. If we do not reach you, we will leave a message.  However, if you are feeling well and you are not experiencing any problems, there is no need to return our call.  We will assume that you have returned to your regular daily activities without incident.  If any biopsies were taken you will be contacted by phone or by letter within the next 1-3 weeks.  Please call us at [phone] if you have not heard about the biopsies in 3 weeks.    SIGNATURES/CONFIDENTIALITY: You and/or your care partner have signed paperwork which will be entered into your electronic medical record.  These signatures attest to the fact that that the information above on your After Visit Summary has been reviewed and is understood.  Full responsibility of the confidentiality of this discharge information lies with you and/or your care-partner  Polyp information given.

## 2016-12-09 NOTE — Progress Notes (Signed)
Called to room to assist during endoscopic procedure.  Patient ID and intended procedure confirmed with present staff. Received instructions for my participation in the procedure from the performing physician.  

## 2016-12-09 NOTE — Progress Notes (Signed)
To recovery, report to Scott, RN, VSS 

## 2016-12-12 ENCOUNTER — Telehealth: Payer: Self-pay

## 2016-12-12 ENCOUNTER — Telehealth: Payer: Self-pay | Admitting: *Deleted

## 2016-12-12 NOTE — Telephone Encounter (Signed)
  Follow up Call-  Call back number 12/09/2016  Post procedure Call Back phone  # [phone]  Permission to leave phone message Yes  Some recent data might be hidden     Patient questions:  Do you have a fever, pain , or abdominal swelling? No. Pain Score  0 *  Have you tolerated food without any problems? Yes.    Have you been able to return to your normal activities? Yes.    Do you have any questions about your discharge instructions: Diet   No. Medications  No. Follow up visit  No.  Do you have questions or concerns about your Care? No.  Actions: * If pain score is 4 or above: No action needed, pain <4.

## 2016-12-12 NOTE — Telephone Encounter (Signed)
Called [phone] and left a messaged we tried to reach pt for a follow up call. maw

## 2016-12-20 ENCOUNTER — Encounter: Payer: Self-pay | Admitting: Internal Medicine

## 2016-12-20 NOTE — Progress Notes (Signed)
My Chart letter Single diminutive adenoma recall colon 2023

## 2017-01-04 ENCOUNTER — Other Ambulatory Visit: Payer: BLUE CROSS/BLUE SHIELD

## 2017-01-06 ENCOUNTER — Encounter: Payer: Self-pay | Admitting: Family Medicine

## 2017-01-06 ENCOUNTER — Other Ambulatory Visit: Payer: Self-pay | Admitting: Internal Medicine

## 2017-01-08 ENCOUNTER — Other Ambulatory Visit: Payer: Self-pay | Admitting: Internal Medicine

## 2017-01-10 ENCOUNTER — Ambulatory Visit (INDEPENDENT_AMBULATORY_CARE_PROVIDER_SITE_OTHER): Payer: BLUE CROSS/BLUE SHIELD | Admitting: Family Medicine

## 2017-01-10 ENCOUNTER — Encounter: Payer: Self-pay | Admitting: Family Medicine

## 2017-01-10 VITALS — BP 118/70 | HR 62 | Temp 98.2°F | Ht 69.0 in | Wt 149.0 lb

## 2017-01-10 DIAGNOSIS — E119 Type 2 diabetes mellitus without complications: Secondary | ICD-10-CM

## 2017-01-10 DIAGNOSIS — Z0000 Encounter for general adult medical examination without abnormal findings: Secondary | ICD-10-CM

## 2017-01-10 DIAGNOSIS — Z23 Encounter for immunization: Secondary | ICD-10-CM

## 2017-01-10 DIAGNOSIS — E1169 Type 2 diabetes mellitus with other specified complication: Secondary | ICD-10-CM | POA: Diagnosis not present

## 2017-01-10 DIAGNOSIS — E785 Hyperlipidemia, unspecified: Secondary | ICD-10-CM

## 2017-01-10 DIAGNOSIS — E1159 Type 2 diabetes mellitus with other circulatory complications: Secondary | ICD-10-CM | POA: Diagnosis not present

## 2017-01-10 DIAGNOSIS — I10 Essential (primary) hypertension: Secondary | ICD-10-CM | POA: Diagnosis not present

## 2017-01-10 DIAGNOSIS — I152 Hypertension secondary to endocrine disorders: Secondary | ICD-10-CM

## 2017-01-10 LAB — CBC WITH DIFFERENTIAL/PLATELET
BASOS ABS: 0 10*3/uL (ref 0.0–0.1)
Basophils Relative: 0.3 % (ref 0.0–3.0)
EOS PCT: 2.8 % (ref 0.0–5.0)
Eosinophils Absolute: 0.2 10*3/uL (ref 0.0–0.7)
HCT: 45.9 % (ref 39.0–52.0)
HEMOGLOBIN: 15.6 g/dL (ref 13.0–17.0)
LYMPHS PCT: 21.4 % (ref 12.0–46.0)
Lymphs Abs: 1.2 10*3/uL (ref 0.7–4.0)
MCHC: 34 g/dL (ref 30.0–36.0)
MCV: 90.3 fl (ref 78.0–100.0)
MONOS PCT: 6.2 % (ref 3.0–12.0)
Monocytes Absolute: 0.4 10*3/uL (ref 0.1–1.0)
Neutro Abs: 4.1 10*3/uL (ref 1.4–7.7)
Neutrophils Relative %: 69.3 % (ref 43.0–77.0)
Platelets: 148 10*3/uL — ABNORMAL LOW (ref 150.0–400.0)
RBC: 5.08 Mil/uL (ref 4.22–5.81)
RDW: 13.3 % (ref 11.5–15.5)
WBC: 5.8 10*3/uL (ref 4.0–10.5)

## 2017-01-10 LAB — LIPID PANEL
CHOL/HDL RATIO: 7
Cholesterol: 177 mg/dL (ref 0–200)
HDL: 25.7 mg/dL — ABNORMAL LOW (ref >39.00)
LDL Cholesterol: 114 mg/dL — ABNORMAL HIGH (ref 0–99)
NonHDL: 150.99
TRIGLYCERIDES: 185 mg/dL — AB (ref 0.0–149.0)
VLDL: 37 mg/dL (ref 0.0–40.0)

## 2017-01-10 LAB — POCT URINALYSIS DIPSTICK
BILIRUBIN UA: NEGATIVE
Blood, UA: NEGATIVE
CLARITY UA: NEGATIVE
KETONES UA: NEGATIVE
LEUKOCYTES UA: NEGATIVE
NITRITE UA: NEGATIVE
PH UA: 6 (ref 5.0–8.0)
PROTEIN UA: NEGATIVE
Spec Grav, UA: 1.01 (ref 1.010–1.025)
Urobilinogen, UA: 0.2 E.U./dL

## 2017-01-10 LAB — BASIC METABOLIC PANEL
BUN: 10 mg/dL (ref 6–23)
CHLORIDE: 100 meq/L (ref 96–112)
CO2: 31 meq/L (ref 19–32)
Calcium: 9.5 mg/dL (ref 8.4–10.5)
Creatinine, Ser: 0.78 mg/dL (ref 0.40–1.50)
GFR: 109.68 mL/min (ref >60.00)
GLUCOSE: 121 mg/dL — AB (ref 70–99)
POTASSIUM: 4.1 meq/L (ref 3.5–5.1)
Sodium: 138 mEq/L (ref 135–145)

## 2017-01-10 LAB — MICROALBUMIN / CREATININE URINE RATIO
Creatinine,U: 28.5 mg/dL
Microalb Creat Ratio: 2.5 mg/g (ref 0.0–30.0)
Microalb, Ur: <0.7 mg/dL (ref 0.0–1.9)

## 2017-01-10 LAB — HEPATIC FUNCTION PANEL
ALBUMIN: 4.7 g/dL (ref 3.5–5.2)
ALK PHOS: 65 U/L (ref 39–117)
ALT: 24 U/L (ref 0–53)
AST: 23 U/L (ref 0–37)
BILIRUBIN DIRECT: 0.1 mg/dL (ref 0.0–0.3)
TOTAL PROTEIN: 7.2 g/dL (ref 6.0–8.3)
Total Bilirubin: 0.9 mg/dL (ref 0.2–1.2)

## 2017-01-10 LAB — PSA: PSA: 1.19 ng/mL (ref 0.10–4.00)

## 2017-01-10 LAB — TSH: TSH: 4.41 u[IU]/mL (ref 0.35–4.50)

## 2017-01-10 LAB — HEMOGLOBIN A1C: Hgb A1c MFr Bld: 6.1 % (ref 4.6–6.5)

## 2017-01-10 MED ORDER — SILDENAFIL CITRATE 20 MG PO TABS: 20.0000 mg | ORAL_TABLET | Freq: Every evening | ORAL | 11 refills | Status: DC | PRN

## 2017-01-10 MED ORDER — LOSARTAN POTASSIUM 100 MG PO TABS: 100.0000 mg | ORAL_TABLET | Freq: Every day | ORAL | 4 refills | Status: DC

## 2017-01-10 NOTE — Progress Notes (Signed)
William Nixon is a 56 year old married male nonsmoker who comes in today for general physical examination because of a history of diabetes hyperlipidemia hypertension  His diabetes is managed by Dr. Lorie Apley. He's on multiple medications and is blood sugars are in the 110 range recent A1c May 2018 was 6.1%. He gets annual eye exams by Dr. Zadie Rhine so far no retinopathy. Also no symptoms of neuropathy. He is a runner and exercises daily. His weight is 149 pounds. BMI is 22 height 5 foot 9.  He also takes Cozaar 100 mg daily because a history of mild hypertension. BP 118/70  He also uses generic Viagra for mild ED. He uses 40 mg when necessary  He has mild hyperlipidemia however his LDL is 89. Therefore he's not been on a statin.  He gets routine eye care, dental care, colonoscopy was August 2018. This was a second colonoscopy. His first was done 3 years prior and they found some polyps. He's now on the 5 year program.  Vaccinations the been given a flu shot and Pneumovax today. Information given on shingles. Tetanus booster 2020  14 point review of systems reviewed and otherwise negative  EKG was done because a history of diabetes and hypertension. EKG was normal and unchanged from previous cardiograms  Social history,,,,,,,,, married lives here in [place] works now for a company called [employer]. 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  BP 118/70 (BP Location: Left Arm, Patient Position: Sitting, Cuff Size: Normal)   Pulse 62   Temp 98.2 F (36.8 C) (Oral)   Ht 5\' 9"  (1.753 m)   Wt 149 lb (67.6 kg)   SpO2 98%   BMI 22.00 kg/m  Examination HEENT were negative neck was supple thyroid is not enlarged no carotid bruits cardiopulmonary exam normal abdominal exam normal genitalia normal circumcised male rectum normal sterile guaiac negative prostate normal extremities normal skin normal peripheral pulses normal  #1 diabetes under excellent control...Marland KitchenMarland KitchenMarland Kitchen  recheck labs.......... continue current medication diet and exercise  #2 hypertension at goal........ continue current therapy  #3 mild ED.....Marland Kitchen continue generic Viagra.

## 2017-01-10 NOTE — Patient Instructions (Signed)
Labs today  Call your insurance company to find out where you can get the shingles vaccine the cheapest  Your A1c report will go to Dr. Lorie Apley also    Continue good health habits  Return in one year sooner if any problems  If you would like an ophthalmologist to do year eye exam that I would recommend Dr. done Bing Plume however check with Dr. Zadie Rhine to be sure that's okay with him first

## 2017-02-25 ENCOUNTER — Other Ambulatory Visit: Payer: Self-pay | Admitting: Internal Medicine

## 2017-03-05 ENCOUNTER — Other Ambulatory Visit: Payer: Self-pay | Admitting: Internal Medicine

## 2017-03-06 ENCOUNTER — Encounter: Payer: Self-pay | Admitting: Internal Medicine

## 2017-03-06 ENCOUNTER — Ambulatory Visit (INDEPENDENT_AMBULATORY_CARE_PROVIDER_SITE_OTHER): Payer: BLUE CROSS/BLUE SHIELD | Admitting: Internal Medicine

## 2017-03-06 VITALS — BP 126/86 | HR 71 | Temp 98.0°F | Resp 16 | Wt 152.0 lb

## 2017-03-06 DIAGNOSIS — E119 Type 2 diabetes mellitus without complications: Secondary | ICD-10-CM

## 2017-03-06 DIAGNOSIS — E785 Hyperlipidemia, unspecified: Secondary | ICD-10-CM

## 2017-03-06 MED ORDER — METFORMIN HCL 1000 MG PO TABS: ORAL_TABLET | ORAL | 3 refills | Status: DC

## 2017-03-06 MED ORDER — GLUCOSE BLOOD VI STRP: ORAL_STRIP | 5 refills | Status: DC

## 2017-03-06 MED ORDER — LINAGLIPTIN 5 MG PO TABS: ORAL_TABLET | ORAL | 3 refills | Status: DC

## 2017-03-06 MED ORDER — GLIPIZIDE 5 MG PO TABS: 5.0000 mg | ORAL_TABLET | Freq: Every day | ORAL | 3 refills | Status: DC

## 2017-03-06 MED ORDER — GLIPIZIDE ER 5 MG PO TB24: ORAL_TABLET | ORAL | 3 refills | Status: DC

## 2017-03-06 MED ORDER — FREESTYLE LIBRE 14 DAY SENSOR MISC: 1.0000 | 11 refills | Status: DC

## 2017-03-06 MED ORDER — FREESTYLE LIBRE 14 DAY READER DEVI: 1.0000 | Freq: Once | 1 refills | Status: AC

## 2017-03-06 MED ORDER — DAPAGLIFLOZIN PROPANEDIOL 10 MG PO TABS: 10.0000 mg | ORAL_TABLET | Freq: Every day | ORAL | 3 refills | Status: DC

## 2017-03-06 NOTE — Progress Notes (Signed)
Patient ID: William Nixon, male   DOB: [birth year], 56 y.o.   MRN: [id number]  HPI: William Nixon is a 56 y.o.-year-old male, returning for f/u DM2, dx in ~1998, non-insulin-dependent, controlled, without complications. Last visit 6 mo ago.  He recently returned from [place], his mother passed away: complications from COPD.  He started a new job in 06/2016.  Last hemoglobin A1c was: Lab Results  Component Value Date   HGBA1C 6.1 01/10/2017   HGBA1C 6.1 08/31/2016   HGBA1C 6.3 04/27/2016  During summer months, as he is more active, his HbA1c is lower.  Pt is on: Medicine  Breakfast Lunch Dinner  Metformin M  M  Glipizide   G - 5  Glipizide XL G XL - 5    Jardiance/Farxiga J/F    Trajenta  T    He also tried herbal remedies: - mustard seed daily >> helps a lot - bitter melon once a week.  Pt checks his sugars 1-2x a day: - am:  83, 95-130, 154 >> 85, 95-118, 145 >> 93, 97-131 >> 90, 110-118, 130 - 2h after b'fast: n/c  - before lunch: n/c >> 103-165 >> n/c - 2h after lunch: 168-175 >> 153-179, 187, 205 x1 >> 157-165 - before dinner: n/c >> 92-114 - 2h after dinner:172-185 >> 160-180 >> 176-188 >> 170-175 - bedtime: n/c - nighttime: n/c Lowest sugar was 85 >> 93 >> 90; he has hypoglycemia awareness in the 80s. Highest sugar was 180s >> 205 >> 175.  Glucometer: AccuChek (?[employer])  Pt's meals are vegetarian (mostly): - Breakfast: 2 eggs (!) + whole grain bread - Lunch: [place] bread, tortilla, and rice, vegetables - Dinner: bread and vegetables and rice - Snacks: no  He exercises >> runs 4-5 mi all days of the week. + lifting weights 2x a week.  - No CKD, last BUN/creatinine:  Lab Results  Component Value Date   BUN 10 01/10/2017   CREATININE 0.78 01/10/2017  On Losartan 50.  Lab Results  Component Value Date   MICRALBCREAT 2.5 01/10/2017   MICRALBCREAT 2.1 01/06/2016   MICRALBCREAT 2.3 09/10/2014   MICRALBCREAT 0.0 04/30/2014   MICRALBCREAT 0.6  10/02/2013   MICRALBCREAT 1.3 05/29/2013   MICRALBCREAT 0.8 11/07/2012   MICRALBCREAT 0.7 11/02/2011   MICRALBCREAT 1.2 04/27/2011   MICRALBCREAT 0.7 10/27/2010   - + HL; last set of lipids: Lab Results  Component Value Date   CHOL 177 01/10/2017   HDL 25.70 (L) 01/10/2017   LDLCALC 114 (H) 01/10/2017   LDLDIRECT 96.9 11/07/2012   TRIG 185.0 (H) 01/10/2017   CHOLHDL 7 01/10/2017  Not on a statin. - last eye exam was in 02/2016 >> No DR (Dr. Deloria Lair). Has an appt schedule 04/24/2016. - no numbness and tingling in his feet.  ROS: Constitutional: no weight gain/loss, no fatigue, no subjective hyperthermia/hypothermia Eyes: no blurry vision, no xerophthalmia ENT: no sore throat, no nodules palpated in throat, no dysphagia/odynophagia, no hoarseness Cardiovascular: no CP/SOB/palpitations/leg swelling Respiratory: no cough/SOB Gastrointestinal: no N/V/D/C Musculoskeletal: no muscle/joint aches Skin: no rashes Neurological: no tremors/numbness/tingling/dizziness Psychiatric: no depression/anxiety  Past Medical History:  Diagnosis Date  . Diabetes mellitus    type II  . Hyperlipidemia    pt. states that hdl ldl was elevated denies hyperlipidemia  . Hypertension   . Hyperthyroidism    pt. denies having any thyroid issues  . Personal history of colonic polyps - adenomas 08/06/2013   07/2013 - 3 diminutive adenomas - repeat colonoscopy 2018  .  Right hydrocele   . S/P ACL repair    right    Past Surgical History:  Procedure Laterality Date  . COLONOSCOPY    . KNEE SURGERY  2002   right  . POLYPECTOMY     History   Social History  . Marital Status: Married    Spouse Name: N/A    Number of Children: 1   Occupational History  . controller   Social History Main Topics  . Smoking status: Never Smoker   . Smokeless tobacco: Never Used  . Alcohol Use:     1 drink(s) per week  . Drug Use: No   Current Outpatient Medications on File Prior to Visit  Medication Sig  Dispense Refill  . FARXIGA 10 MG TABS tablet TAKE 10 MG BY MOUTH DAILY. 90 tablet 1  . glipiZIDE (GLUCOTROL XL) 5 MG 24 hr tablet TAKE 1 TABLET DAILY WITH SUPPER 90 tablet 0  . glipiZIDE (GLUCOTROL) 5 MG tablet TAKE 1 TABLET (5 MG TOTAL) BY MOUTH DAILY WITH BREAKFAST. 90 tablet 0  . glucose blood test strip One Touch Verio, test 3 times daily as instructed. Vary times testing. Dx: E11.9 275 each 3  . losartan (COZAAR) 100 MG tablet Take 1 tablet (100 mg total) by mouth daily. 90 tablet 4  . metFORMIN (GLUCOPHAGE) 1000 MG tablet TAKE 1 TABLET (1,000 MG TOTAL) BY MOUTH 2 (TWO) TIMES DAILY. 180 tablet 0  . Multiple Vitamin (MULTIVITAMIN WITH MINERALS) TABS Take 1 tablet by mouth daily. Centrum    . ONETOUCH DELICA LANCETS 86P MISC 1 each by Does not apply route daily. 100 each 3  . ONETOUCH VERIO test strip USE TO TEST BLOOD SUGAR 3 TIMES DAILY AS DIRECTED VARY TESTING TIMES *DX E11.9* 200 each 5  . sildenafil (REVATIO) 20 MG tablet Take 1 tablet (20 mg total) by mouth at bedtime as needed. 30 tablet 11  . TRADJENTA 5 MG TABS tablet TAKE 1 TABLET (5 MG TOTAL) BY MOUTH DAILY. 90 tablet 0   Current Facility-Administered Medications on File Prior to Visit  Medication Dose Route Frequency Provider Last Rate Last Dose  . 0.9 %  sodium chloride infusion  500 mL Intravenous Continuous Gatha Mayer, MD      . 0.9 %  sodium chloride infusion  500 mL Intravenous Continuous Gatha Mayer, MD       No Known Allergies Family History  Problem Relation Age of Onset  . Alcohol abuse Other   . Diabetes Other   . Hypertension Other   . Colon cancer Neg Hx   . Rectal cancer Neg Hx   . Stomach cancer Neg Hx   . Esophageal cancer Neg Hx   . Pancreatic cancer Neg Hx   . Colon polyps Neg Hx    PE: BP 126/86 (BP Location: Right Arm, Patient Position: Sitting, Cuff Size: Normal)   Pulse 71   Temp 98 F (36.7 C) (Oral)   Resp 16   Wt 152 lb (68.9 kg)   SpO2 98%   BMI 22.45 kg/m  Body mass index is  22.45 kg/m. Wt Readings from Last 3 Encounters:  03/06/17 152 lb (68.9 kg)  01/10/17 149 lb (67.6 kg)  12/09/16 149 lb (67.6 kg)   Constitutional: normalweight, in NAD Eyes: PERRLA, EOMI, no exophthalmos ENT: moist mucous membranes, no thyromegaly, no cervical lymphadenopathy Cardiovascular: RRR, No MRG Respiratory: CTA B Gastrointestinal: abdomen soft, NT, ND, BS+ Musculoskeletal: no deformities, strength intact in all 4 Skin: moist,  warm, no rashes Neurological: no tremor with outstretched hands, DTR normal in all 4   ASSESSMENT: 1. DM2, non-insulin-dependent, controlled, without complications - also FH of mild DM on mother's side  2. HL  PLAN:  1. Patient with long standing, well controlled DM, on oral DM meds. He is ery conscientious >> exercises and eats a mostly vegetarian diet. However, at this visit, we discussed about his meals >> has 2 eggs every am >> discussed that these will increase is insulin resistance >> advised to reduce them or even stop. - otherwise, will not change regimen - reviewed his recent HbA1c: 6.1% (great!) - I suggested to:   Patient Instructions   Please continue: Medicine  Breakfast Lunch Dinner  Metformin M  M  Glipizide   G - 5  Glipizide XL G XL - 5    Jardiance/Farxiga J/F    Trajenta  T    Please return in 6 months with your sugar log.   - continue checking sugars at different times of the day - check 1x a day, rotating checks - advised for yearly eye exams >> he is UTD and will have a new appt soon - Return to clinic in 6 mo with sugar log   2. HL - reviewed together the last 2 lipid panel: last was worse - he is not on statins - discussed the need to ideally decrease total cholesterol to <150 - discussed the need to improve diet >> eliminate eggs   Philemon Kingdom, MD PhD [facility] End[facility]

## 2017-03-06 NOTE — Patient Instructions (Addendum)
Please continue: Medicine  Breakfast Lunch Dinner  Metformin M  M  Glipizide   G - 5  Glipizide XL G XL - 5    Jardiance/Farxiga J/F    Trajenta  T    Please return in 6 months with your sugar log.

## 2017-05-03 DIAGNOSIS — H43822 Vitreomacular adhesion, left eye: Secondary | ICD-10-CM | POA: Diagnosis not present

## 2017-05-03 DIAGNOSIS — H2513 Age-related nuclear cataract, bilateral: Secondary | ICD-10-CM | POA: Diagnosis not present

## 2017-05-03 DIAGNOSIS — E119 Type 2 diabetes mellitus without complications: Secondary | ICD-10-CM | POA: Diagnosis not present

## 2017-05-03 DIAGNOSIS — H43821 Vitreomacular adhesion, right eye: Secondary | ICD-10-CM | POA: Diagnosis not present

## 2017-05-03 LAB — HM DIABETES EYE EXAM

## 2017-05-15 ENCOUNTER — Telehealth: Payer: Self-pay

## 2017-05-15 NOTE — Telephone Encounter (Signed)
LMTCB

## 2017-05-15 NOTE — Telephone Encounter (Signed)
Sorry computer shut down during input of previous message  He is going to call us back after he finds out what the alternate meter is

## 2017-05-15 NOTE — Telephone Encounter (Signed)
Any mtere that is covered works

## 2017-05-15 NOTE — Telephone Encounter (Signed)
I let pt know that we needed to know what will be covered by his insurance. He is going

## 2017-05-15 NOTE — Telephone Encounter (Signed)
Received PA for OneTouch Verio test strips, does the patient have to stay on this or can it be substituted and new meter sent. Please advise

## 2017-05-17 ENCOUNTER — Telehealth: Payer: Self-pay | Admitting: Internal Medicine

## 2017-05-17 MED ORDER — ACCU-CHEK GUIDE W/DEVICE KIT: 1.0000 | PACK | Freq: Once | 0 refills | Status: AC

## 2017-05-17 MED ORDER — SITAGLIPTIN PHOSPHATE 100 MG PO TABS: 100.0000 mg | ORAL_TABLET | Freq: Every day | ORAL | 0 refills | Status: DC

## 2017-05-17 MED ORDER — GLUCOSE BLOOD VI STRP: ORAL_STRIP | 2 refills | Status: DC

## 2017-05-17 MED ORDER — ACCU-CHEK MULTICLIX LANCET DEV KIT: PACK | 12 refills | Status: DC

## 2017-05-17 NOTE — Telephone Encounter (Signed)
OK for AccuChek and Januvia 100 mg.

## 2017-05-17 NOTE — Telephone Encounter (Signed)
Please advise on below with the medication switch

## 2017-05-17 NOTE — Telephone Encounter (Signed)
Sent!

## 2017-05-17 NOTE — Telephone Encounter (Signed)
Patient's insurance has approved Accucheck.  Insurance company NO longer covers [place]- but WILL cover [place] Patient wants his information updated so that when he needs his medication his pharmacy will already have it. [facility] [place]

## 2017-06-16 ENCOUNTER — Telehealth: Payer: Self-pay | Admitting: Internal Medicine

## 2017-06-20 NOTE — Telephone Encounter (Signed)
error 

## 2017-06-29 ENCOUNTER — Telehealth: Payer: Self-pay | Admitting: Internal Medicine

## 2017-06-29 NOTE — Telephone Encounter (Signed)
Todd from [employer] calling stated they need PA for medication Trajenta.  [facility] [phone]

## 2017-07-03 NOTE — Telephone Encounter (Signed)
Called patient to confirm if he is on Tradjenta or Januvia? No answer, will try later.

## 2017-07-03 NOTE — Telephone Encounter (Signed)
Patient returned call. He states he is still taking Tradjenta that he had left over. Unable to afford Januvia. Would like to pursue PA for Tradjenta.

## 2017-07-10 NOTE — Telephone Encounter (Addendum)
Tried to submit PA through [employer] for Tradjenta. Unable to upload Office Visit notes Called insurance unable to speak w/ rep, b/c option not available.  Patient has not tried and failed, or does not have contraindication to Januvia.  Called pt to inform. No answer.

## 2017-07-11 NOTE — Telephone Encounter (Signed)
Spoke to patient. Unable to afford Januvia. He says he has a 6 month supply of Tradjenta. Will discuss in upcoming appt. Confirmed f/u appt in May.

## 2017-07-11 NOTE — Telephone Encounter (Signed)
OK to use Januvia 100 mg instead.

## 2017-07-25 ENCOUNTER — Telehealth: Payer: Self-pay | Admitting: Internal Medicine

## 2017-07-25 NOTE — Telephone Encounter (Signed)
[facility] ph[facility] [phone] re: they need PA for Tradjenta 5 mg (this is required by insurance) they called last week about this-they have [place] (Dr. Cruzita Lederer had prescribed in February) that does not require PA-Please call Pharmacy to advise

## 2017-07-26 NOTE — Telephone Encounter (Addendum)
Returned call to pharmacy. Advised pt decided to hold off on Tradjenta and Januvia per previous notes.

## 2017-09-06 ENCOUNTER — Ambulatory Visit (INDEPENDENT_AMBULATORY_CARE_PROVIDER_SITE_OTHER): Payer: BLUE CROSS/BLUE SHIELD | Admitting: Internal Medicine

## 2017-09-06 ENCOUNTER — Encounter: Payer: Self-pay | Admitting: Internal Medicine

## 2017-09-06 VITALS — BP 142/90 | HR 68 | Ht 69.0 in | Wt 152.0 lb

## 2017-09-06 DIAGNOSIS — E1169 Type 2 diabetes mellitus with other specified complication: Secondary | ICD-10-CM

## 2017-09-06 DIAGNOSIS — E119 Type 2 diabetes mellitus without complications: Secondary | ICD-10-CM

## 2017-09-06 DIAGNOSIS — E785 Hyperlipidemia, unspecified: Secondary | ICD-10-CM

## 2017-09-06 LAB — POCT GLYCOSYLATED HEMOGLOBIN (HGB A1C): Hemoglobin A1C: 6.1 % — AB (ref 4.0–5.6)

## 2017-09-06 NOTE — Patient Instructions (Addendum)
Please continue: Medicine  Breakfast Lunch Dinner  Metformin M  M  Glipizide   G -5  Glipizide XL G XL -5     Farxiga F    Tradjenta  T    Please return in 6 months with your sugar log.   

## 2017-09-06 NOTE — Progress Notes (Signed)
Patient ID: William Nixon, male   DOB: 01-15-1961, 57 y.o.   MRN: [id number]  HPI: William Nixon is a 57 y.o.-year-old male, returning for f/u DM2, dx in ~1998, non-insulin-dependent, controlled, without long-term complications. Last visit 6 months ago.  Last hemoglobin A1c was: Lab Results  Component Value Date   HGBA1C 6.1 01/10/2017   HGBA1C 6.1 08/31/2016   HGBA1C 6.3 04/27/2016  During summer months, as he is more active, his HbA1c is usually lower.  Pt is on: Medicine  Breakfast Lunch Dinner  Metformin M  M  Glipizide   G - 5  Glipizide XL G XL - 5    Farxiga F    Tradjenta  T    He also tried herbal remedies: - mustard seed daily >> helps a lot - bitter melon once a week.  Pt checks his sugars 1-2X a day - am:  93, 97-131 >> 90, 110-118, 130 >> 90-125 - 2h after b'fast: n/c  - before lunch: n/c >> 103-165 >> n/c - 2h after lunch: 153-179, 187, 205 x1 >> 157-165 >> 129-179 - before dinner: n/c >> 92-114 - 2h after dinner:160-180 >> 176-188 >> 170-175 >> 142-179 - bedtime: n/c - nighttime: n/c Lowest sugar was 90 >> 90; he has hypoglycemia awareness in the 80s. Highest sugar was 175 >> 179.  Glucometer: AccuChek (?[employer])  His meals are mostly vegetarian: - Breakfast: 2 eggs (!) + whole grain bread - Lunch: [place] bread, tortilla, and rice, vegetables - Dinner: bread and vegetables and rice - Snacks: no  He exercises consistently: Running 4 to 5 miles all days of the week >> runs 4-5 mi all days of the week and lifting weights twice a week  -No CKD, last BUN/creatinine:  Lab Results  Component Value Date   BUN 10 01/10/2017   CREATININE 0.78 01/10/2017  On losartan 50.  No MAU: Lab Results  Component Value Date   MICRALBCREAT 2.5 01/10/2017   MICRALBCREAT 2.1 01/06/2016   MICRALBCREAT 2.3 09/10/2014   MICRALBCREAT 0.0 04/30/2014   MICRALBCREAT 0.6 10/02/2013   MICRALBCREAT 1.3 05/29/2013   MICRALBCREAT 0.8 11/07/2012   MICRALBCREAT 0.7  11/02/2011   MICRALBCREAT 1.2 04/27/2011   MICRALBCREAT 0.7 10/27/2010   -+ HL; last set of lipids: Lab Results  Component Value Date   CHOL 177 01/10/2017   HDL 25.70 (L) 01/10/2017   LDLCALC 114 (H) 01/10/2017   LDLDIRECT 96.9 11/07/2012   TRIG 185.0 (H) 01/10/2017   CHOLHDL 7 01/10/2017  He is not on a statin. - last eye exam was in 04/2017: No DR (Dr. Deloria Lair).  - He denies numbness and tingling in his feet.  He started a new job in 06/2016.  ROS: Constitutional: no weight gain/no weight loss, no fatigue, no subjective hyperthermia, no subjective hypothermia Eyes: no blurry vision, no xerophthalmia ENT: no sore throat, no nodules palpated in throat, no dysphagia, no odynophagia, no hoarseness Cardiovascular: no CP/no SOB/no palpitations/no leg swelling Respiratory: no cough/no SOB/no wheezing Gastrointestinal: no N/no V/no D/no C/no acid reflux Musculoskeletal: no muscle aches/no joint aches Skin: no rashes, no hair loss Neurological: no tremors/no numbness/no tingling/no dizziness  I reviewed pt's medications, allergies, PMH, social hx, family hx, and changes were documented in the history of present illness. Otherwise, unchanged from my initial visit note.  Past Medical History:  Diagnosis Date  . Diabetes mellitus    type II  . Hyperlipidemia    pt. states that hdl ldl was elevated denies hyperlipidemia  .  Hypertension   . Hyperthyroidism    pt. denies having any thyroid issues  . Personal history of colonic polyps - adenomas 08/06/2013   07/2013 - 3 diminutive adenomas - repeat colonoscopy 2018  . Right hydrocele   . S/P ACL repair    right    Past Surgical History:  Procedure Laterality Date  . COLONOSCOPY    . KNEE SURGERY  2002   right  . POLYPECTOMY     History   Social History  . Marital Status: Married    Spouse Name: N/A    Number of Children: 1   Occupational History  . controller   Social History Main Topics  . Smoking status: Never  Smoker   . Smokeless tobacco: Never Used  . Alcohol Use:     1 drink(s) per week  . Drug Use: No   Current Outpatient Medications on File Prior to Visit  Medication Sig Dispense Refill  . Continuous Blood Gluc Sensor (FREESTYLE LIBRE 14 DAY SENSOR) MISC 1 each every 14 (fourteen) days by Does not apply route. Change every 2 weeks 2 each 11  . dapagliflozin propanediol (FARXIGA) 10 MG TABS tablet Take 10 mg daily by mouth. 90 tablet 3  . glipiZIDE (GLUCOTROL XL) 5 MG 24 hr tablet TAKE 1 TABLET DAILY WITH SUPPER 90 tablet 3  . glipiZIDE (GLUCOTROL) 5 MG tablet Take 1 tablet (5 mg total) daily with breakfast by mouth. 90 tablet 3  . glucose blood (ACCU-CHEK GUIDE) test strip USE TO TEST BLOOD SUGAR 3 TIMES DAILY AS DIRECTED VARY TESTING TIMES *DX E11.9* 300 each 2  . Lancets Misc. (ACCU-CHEK MULTICLIX LANCET DEV) KIT USE TO TEST BLOOD SUGAR 3 TIMES DAILY AS DIRECTED VARY TESTING TIMES *DX E11.9* 3 each 12  . losartan (COZAAR) 100 MG tablet Take 1 tablet (100 mg total) by mouth daily. 90 tablet 4  . metFORMIN (GLUCOPHAGE) 1000 MG tablet TAKE 1 TABLET (1,000 MG TOTAL) BY MOUTH 2 (TWO) TIMES DAILY. 180 tablet 3  . Multiple Vitamin (MULTIVITAMIN WITH MINERALS) TABS Take 1 tablet by mouth daily. Centrum    . ONETOUCH DELICA LANCETS 36O MISC 1 each by Does not apply route daily. 100 each 3  . sildenafil (REVATIO) 20 MG tablet Take 1 tablet (20 mg total) by mouth at bedtime as needed. 30 tablet 11  . sitaGLIPtin (JANUVIA) 100 MG tablet Take 1 tablet (100 mg total) by mouth daily. 90 tablet 0   Current Facility-Administered Medications on File Prior to Visit  Medication Dose Route Frequency Provider Last Rate Last Dose  . 0.9 %  sodium chloride infusion  500 mL Intravenous Continuous Gatha Mayer, MD      . 0.9 %  sodium chloride infusion  500 mL Intravenous Continuous Gatha Mayer, MD       No Known Allergies Family History  Problem Relation Age of Onset  . Alcohol abuse Other   . Diabetes  Other   . Hypertension Other   . Colon cancer Neg Hx   . Rectal cancer Neg Hx   . Stomach cancer Neg Hx   . Esophageal cancer Neg Hx   . Pancreatic cancer Neg Hx   . Colon polyps Neg Hx    PE: BP (!) 142/90   Pulse 68   Ht 5' 9" (1.753 m)   Wt 152 lb (68.9 kg)   SpO2 98%   BMI 22.45 kg/m  Body mass index is 22.45 kg/m. Wt Readings from Last 3 Encounters:  09/06/17 152 lb (68.9 kg)  03/06/17 152 lb (68.9 kg)  01/10/17 149 lb (67.6 kg)   Constitutional: normal weight, in NAD Eyes: PERRLA, EOMI, no exophthalmos ENT: moist mucous membranes, no thyromegaly, no cervical lymphadenopathy Cardiovascular: RRR, No MRG Respiratory: CTA B Gastrointestinal: abdomen soft, NT, ND, BS+ Musculoskeletal: no deformities, strength intact in all 4 Skin: moist, warm, no rashes Neurological: no tremor with outstretched hands, DTR normal in all 4   ASSESSMENT: 1. DM2, non-insulin-dependent, controlled, without complications - also FH of mild DM on mother's side  2. HL  PLAN:  1. Patient with long-standing, well-controlled diabetes, on oral antidiabetic regimen.  He is very conscientious.  He exercises and eats a mostly vegetarian diet.  However, at last visit, he was having to eggs every morning so we discussed about reducing these especially since he also has hyperlipidemia.  Otherwise, we did not change his regimen.  His HbA1c at that time was 6.1%, great! - at this visit, sugars are stable, only with occasional spikes close to 180 after meals, but not consistent - no change is needed in his med regimen - I suggested to:  Patient Instructions   Please continue:  Medicine  Breakfast Lunch Dinner  Metformin M  M  Glipizide   G - 5  Glipizide XL G XL - 5    Farxiga F    Tradjenta  T    Please return in 6 months with your sugar log.   - he gets Tradjenta from [place] >> much cheaper - today, HbA1c is 6.1% (stable, excellent) - continue checking sugars at different times of the day -  check 1x a day, rotating checks - advised for yearly eye exams >> he is UTD - Return to clinic in 6 mo with sugar log   2. HL - Reviewed latest lipid panel from 12/2016: LDL was worse.  Total cholesterol was 177 and we discussed that ideally this would be lower than 150.  I advised him to cut out eggs >> he did so - will have another lipid panel by PCP in 12/2017 - He is not on a statin.  If next lipid panel has not improved significantly, I would strongly suggest to start a statin.   Cristina Gherghe, MD PhD [facility]  

## 2017-10-16 ENCOUNTER — Telehealth: Payer: Self-pay | Admitting: Family Medicine

## 2017-10-16 NOTE — Telephone Encounter (Signed)
[facility] Physician Results Form to be filled out.  Placed in dr's folder.  Call patient at [phone] upon completion.

## 2017-10-18 NOTE — Telephone Encounter (Signed)
Pt came in to pick up the completed form (No Charge) and a copy was sent to the scan center to be scanned

## 2017-10-18 NOTE — Telephone Encounter (Signed)
Form is complete and pt is aware to pick up forms at the office.

## 2018-01-10 ENCOUNTER — Other Ambulatory Visit: Payer: Self-pay | Admitting: Family Medicine

## 2018-01-12 ENCOUNTER — Other Ambulatory Visit: Payer: Self-pay | Admitting: Family Medicine

## 2018-01-15 ENCOUNTER — Telehealth: Payer: Self-pay | Admitting: Family Medicine

## 2018-01-15 NOTE — Telephone Encounter (Signed)
Copied from [place] [id number]. Topic: Quick Communication - Rx Refill/Question >> Jan 15, 2018  9:27 AM Oliver Pila B wrote: Medication: losartan (COZAAR) 100 MG tablet [[id number]]   [facility]; contact if needed

## 2018-01-15 NOTE — Telephone Encounter (Signed)
Rx has already been refilled 01/12/2018. No further action needed!

## 2018-01-15 NOTE — Telephone Encounter (Signed)
Wrong office. Sending to right office.  °

## 2018-01-16 ENCOUNTER — Ambulatory Visit (INDEPENDENT_AMBULATORY_CARE_PROVIDER_SITE_OTHER): Payer: BLUE CROSS/BLUE SHIELD | Admitting: Family Medicine

## 2018-01-16 ENCOUNTER — Encounter: Payer: Self-pay | Admitting: Family Medicine

## 2018-01-16 VITALS — BP 120/82 | HR 69 | Temp 98.2°F | Ht 68.5 in | Wt 151.5 lb

## 2018-01-16 DIAGNOSIS — E785 Hyperlipidemia, unspecified: Secondary | ICD-10-CM

## 2018-01-16 DIAGNOSIS — E1159 Type 2 diabetes mellitus with other circulatory complications: Secondary | ICD-10-CM

## 2018-01-16 DIAGNOSIS — Z0000 Encounter for general adult medical examination without abnormal findings: Secondary | ICD-10-CM | POA: Diagnosis not present

## 2018-01-16 DIAGNOSIS — Z136 Encounter for screening for cardiovascular disorders: Secondary | ICD-10-CM

## 2018-01-16 DIAGNOSIS — E119 Type 2 diabetes mellitus without complications: Secondary | ICD-10-CM | POA: Diagnosis not present

## 2018-01-16 DIAGNOSIS — Z125 Encounter for screening for malignant neoplasm of prostate: Secondary | ICD-10-CM

## 2018-01-16 DIAGNOSIS — E1169 Type 2 diabetes mellitus with other specified complication: Secondary | ICD-10-CM

## 2018-01-16 DIAGNOSIS — I10 Essential (primary) hypertension: Secondary | ICD-10-CM

## 2018-01-16 DIAGNOSIS — I152 Hypertension secondary to endocrine disorders: Secondary | ICD-10-CM

## 2018-01-16 LAB — CBC WITH DIFFERENTIAL/PLATELET
BASOS PCT: 0.3 % (ref 0.0–3.0)
Basophils Absolute: 0 10*3/uL (ref 0.0–0.1)
EOS ABS: 0.1 10*3/uL (ref 0.0–0.7)
Eosinophils Relative: 3.9 % (ref 0.0–5.0)
HCT: 44.2 % (ref 39.0–52.0)
HEMOGLOBIN: 15.5 g/dL (ref 13.0–17.0)
Lymphocytes Relative: 38 % (ref 12.0–46.0)
Lymphs Abs: 1.4 10*3/uL (ref 0.7–4.0)
MCHC: 34.9 g/dL (ref 30.0–36.0)
MCV: 88.2 fl (ref 78.0–100.0)
Monocytes Absolute: 0.3 10*3/uL (ref 0.1–1.0)
Monocytes Relative: 8.3 % (ref 3.0–12.0)
Neutro Abs: 1.8 10*3/uL (ref 1.4–7.7)
Neutrophils Relative %: 49.5 % (ref 43.0–77.0)
Platelets: 136 10*3/uL — ABNORMAL LOW (ref 150.0–400.0)
RBC: 5.01 Mil/uL (ref 4.22–5.81)
RDW: 13.6 % (ref 11.5–15.5)
WBC: 3.6 10*3/uL — ABNORMAL LOW (ref 4.0–10.5)

## 2018-01-16 LAB — POCT URINALYSIS DIPSTICK
BILIRUBIN UA: NEGATIVE
Glucose, UA: POSITIVE — AB
Ketones, UA: NEGATIVE
LEUKOCYTES UA: NEGATIVE
Nitrite, UA: NEGATIVE
ODOR: NEGATIVE
PH UA: 6 (ref 5.0–8.0)
Protein, UA: NEGATIVE
RBC UA: NEGATIVE
Spec Grav, UA: 1.015 (ref 1.010–1.025)
UROBILINOGEN UA: 0.2 U/dL

## 2018-01-16 LAB — HEPATIC FUNCTION PANEL
ALT: 28 U/L (ref 0–53)
AST: 25 U/L (ref 0–37)
Albumin: 4.8 g/dL (ref 3.5–5.2)
Alkaline Phosphatase: 68 U/L (ref 39–117)
BILIRUBIN DIRECT: 0.1 mg/dL (ref 0.0–0.3)
TOTAL PROTEIN: 7.3 g/dL (ref 6.0–8.3)
Total Bilirubin: 0.7 mg/dL (ref 0.2–1.2)

## 2018-01-16 LAB — LIPID PANEL
CHOLESTEROL: 163 mg/dL (ref 0–200)
HDL: 27 mg/dL — ABNORMAL LOW (ref >39.00)
LDL CALC: 103 mg/dL — AB (ref 0–99)
NonHDL: 136.34
TRIGLYCERIDES: 165 mg/dL — AB (ref 0.0–149.0)
Total CHOL/HDL Ratio: 6
VLDL: 33 mg/dL (ref 0.0–40.0)

## 2018-01-16 LAB — BASIC METABOLIC PANEL
BUN: 15 mg/dL (ref 6–23)
CHLORIDE: 102 meq/L (ref 96–112)
CO2: 29 meq/L (ref 19–32)
CREATININE: 0.88 mg/dL (ref 0.40–1.50)
Calcium: 9.3 mg/dL (ref 8.4–10.5)
GFR: 95.07 mL/min (ref >60.00)
GLUCOSE: 134 mg/dL — AB (ref 70–99)
Potassium: 4.4 mEq/L (ref 3.5–5.1)
Sodium: 139 mEq/L (ref 135–145)

## 2018-01-16 LAB — PSA: PSA: 1.43 ng/mL (ref 0.10–4.00)

## 2018-01-16 LAB — MICROALBUMIN / CREATININE URINE RATIO
Creatinine,U: 65.5 mg/dL
Microalb Creat Ratio: 2.1 mg/g (ref 0.0–30.0)
Microalb, Ur: 1.4 mg/dL (ref 0.0–1.9)

## 2018-01-16 LAB — HEMOGLOBIN A1C: HEMOGLOBIN A1C: 6.3 % (ref 4.6–6.5)

## 2018-01-16 LAB — TSH: TSH: 5.19 u[IU]/mL — AB (ref 0.35–4.50)

## 2018-01-16 MED ORDER — SILDENAFIL CITRATE 20 MG PO TABS: 20.0000 mg | ORAL_TABLET | Freq: Every evening | ORAL | 11 refills | Status: DC | PRN

## 2018-01-16 MED ORDER — LOSARTAN POTASSIUM 100 MG PO TABS: 100.0000 mg | ORAL_TABLET | Freq: Every day | ORAL | 4 refills | Status: DC

## 2018-01-16 NOTE — Progress Notes (Signed)
William Nixon is a delightful 57 year old male non-smoker who comes in for annual physical examination because of a history of diabetes, mild hypertension and mild ED  His last A1c in May was 6.1%.  His blood sugars average 110 120.  He runs 4 miles per day.  He takes a combination of medications as outlined in his treatment program.  He is also seen and followed in endocrinology.  He said no diabetic complications.  His blood pressure is normal 120/80 on Cozaar 1 tablet daily.  He has mild ED for uses generic Viagra  He gets routine eye care, dental care, colonoscopy was 2018 was normal.  His original colonoscopy was 51 showed some polyps x3.  His last colonoscopy was clean however he was advised to come back in 5 years  Vaccinations up-to-date  14 point review of system reviewed otherwise negative  EKG was done because of a history of diabetes hypertension.  EKG was normal and unchanged  Social history.........Marland Kitchen originally from [place].  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  BP 120/82 (BP Location: Right Arm, Patient Position: Sitting, Cuff Size: Normal)   Pulse 69   Temp 98.2 F (36.8 C) (Oral)   Ht 5' 8.5" (1.74 m)   Wt 151 lb 8 oz (68.7 kg)   SpO2 99%   BMI 22.70 kg/m  Well-developed well-nourished male no acute distress vital signs stable he is afebrile examination of the head eyes ears nose and throat were negative neck was supple thyroid is not enlarged no carotid bruits.  Cardiopulmonary exam normal.  Abdominal exam normal.  Genitalia normal circumcised male rectal normal stool guaiac negative prostate smooth nonnodular normal.  Extremities normal skin normal peripheral pulses normal  1.  Diabetes at goal........ continue diet exercise medications  2.  Hypertension at goal.....Marland Kitchen continue Cozaar  3.  Mild ED.....Marland Kitchen continue generic Viagra

## 2018-01-16 NOTE — Patient Instructions (Signed)
Labs today...........Marland Kitchen we will call you if there is anything abnormal  Continue good diet exercise medication and health habits  See Dr. Lorie Apley in May for follow-up

## 2018-02-21 ENCOUNTER — Other Ambulatory Visit: Payer: Self-pay

## 2018-02-24 ENCOUNTER — Other Ambulatory Visit: Payer: Self-pay | Admitting: Internal Medicine

## 2018-02-26 ENCOUNTER — Other Ambulatory Visit: Payer: Self-pay | Admitting: Internal Medicine

## 2018-03-21 ENCOUNTER — Ambulatory Visit: Payer: BLUE CROSS/BLUE SHIELD | Admitting: Internal Medicine

## 2018-03-27 ENCOUNTER — Other Ambulatory Visit: Payer: Self-pay | Admitting: Internal Medicine

## 2018-04-12 ENCOUNTER — Encounter: Payer: Self-pay | Admitting: Internal Medicine

## 2018-04-12 ENCOUNTER — Ambulatory Visit (INDEPENDENT_AMBULATORY_CARE_PROVIDER_SITE_OTHER): Payer: BLUE CROSS/BLUE SHIELD | Admitting: Internal Medicine

## 2018-04-12 VITALS — BP 110/80 | HR 60 | Ht 68.5 in | Wt 150.0 lb

## 2018-04-12 DIAGNOSIS — E119 Type 2 diabetes mellitus without complications: Secondary | ICD-10-CM

## 2018-04-12 DIAGNOSIS — E1169 Type 2 diabetes mellitus with other specified complication: Secondary | ICD-10-CM | POA: Diagnosis not present

## 2018-04-12 DIAGNOSIS — E785 Hyperlipidemia, unspecified: Secondary | ICD-10-CM | POA: Diagnosis not present

## 2018-04-12 LAB — POCT GLYCOSYLATED HEMOGLOBIN (HGB A1C): Hemoglobin A1C: 6.4 % — AB (ref 4.0–5.6)

## 2018-04-12 MED ORDER — GLIPIZIDE 5 MG PO TABS: 5.0000 mg | ORAL_TABLET | Freq: Every day | ORAL | 3 refills | Status: DC

## 2018-04-12 NOTE — Addendum Note (Signed)
Addended by: Cardell Peach I on: 04/12/2018 01:33 PM   Modules accepted: Orders

## 2018-04-12 NOTE — Progress Notes (Signed)
Patient ID: William Nixon, male   DOB: July 02, 1960, 57 y.o.   MRN: [id number]  HPI: William Nixon is a 57 y.o.-year-old male, returning for f/u DM2, dx in ~1998, non-insulin-dependent, controlled, without long-term complications. Last visit 7 months ago.  He just returned from [place], where he went for 2 weeks around Thanksgiving.  Sugars are usually better when he is there.  Reviewed HbA1c levels: Lab Results  Component Value Date   HGBA1C 6.3 01/16/2018   HGBA1C 6.1 (A) 09/06/2017   HGBA1C 6.1 01/10/2017  During summer months, as he is more active, his HbA1c is usually lower.  Pt is on: Medicine  Breakfast Lunch Dinner  Metformin M  M  Glipizide   G -5  Glipizide XL G XL -5     Farxiga F    Tradjenta  T    He also tried herbal remedies: - mustard seed daily >> helps a lot - bitter melon once a week.  Pt checks his sugars 1-2 times a day: - am:  90, 110-118, 130 >> 90-125 >> 92-115, 121 - 2h after b'fast: n/c  - before lunch: n/c >> 103-165 >> n/c - 2h after lunch: 157-165 >> 129-179 >> 160-165, 170 - before dinner: n/c >> 92-114 >> n/c - 2h after dinner: 170-175 >> 142-179 >> 170-180 - bedtime: n/c - nighttime: n/c Lowest sugar was 90 >> 90 >> 80s while in [place] - he was there 1 mo ago; he has hypoglycemia awareness in the 29s. Highest sugar was 175 >> 179 >> 180  Glucometer: AccuChek (?Aviva)  His meals are mostly vegetarian: - Breakfast: + whole grain bread - Lunch: [place] bread, tortilla, and rice, vegetables - Dinner: bread and vegetables and rice - Snacks: no  He exercises consistently: Running 4 to 5 miles all days of the week and lifting weights twice a week.  -No CKD, last BUN/creatinine:  Lab Results  Component Value Date   BUN 15 01/16/2018   CREATININE 0.88 01/16/2018  On losartan 50.  -No MAU: Lab Results  Component Value Date   MICRALBCREAT 2.1 01/16/2018   MICRALBCREAT 2.5 01/10/2017   MICRALBCREAT 2.1 01/06/2016   MICRALBCREAT 2.3  09/10/2014   MICRALBCREAT 0.0 04/30/2014   MICRALBCREAT 0.6 10/02/2013   MICRALBCREAT 1.3 05/29/2013   MICRALBCREAT 0.8 11/07/2012   MICRALBCREAT 0.7 11/02/2011   MICRALBCREAT 1.2 04/27/2011   -+ HL; last set of lipids: Lab Results  Component Value Date   CHOL 163 01/16/2018   HDL 27.00 (L) 01/16/2018   LDLCALC 103 (H) 01/16/2018   LDLDIRECT 96.9 11/07/2012   TRIG 165.0 (H) 01/16/2018   CHOLHDL 6 01/16/2018  He is not on a statin. - last eye exam was in 04/2017: No DR (Dr. Deloria Lair).  - He denies numbness and tingling in his feet.  He started a new job in 06/2016.  ROS: Constitutional: no weight gain/no weight loss, no fatigue, no subjective hyperthermia, no subjective hypothermia Eyes: no blurry vision, no xerophthalmia ENT: no sore throat, no nodules palpated in neck, no dysphagia, no odynophagia, no hoarseness Cardiovascular: no CP/no SOB/no palpitations/no leg swelling Respiratory: no cough/no SOB/no wheezing Gastrointestinal: no N/no V/no D/no C/no acid reflux Musculoskeletal: no muscle aches/no joint aches Skin: no rashes, no hair loss Neurological: no tremors/no numbness/no tingling/no dizziness  I reviewed pt's medications, allergies, PMH, social hx, family hx, and changes were documented in the history of present illness. Otherwise, unchanged from my initial visit note.  Past Medical History:  Diagnosis Date  .  Diabetes mellitus    type II  . Hyperlipidemia    pt. states that hdl ldl was elevated denies hyperlipidemia  . Hypertension   . Hyperthyroidism    pt. denies having any thyroid issues  . Personal history of colonic polyps - adenomas 08/06/2013   07/2013 - 3 diminutive adenomas - repeat colonoscopy 2018  . Right hydrocele   . S/P ACL repair    right    Past Surgical History:  Procedure Laterality Date  . COLONOSCOPY    . KNEE SURGERY  2002   right  . POLYPECTOMY     History   Social History  . Marital Status: Married    Spouse Name: N/A     Number of Children: 1   Occupational History  . controller   Social History Main Topics  . Smoking status: Never Smoker   . Smokeless tobacco: Never Used  . Alcohol Use:     1 drink(s) per week  . Drug Use: No   Current Outpatient Medications on File Prior to Visit  Medication Sig Dispense Refill  . FARXIGA 10 MG TABS tablet TAKE 1 TABLET EVERY DAY 90 tablet 3  . glipiZIDE (GLUCOTROL XL) 5 MG 24 hr tablet TAKE 1 TABLET DAILY WITH SUPPER 90 tablet 3  . glipiZIDE (GLUCOTROL) 5 MG tablet Take 1 tablet (5 mg total) daily with breakfast by mouth. 90 tablet 3  . glucose blood (ACCU-CHEK GUIDE) test strip USE TO TEST BLOOD SUGAR 3 TIMES DAILY AS DIRECTED VARY TESTING TIMES *DX E11.9* 300 each 2  . Lancets Misc. (ACCU-CHEK MULTICLIX LANCET DEV) KIT USE TO TEST BLOOD SUGAR 3 TIMES DAILY AS DIRECTED VARY TESTING TIMES *DX E11.9* 3 each 12  . linagliptin (TRADJENTA) 5 MG TABS tablet Take 5 mg by mouth daily.    Marland Kitchen losartan (COZAAR) 100 MG tablet Take 1 tablet (100 mg total) by mouth daily. 90 tablet 4  . metFORMIN (GLUCOPHAGE) 1000 MG tablet TAKE 1 TABLET BY MOUTH TWICE A DAY 180 tablet 3  . Multiple Vitamin (MULTIVITAMIN WITH MINERALS) TABS Take 1 tablet by mouth daily. Centrum    . ONETOUCH DELICA LANCETS 19J MISC 1 each by Does not apply route daily. 100 each 3  . sildenafil (REVATIO) 20 MG tablet Take 1 tablet (20 mg total) by mouth at bedtime as needed. 30 tablet 11   Current Facility-Administered Medications on File Prior to Visit  Medication Dose Route Frequency Provider Last Rate Last Dose  . 0.9 %  sodium chloride infusion  500 mL Intravenous Continuous Gatha Mayer, MD      . 0.9 %  sodium chloride infusion  500 mL Intravenous Continuous Gatha Mayer, MD       No Known Allergies Family History  Problem Relation Age of Onset  . Alcohol abuse Other   . Diabetes Other   . Hypertension Other   . Colon cancer Neg Hx   . Rectal cancer Neg Hx   . Stomach cancer Neg Hx   .  Esophageal cancer Neg Hx   . Pancreatic cancer Neg Hx   . Colon polyps Neg Hx    PE: BP 110/80   Pulse 60   Ht 5' 8.5" (1.74 m) Comment: measured  Wt 150 lb (68 kg)   SpO2 98%   BMI 22.48 kg/m  Body mass index is 22.7 kg/m. Wt Readings from Last 3 Encounters:  04/12/18 150 lb (68 kg)  01/16/18 151 lb 8 oz (68.7 kg)  09/06/17  152 lb (68.9 kg)   Constitutional: Normal weight, in NAD Eyes: PERRLA, EOMI, no exophthalmos ENT: moist mucous membranes, no thyromegaly, no cervical lymphadenopathy Cardiovascular: RRR, No MRG Respiratory: CTA B Gastrointestinal: abdomen soft, NT, ND, BS+ Musculoskeletal: no deformities, strength intact in all 4 Skin: moist, warm, no rashes Neurological: no tremor with outstretched hands, DTR normal in all 4   ASSESSMENT: 1. DM2, non-insulin-dependent, controlled, without complications - also FH of mild DM on mother's side  2. HL  PLAN:  1. Patient with longstanding, well-controlled, type 2 diabetes, on oral antidiabetic regimen.  He is very contentious, exercising regularly and eating a mostly vegetarian diet.  We discussed in the past about cutting out eggs, which he did.  His HbA1c is usually slightly higher during the winter.  At last visit he was having occasional spikes close to 180 after meals, but not consistently.  We did not change his regimen then. -At this visit, sugars are unchanged from before except for after dinner, where he sees slightly higher CBGs.  We discussed about trying to adjust dinner, but I do not feel any other changes are necessary in his diabetic regimen for now. - I suggested to:  Patient Instructions   Please continue: Medicine  Breakfast Lunch Dinner  Metformin M  M  Glipizide   G -5  Glipizide XL G XL -5     Farxiga F    Tradjenta  T    Please return in 6 months with your sugar log.   - he gets [place] and [place] from [place] >> much cheaper - today, HbA1c is 6.4% (higher) - continue checking sugars at  different times of the day - check 1x a day, rotating checks - advised for yearly eye exams >> he is UTD - Return to clinic in 6 mo with sugar log    2. HL - Reviewed latest lipid panel from 2.5 months ago: LDL above target, HDL slightly low, triglycerides slightly high.  Total cholesterol improved from 177 to 163. Lab Results  Component Value Date   CHOL 163 01/16/2018   HDL 27.00 (L) 01/16/2018   LDLCALC 103 (H) 01/16/2018   LDLDIRECT 96.9 11/07/2012   TRIG 165.0 (H) 01/16/2018   CHOLHDL 6 01/16/2018  -He is not on a statin.  However, we discussed that I would recommend one.  I explained the reasons why I would suggest even a low-dose statin.  He will think about it and let me know   Philemon Kingdom, MD PhD [facility]

## 2018-04-12 NOTE — Patient Instructions (Signed)
Please continue: Medicine  Breakfast Lunch Dinner  Metformin M  M  Glipizide   G -5  Glipizide XL G XL -5     Farxiga F    Tradjenta  T    Please return in 6 months with your sugar log.

## 2018-05-02 DIAGNOSIS — E119 Type 2 diabetes mellitus without complications: Secondary | ICD-10-CM | POA: Diagnosis not present

## 2018-05-02 DIAGNOSIS — H43823 Vitreomacular adhesion, bilateral: Secondary | ICD-10-CM | POA: Diagnosis not present

## 2018-05-02 DIAGNOSIS — H2513 Age-related nuclear cataract, bilateral: Secondary | ICD-10-CM | POA: Diagnosis not present

## 2018-05-02 LAB — HM DIABETES EYE EXAM

## 2018-05-03 ENCOUNTER — Telehealth: Payer: Self-pay | Admitting: Internal Medicine

## 2018-05-03 MED ORDER — GLIPIZIDE 5 MG PO TABS: 5.0000 mg | ORAL_TABLET | Freq: Every day | ORAL | 3 refills | Status: DC

## 2018-05-03 NOTE — Telephone Encounter (Signed)
Patient's insurance DENIED this prescription, we sent in a PA for it and they also DENIED that.  I let patient know we could not get it approved and also let him know a 90 day supply on GoodRX is less than ten dollars at many local pharmacies. Patient would like 90 day supply sent to [facility].  RX sent.

## 2018-05-03 NOTE — Telephone Encounter (Signed)
Patient is requesting a call back to discuss his medication . He stated the pharmacy has sent [place] some information over the fax requesting the doctors approval for this medication.     glipiZIDE (GLUCOTROL) 5 MG tablet

## 2018-05-08 ENCOUNTER — Telehealth: Payer: Self-pay | Admitting: Internal Medicine

## 2018-05-08 MED ORDER — LINAGLIPTIN 5 MG PO TABS: 5.0000 mg | ORAL_TABLET | Freq: Every day | ORAL | 1 refills | Status: DC

## 2018-05-08 MED ORDER — EMPAGLIFLOZIN 25 MG PO TABS: 25.0000 mg | ORAL_TABLET | Freq: Every day | ORAL | 1 refills | Status: DC

## 2018-05-08 NOTE — Telephone Encounter (Signed)
Spoke to patient, his insurance will no longer cover [place] but will cover Jardiance.  Please advise.

## 2018-05-08 NOTE — Telephone Encounter (Signed)
Patient would like a call back regarding his medications and the ones that is are covered and not covered by insurance.  Wants to discuss Rx substitution

## 2018-05-08 NOTE — Telephone Encounter (Signed)
New RX sent, pt notified

## 2018-05-08 NOTE — Telephone Encounter (Signed)
OK to change to Jardiance 25 mg.

## 2018-05-09 ENCOUNTER — Telehealth: Payer: Self-pay | Admitting: Internal Medicine

## 2018-05-09 MED ORDER — DAPAGLIFLOZIN PROPANEDIOL 10 MG PO TABS: 10.0000 mg | ORAL_TABLET | Freq: Every day | ORAL | 1 refills | Status: DC

## 2018-05-09 NOTE — Telephone Encounter (Signed)
Patient states that Vania Rea is NOT covered as he was told by the pharmacy. Wilder Glade is covered so we have sent that in again.

## 2018-05-09 NOTE — Telephone Encounter (Signed)
Patient stated that he is needing an approval sent through the insurance in order for him to get his new medication that Dr Cruzita Lederer just prescribed yesterday

## 2018-05-10 ENCOUNTER — Encounter: Payer: Self-pay | Admitting: Family Medicine

## 2018-05-10 ENCOUNTER — Telehealth: Payer: Self-pay | Admitting: Internal Medicine

## 2018-05-10 NOTE — Telephone Encounter (Signed)
PA has been sent and approved. Pt notified.

## 2018-05-10 NOTE — Telephone Encounter (Signed)
tradjenta needs a pa.

## 2018-05-10 NOTE — Telephone Encounter (Signed)
Patient has called requesting a call back from [facility]. Declined reason.

## 2018-05-17 ENCOUNTER — Telehealth: Payer: Self-pay

## 2018-05-17 ENCOUNTER — Other Ambulatory Visit: Payer: Self-pay

## 2018-05-17 MED ORDER — LINAGLIPTIN 5 MG PO TABS: 5.0000 mg | ORAL_TABLET | Freq: Every day | ORAL | 1 refills | Status: DC

## 2018-05-17 NOTE — Telephone Encounter (Signed)
PA for Tradjenta 5 MG has been approved by insurance effective 05/10/2018 to 05/10/2019.

## 2018-05-21 ENCOUNTER — Other Ambulatory Visit: Payer: Self-pay

## 2018-05-21 MED ORDER — EMPAGLIFLOZIN 25 MG PO TABS: 25.0000 mg | ORAL_TABLET | Freq: Every day | ORAL | 2 refills | Status: DC

## 2018-05-22 ENCOUNTER — Other Ambulatory Visit: Payer: Self-pay

## 2018-05-22 MED ORDER — GLIPIZIDE ER 5 MG PO TB24: ORAL_TABLET | ORAL | 3 refills | Status: DC

## 2018-05-22 NOTE — Telephone Encounter (Signed)
Patient called and asked for the Glipizide XL to be sent to [facility] from now on.  Refill sent.

## 2018-06-07 ENCOUNTER — Encounter: Payer: Self-pay | Admitting: Internal Medicine

## 2018-06-11 MED ORDER — GLUCOSE BLOOD VI STRP: ORAL_STRIP | 12 refills | Status: DC

## 2018-07-01 ENCOUNTER — Encounter: Payer: Self-pay | Admitting: Internal Medicine

## 2018-07-02 MED ORDER — ONETOUCH ULTRASOFT LANCETS MISC: 12 refills | Status: DC

## 2018-07-02 MED ORDER — GLUCOSE BLOOD VI STRP: ORAL_STRIP | 12 refills | Status: DC

## 2018-07-05 MED ORDER — ONETOUCH DELICA LANCETS 33G MISC: 12 refills | Status: DC

## 2018-07-05 NOTE — Addendum Note (Signed)
Addended by: Cardell Peach I on: 07/05/2018 01:28 PM   Modules accepted: Orders

## 2018-07-10 MED ORDER — LOSARTAN POTASSIUM 100 MG PO TABS: 100.0000 mg | ORAL_TABLET | Freq: Every day | ORAL | 4 refills | Status: DC

## 2018-07-10 NOTE — Addendum Note (Signed)
Addended by: Cardell Peach I on: 07/10/2018 08:43 AM   Modules accepted: Orders

## 2018-10-08 ENCOUNTER — Other Ambulatory Visit: Payer: Self-pay

## 2018-10-10 ENCOUNTER — Other Ambulatory Visit: Payer: Self-pay

## 2018-10-10 ENCOUNTER — Ambulatory Visit (INDEPENDENT_AMBULATORY_CARE_PROVIDER_SITE_OTHER): Payer: BC Managed Care – PPO | Admitting: Internal Medicine

## 2018-10-10 ENCOUNTER — Encounter: Payer: Self-pay | Admitting: Internal Medicine

## 2018-10-10 VITALS — BP 148/70 | HR 71 | Ht 68.5 in | Wt 149.0 lb

## 2018-10-10 DIAGNOSIS — E1169 Type 2 diabetes mellitus with other specified complication: Secondary | ICD-10-CM

## 2018-10-10 DIAGNOSIS — E119 Type 2 diabetes mellitus without complications: Secondary | ICD-10-CM | POA: Diagnosis not present

## 2018-10-10 DIAGNOSIS — E785 Hyperlipidemia, unspecified: Secondary | ICD-10-CM | POA: Diagnosis not present

## 2018-10-10 LAB — POCT GLYCOSYLATED HEMOGLOBIN (HGB A1C): Hemoglobin A1C: 6.1 % — AB (ref 4.0–5.6)

## 2018-10-10 NOTE — Patient Instructions (Addendum)
  Please continue: Medicine  Breakfast Lunch Dinner  Metformin M  M  Glipizide   G -5  Glipizide XL G XL -5    Jardiance/Farxiga J/F    Tradjenta  T    Please return in 6 months with your sugar log.

## 2018-10-10 NOTE — Addendum Note (Signed)
Addended by: Cardell Peach I on: 10/10/2018 08:51 AM   Modules accepted: Orders

## 2018-10-10 NOTE — Progress Notes (Signed)
Patient ID: William Nixon, male   DOB: 1960-06-17, 58 y.o.   MRN: [id number]  HPI: William Nixon is a 59 y.o.-year-old male, returning for f/u DM2, dx in ~1998, non--insulin-dependent, well controlled, without long-term complications. Last visit 6 months ago.  Reviewed latest HbA1c levels: Lab Results  Component Value Date   HGBA1C 6.4 (A) 04/12/2018   HGBA1C 6.3 01/16/2018   HGBA1C 6.1 (A) 09/06/2017  During summer months, as he is more active, his HbA1c is usually lower.  Pt is on: Medicine  Breakfast Lunch Dinner  Metformin 1000 M  M  Glipizide 5   G -5  Glipizide XL 5 G XL -5    Jardiance 25/Farxiga 5 J/F    Tradjenta 5  T    He also tried herbal remedies: - mustard seed daily >> helps a lot - bitter melon once a week.  Pt checks his sugars 1-2 times a day: - am: 90-125 >> 92-115, 121 >> 94-127, 130 - 2h after b'fast: n/c  - before lunch: n/c >> 103-165 >> n/c - 2h after lunch: 129-179 >> 160-165, 170 >> 135-179, 181 - before dinner: 92-114 >> n/c - 2h after dinner: 142-179 >> 170-180 >> 162-172 - bedtime: n/c - nighttime: n/c Lowest sugar was 80s >> 94; he has hypoglycemia awareness  in the 80s. Highest sugar was 180 >> 181.  Glucometer: AccuChek (?[employer]iva)  His meals are mostly vegetarian.  He exercises consistently: He runs 4 to 5 miles almost every day and lifting weights twice a week.  -No CKD, last BUN/creatinine:  Lab Results  Component Value Date   BUN 15 01/16/2018   CREATININE 0.88 01/16/2018  On losartan 50.  -No microalbuminuria: Lab Results  Component Value Date   MICRALBCREAT 2.1 01/16/2018   MICRALBCREAT 2.5 01/10/2017   MICRALBCREAT 2.1 01/06/2016   MICRALBCREAT 2.3 09/10/2014   MICRALBCREAT 0.0 04/30/2014   MICRALBCREAT 0.6 10/02/2013   MICRALBCREAT 1.3 05/29/2013   MICRALBCREAT 0.8 11/07/2012   MICRALBCREAT 0.7 11/02/2011   MICRALBCREAT 1.2 04/27/2011   -+ HL; last set of lipids: Lab Results  Component Value Date   CHOL  163 01/16/2018   HDL 27.00 (L) 01/16/2018   LDLCALC 103 (H) 01/16/2018   LDLDIRECT 96.9 11/07/2012   TRIG 165.0 (H) 01/16/2018   CHOLHDL 6 01/16/2018  He refused a statin at last visit. - last eye exam was in 04/2018: No DR (Dr. Deloria Nixon).  - no numbness and tingling in his feet.  He started a new job in 06/2016.  ROS: Constitutional: no weight gain/no weight loss, no fatigue, no subjective hyperthermia, no subjective hypothermia Eyes: no blurry vision, no xerophthalmia ENT: no sore throat, no nodules palpated in neck, no dysphagia, no odynophagia, no hoarseness Cardiovascular: no CP/no SOB/no palpitations/no leg swelling Respiratory: no cough/no SOB/no wheezing Gastrointestinal: no N/no V/no D/no C/no acid reflux Musculoskeletal: no muscle aches/no joint aches Skin: no rashes, no hair loss Neurological: no tremors/no numbness/no tingling/no dizziness  I reviewed pt's medications, allergies, PMH, social hx, family hx, and changes were documented in the history of present illness. Otherwise, unchanged from my initial visit note.  Past Medical History:  Diagnosis Date  . Diabetes mellitus    type II  . Hyperlipidemia    pt. states that hdl ldl was elevated denies hyperlipidemia  . Hypertension   . Hyperthyroidism    pt. denies having any thyroid issues  . Personal history of colonic polyps - adenomas 08/06/2013   07/2013 - 3 diminutive  adenomas - repeat colonoscopy 2018  . Right hydrocele   . S/P ACL repair    right    Past Surgical History:  Procedure Laterality Date  . COLONOSCOPY    . KNEE SURGERY  2002   right  . POLYPECTOMY     History   Social History  . Marital Status: Married    Spouse Name: N/A    Number of Children: 1   Occupational History  . controller   Social History Main Topics  . Smoking status: Never Smoker   . Smokeless tobacco: Never Used  . Alcohol Use:     1 drink(s) per week  . Drug Use: No   Current Outpatient Medications on File  Prior to Visit  Medication Sig Dispense Refill  . empagliflozin (JARDIANCE) 25 MG TABS tablet Take 25 mg by mouth daily. 90 tablet 2  . glipiZIDE (GLUCOTROL XL) 5 MG 24 hr tablet Take 1 tablet daily with dinner 90 tablet 3  . glipiZIDE (GLUCOTROL) 5 MG tablet Take 1 tablet (5 mg total) by mouth daily with breakfast. 90 tablet 3  . glucose blood (ONETOUCH VERIO) test strip Use as instructed to check blood sugar 3 times a day. 300 each 12  . linagliptin (TRADJENTA) 5 MG TABS tablet Take 1 tablet (5 mg total) by mouth daily. 90 tablet 1  . losartan (COZAAR) 100 MG tablet Take 1 tablet (100 mg total) by mouth daily. 90 tablet 4  . metFORMIN (GLUCOPHAGE) 1000 MG tablet TAKE 1 TABLET BY MOUTH TWICE A DAY 180 tablet 3  . Multiple Vitamin (MULTIVITAMIN WITH MINERALS) TABS Take 1 tablet by mouth daily. Centrum    . OneTouch Delica Lancets 91Y MISC Use to check blood sugar 3 times a day. 300 each 12  . sildenafil (REVATIO) 20 MG tablet Take 1 tablet (20 mg total) by mouth at bedtime as needed. 30 tablet 11   Current Facility-Administered Medications on File Prior to Visit  Medication Dose Route Frequency Provider Last Rate Last Dose  . 0.9 %  sodium chloride infusion  500 mL Intravenous Continuous Gatha Mayer, MD      . 0.9 %  sodium chloride infusion  500 mL Intravenous Continuous Gatha Mayer, MD       No Known Allergies Family History  Problem Relation Age of Onset  . Alcohol abuse Other   . Diabetes Other   . Hypertension Other   . Colon cancer Neg Hx   . Rectal cancer Neg Hx   . Stomach cancer Neg Hx   . Esophageal cancer Neg Hx   . Pancreatic cancer Neg Hx   . Colon polyps Neg Hx    PE: BP (!) 148/70   Pulse 71   Ht 5' 8.5" (1.74 m)   Wt 149 lb (67.6 kg)   SpO2 98%   BMI 22.33 kg/m  Body mass index is 22.33 kg/m. Wt Readings from Last 3 Encounters:  10/10/18 149 lb (67.6 kg)  04/12/18 150 lb (68 kg)  01/16/18 151 lb 8 oz (68.7 kg)   Constitutional: Normal weight, in  NAD Eyes: PERRLA, EOMI, no exophthalmos ENT: moist mucous membranes, no thyromegaly, no cervical lymphadenopathy Cardiovascular: RRR, No MRG Respiratory: CTA B Gastrointestinal: abdomen soft, NT, ND, BS+ Musculoskeletal: no deformities, strength intact in all 4 Skin: moist, warm, no rashes Neurological: no tremor with outstretched hands, DTR normal in all 4  ASSESSMENT: 1. DM2, non-insulin-dependent, controlled, without complications - also FH of mild DM on mother's  side  2. HL  PLAN:  1. Patient with longstanding, well-controlled, type 2 diabetes, on oral antidiabetic regimen.  He is very conscientious, exercising regularly and eating a mostly vegetarian diet.  We discussed in the past about cutting out eggs, which he did.  His HbA1c is usually slightly higher during the winter.  At last visit, sugars were unchanged from before except for slightly higher CBGs after dinner.  We discussed about trying to adjust dinner, but we did not change his regimen at that time.  HbA1c then was 6.4%, slightly higher than before. -He was getting [place] and [place] from [place] >> much cheaper.  We changed to Jardiance at the beginning of the year since this was covered. He still has [place] at home >> will start Jardiance when he finishes this. - at this viit, sugars are similar to before >> no changes needed - I suggested to:  Patient Instructions   Please continue: Medicine  Breakfast Lunch Dinner  Metformin M  M  Glipizide   G -5  Glipizide XL G XL -5    Jardiance/Farxiga J/F    Tradjenta  T    Please return in 6 months with your sugar log.   - today, HbA1c is 6.1% (better!) - continue checking sugars at different times of the day - check 1x a day, rotating checks - advised for yearly eye exams >> he is UTD - Return to clinic in 6 mo with sugar log     2. HL - Reviewed latest lipid panel from 01/2018: LDL slightly above goal; triglycerides also slightly above goal; HDL low Lab Results   Component Value Date   CHOL 163 01/16/2018   HDL 27.00 (L) 01/16/2018   LDLCALC 103 (H) 01/16/2018   LDLDIRECT 96.9 11/07/2012   TRIG 165.0 (H) 01/16/2018   CHOLHDL 6 01/16/2018  -At last visit I suggested a statin but he declined.  Philemon Kingdom, MD PhD [facility] End[facility]

## 2018-11-01 DIAGNOSIS — I10 Essential (primary) hypertension: Secondary | ICD-10-CM | POA: Diagnosis not present

## 2018-11-01 DIAGNOSIS — E785 Hyperlipidemia, unspecified: Secondary | ICD-10-CM | POA: Diagnosis not present

## 2018-11-01 DIAGNOSIS — E039 Hypothyroidism, unspecified: Secondary | ICD-10-CM | POA: Diagnosis not present

## 2018-11-01 DIAGNOSIS — Z1331 Encounter for screening for depression: Secondary | ICD-10-CM | POA: Diagnosis not present

## 2018-11-01 DIAGNOSIS — E119 Type 2 diabetes mellitus without complications: Secondary | ICD-10-CM | POA: Diagnosis not present

## 2019-01-16 DIAGNOSIS — E7849 Other hyperlipidemia: Secondary | ICD-10-CM | POA: Diagnosis not present

## 2019-01-16 DIAGNOSIS — E119 Type 2 diabetes mellitus without complications: Secondary | ICD-10-CM | POA: Diagnosis not present

## 2019-01-16 DIAGNOSIS — Z0000 Encounter for general adult medical examination without abnormal findings: Secondary | ICD-10-CM | POA: Diagnosis not present

## 2019-01-16 DIAGNOSIS — R82998 Other abnormal findings in urine: Secondary | ICD-10-CM | POA: Diagnosis not present

## 2019-01-16 DIAGNOSIS — Z125 Encounter for screening for malignant neoplasm of prostate: Secondary | ICD-10-CM | POA: Diagnosis not present

## 2019-01-16 DIAGNOSIS — E038 Other specified hypothyroidism: Secondary | ICD-10-CM | POA: Diagnosis not present

## 2019-01-23 DIAGNOSIS — E039 Hypothyroidism, unspecified: Secondary | ICD-10-CM | POA: Diagnosis not present

## 2019-01-23 DIAGNOSIS — Z23 Encounter for immunization: Secondary | ICD-10-CM | POA: Diagnosis not present

## 2019-01-23 DIAGNOSIS — Z0000 Encounter for general adult medical examination without abnormal findings: Secondary | ICD-10-CM | POA: Diagnosis not present

## 2019-01-23 DIAGNOSIS — I10 Essential (primary) hypertension: Secondary | ICD-10-CM | POA: Diagnosis not present

## 2019-01-23 DIAGNOSIS — E119 Type 2 diabetes mellitus without complications: Secondary | ICD-10-CM | POA: Diagnosis not present

## 2019-01-23 DIAGNOSIS — I499 Cardiac arrhythmia, unspecified: Secondary | ICD-10-CM | POA: Diagnosis not present

## 2019-02-14 ENCOUNTER — Other Ambulatory Visit: Payer: Self-pay | Admitting: Internal Medicine

## 2019-03-01 DIAGNOSIS — R55 Syncope and collapse: Secondary | ICD-10-CM | POA: Diagnosis not present

## 2019-03-01 DIAGNOSIS — I499 Cardiac arrhythmia, unspecified: Secondary | ICD-10-CM | POA: Diagnosis not present

## 2019-03-01 DIAGNOSIS — I10 Essential (primary) hypertension: Secondary | ICD-10-CM | POA: Diagnosis not present

## 2019-03-01 DIAGNOSIS — E039 Hypothyroidism, unspecified: Secondary | ICD-10-CM | POA: Diagnosis not present

## 2019-03-04 DIAGNOSIS — E038 Other specified hypothyroidism: Secondary | ICD-10-CM | POA: Diagnosis not present

## 2019-03-07 ENCOUNTER — Other Ambulatory Visit: Payer: Self-pay

## 2019-03-07 ENCOUNTER — Ambulatory Visit: Payer: BC Managed Care – PPO

## 2019-03-07 ENCOUNTER — Ambulatory Visit (INDEPENDENT_AMBULATORY_CARE_PROVIDER_SITE_OTHER): Payer: BC Managed Care – PPO | Admitting: Cardiology

## 2019-03-07 ENCOUNTER — Encounter: Payer: Self-pay | Admitting: Cardiology

## 2019-03-07 VITALS — BP 170/84 | HR 66 | Temp 97.2°F | Ht 68.0 in | Wt 151.0 lb

## 2019-03-07 DIAGNOSIS — R9431 Abnormal electrocardiogram [ECG] [EKG]: Secondary | ICD-10-CM

## 2019-03-07 DIAGNOSIS — I493 Ventricular premature depolarization: Secondary | ICD-10-CM

## 2019-03-07 DIAGNOSIS — I10 Essential (primary) hypertension: Secondary | ICD-10-CM

## 2019-03-07 DIAGNOSIS — R55 Syncope and collapse: Secondary | ICD-10-CM

## 2019-03-07 DIAGNOSIS — I340 Nonrheumatic mitral (valve) insufficiency: Secondary | ICD-10-CM | POA: Diagnosis not present

## 2019-03-07 NOTE — Progress Notes (Signed)
Primary Physician:  Prince Solian, MD   Patient ID: William Nixon, male    DOB: 01/18/1961, 58 y.o.   MRN: [id number]  Subjective:    Chief Complaint  Patient presents with  . Loss of Consciousness  . New Patient (Initial Visit)    HPI: William Nixon  is a 58 y.o. male  with type 2 diabetes, hypertension, hyperthyroidism, referred to [place] for evaluation of syncope.   Patient reports 1 week ago, after sitting on the couch for 1 hour, he suddenly got up to walk to his kitchen, and suddenly felt dizzy. He tried to hold onto the counter, but did fall down. Suspects that he LOC for 1-2 minutes. When he came to, his vision was blurry, but after a few minutes resolved. No associated chest pain, dyspnea, or heart racing. He did not check his glucose at that time.  He states he had an episode similar to this approximately 2 years ago.  He states that his blood pressure is well controlled.  Diabetes is also well controlled with present medications.  He has just recently started Crestor for hyperlipidemia.  No family history of heart disease.  No former tobacco use.  Only occasional alcohol use.  He does run 4-5 miles per day that he tolerates well. He has resumed his normal activities since his episode.   Past Medical History:  Diagnosis Date  . Diabetes mellitus    type II  . Hyperlipidemia    pt. states that hdl ldl was elevated denies hyperlipidemia  . Hypertension   . Hyperthyroidism    pt. denies having any thyroid issues  . Personal history of colonic polyps - adenomas 08/06/2013   07/2013 - 3 diminutive adenomas - repeat colonoscopy 2018  . Right hydrocele   . S/P ACL repair    right     Past Surgical History:  Procedure Laterality Date  . COLONOSCOPY    . KNEE SURGERY  2002   right  . POLYPECTOMY      Social History   Socioeconomic History  . Marital status: Married    Spouse name: Not on file  . Number of children: 1  . Years of education: Not on file   . Highest education level: Not on file  Occupational History  . Not on file  Social Needs  . Financial resource strain: Not on file  . Food insecurity    Worry: Not on file    Inability: Not on file  . Transportation needs    Medical: Not on file    Non-medical: Not on file  Tobacco Use  . Smoking status: Never Smoker  . Smokeless tobacco: Never Used  Substance and Sexual Activity  . Alcohol use: Yes    Alcohol/week: 2.0 standard drinks    Types: 2 Standard drinks or equivalent per week    Comment: occasionally  . Drug use: No  . Sexual activity: Not on file  Lifestyle  . Physical activity    Days per week: Not on file    Minutes per session: Not on file  . Stress: Not on file  Relationships  . Social Herbalist on phone: Not on file    Gets together: Not on file    Attends religious service: Not on file    Active member of club or organization: Not on file    Attends meetings of clubs or organizations: Not on file    Relationship status: Not on file  .  Intimate partner violence    Fear of current or ex partner: Not on file    Emotionally abused: Not on file    Physically abused: Not on file    Forced sexual activity: Not on file  Other Topics Concern  . Not on file  Social History Narrative  . Not on file    Review of Systems  Constitution: Negative for decreased appetite, malaise/fatigue, weight gain and weight loss.  Eyes: Negative for visual disturbance.  Cardiovascular: Positive for syncope. Negative for chest pain, claudication, dyspnea on exertion, leg swelling, orthopnea and palpitations.  Respiratory: Negative for hemoptysis and wheezing.   Endocrine: Negative for cold intolerance and heat intolerance.  Hematologic/Lymphatic: Does not bruise/bleed easily.  Skin: Negative for nail changes.  Musculoskeletal: Negative for muscle weakness and myalgias.  Gastrointestinal: Negative for abdominal pain, change in bowel habit, nausea and vomiting.   Neurological: Negative for difficulty with concentration, dizziness, focal weakness and headaches.  Psychiatric/Behavioral: Negative for altered mental status and suicidal ideas.  All other systems reviewed and are negative.     Objective:  Blood pressure (!) 170/84, pulse 66, temperature (!) 97.2 F (36.2 C), height '5\' 8"'  (1.727 m), weight 151 lb (68.5 kg), SpO2 100 %. Body mass index is 22.96 kg/m.    Physical Exam  Constitutional: He is oriented to person, place, and time. Vital signs are normal. He appears well-developed and well-nourished.  HENT:  Head: Normocephalic and atraumatic.  Neck: Normal range of motion.  Cardiovascular: Normal rate, regular rhythm and intact distal pulses. Frequent extrasystoles are present.  Murmur heard. High-pitched blowing holosystolic murmur is present with a grade of 2/6 at the apex. Pulmonary/Chest: Effort normal and breath sounds normal. No accessory muscle usage. No respiratory distress.  Abdominal: Soft. Bowel sounds are normal.  Musculoskeletal: Normal range of motion.  Neurological: He is alert and oriented to person, place, and time.  Skin: Skin is warm and dry.  Vitals reviewed.  Radiology: No results found.  Laboratory examination:   01/16/2019: Cholesterol 185, triglycerides 185, HDL 25, LDL 123.  Creatinine 0.8, EGFR 99/120, potassium 4.6, CMP normal.  CBC normal.  TSH elevated at 5.37.  Hemoglobin A1c 6.1%.  CMP Latest Ref Rng & Units 01/16/2018 01/10/2017 01/06/2016  Glucose 70 - 99 mg/dL 134(H) 121(H) 96  BUN 6 - 23 mg/dL '15 10 11  ' Creatinine 0.40 - 1.50 mg/dL 0.88 0.78 0.77  Sodium 135 - 145 mEq/L 139 138 138  Potassium 3.5 - 5.1 mEq/L 4.4 4.1 3.7  Chloride 96 - 112 mEq/L 102 100 101  CO2 19 - 32 mEq/L '29 31 28  ' Calcium 8.4 - 10.5 mg/dL 9.3 9.5 8.8  Total Protein 6.0 - 8.3 g/dL 7.3 7.2 6.8  Total Bilirubin 0.2 - 1.2 mg/dL 0.7 0.9 0.9  Alkaline Phos 39 - 117 U/L 68 65 64  AST 0 - 37 U/L '25 23 22  ' ALT 0 - 53 U/L '28 24 21    ' CBC Latest Ref Rng & Units 01/16/2018 01/10/2017 01/06/2016  WBC 4.0 - 10.5 K/uL 3.6(L) 5.8 5.1  Hemoglobin 13.0 - 17.0 g/dL 15.5 15.6 14.5  Hematocrit 39.0 - 52.0 % 44.2 45.9 41.6  Platelets 150.0 - 400.0 K/uL 136.0(L) 148.0(L) 146.0(L)   Lipid Panel     Component Value Date/Time   CHOL 163 01/16/2018 1008   TRIG 165.0 (H) 01/16/2018 1008   HDL 27.00 (L) 01/16/2018 1008   CHOLHDL 6 01/16/2018 1008   VLDL 33.0 01/16/2018 1008   LDLCALC 103 (  H) 01/16/2018 1008   LDLDIRECT 96.9 11/07/2012 0806   HEMOGLOBIN A1C Lab Results  Component Value Date   HGBA1C 6.1 (A) 10/10/2018   TSH No results for input(s): TSH in the last 8760 hours.  PRN Meds:. There are no discontinued medications. Current Meds  Medication Sig  . empagliflozin (JARDIANCE) 25 MG TABS tablet Take 25 mg by mouth daily.  Marland Kitchen glipiZIDE (GLUCOTROL XL) 5 MG 24 hr tablet Take 1 tablet daily with dinner  . glipiZIDE (GLUCOTROL) 5 MG tablet Take 1 tablet (5 mg total) by mouth daily with breakfast.  . glucose blood (ONETOUCH VERIO) test strip Use as instructed to check blood sugar 3 times a day.  . linagliptin (TRADJENTA) 5 MG TABS tablet Take 1 tablet (5 mg total) by mouth daily.  Marland Kitchen losartan (COZAAR) 100 MG tablet Take 1 tablet (100 mg total) by mouth daily.  . metFORMIN (GLUCOPHAGE) 1000 MG tablet TAKE 1 TABLET BY MOUTH TWICE A DAY  . Multiple Vitamin (MULTIVITAMIN WITH MINERALS) TABS Take 1 tablet by mouth daily. Centrum  . OneTouch Delica Lancets 17C MISC Use to check blood sugar 3 times a day.  . rosuvastatin (CRESTOR) 10 MG tablet Take 10 mg by mouth at bedtime.  . sildenafil (REVATIO) 20 MG tablet Take 1 tablet (20 mg total) by mouth at bedtime as needed.   Current Facility-Administered Medications for the 03/07/19 encounter (Office Visit) with Miquel Dunn, NP  Medication  . 0.9 %  sodium chloride infusion  . 0.9 %  sodium chloride infusion    Cardiac Studies:     Assessment:   Syncope and collapse -  Plan: PCV MYOCARDIAL PERFUSION WO LEXISCAN  Primary hypertension - Plan: EKG 12-Lead  Mitral late systolic murmur - Plan: PCV ECHOCARDIOGRAM COMPLETE  Abnormal EKG - Plan: PCV MYOCARDIAL PERFUSION WO LEXISCAN  PVC (premature ventricular contraction) - Plan: HOLTER MONITOR - 24 HOUR  EKG 03/07/2019: Normal sinus rhythm at 66 bpm, normal axis, IRBBB. Nonspecific T wave abnormality.   Recommendations:   Episode suggestive of vasovagal syncope. I have encouraged him to use counterpressure maneuvers to help with these episodes.  He is noted to have frequent ectopy on exam, I will place him on 24-hour Holter monitor to evaluate his PVC burden.  Do not suspect arrhythmic etiology for his episode of syncope.  Although he has not had any chest pain or dyspnea, he does have risk factors for CAD, and given his recent episode of syncope, this should be excluded.  We will obtain exercise nuclear stress testing.  We will also obtain echocardiogram for further evaluation.  Has systolic mitral murmur on exam.   His blood pressure was elevated in office today, but is generally very well controlled.  Continue with his present medications.  We will reevaluate at his next office visit.  I will see him back after the test for further recommendations and reevaluation.   *I have discussed this case with Dr. Einar Gip and he personally examined the patient and participated in formulating the plan.*   Miquel Dunn, MSN, APRN, FNP-C [facility]. [place] Office: [phone] Fax: [phone]

## 2019-03-08 ENCOUNTER — Other Ambulatory Visit: Payer: Self-pay

## 2019-03-08 DIAGNOSIS — I493 Ventricular premature depolarization: Secondary | ICD-10-CM | POA: Diagnosis not present

## 2019-03-08 MED ORDER — APIXABAN 5 MG PO TABS: 5.0000 mg | ORAL_TABLET | Freq: Two times a day (BID) | ORAL | 1 refills | Status: DC

## 2019-03-08 NOTE — Telephone Encounter (Signed)
Pt was informed.

## 2019-03-08 NOTE — Telephone Encounter (Signed)
The Holter company called to inform us that they saw that pt is always A-Fib.

## 2019-03-08 NOTE — Telephone Encounter (Signed)
I discussed with the patient that he was noted to have 4 hours of A fib on holter monitor. Further information is not available at this time. He was asymptomatic. Will continue with workup as stress testing and echocardiogram for further evaluation. His chadsvasc score is 2 (DM and HTN), will need anticoagulation. Kidney function is stable. Will send in Eliquis 5 mg BID. This was discussed with the patient. His last CBC was within normal limits; however, previously his platelets have been borderline low. No history of major bleeding. He questions if he should be exercising, for precautionary measures, I have advised that he proceed with just daily walking until he can be further evaluated. Will monitor and will further discuss at his next appt.

## 2019-03-20 ENCOUNTER — Other Ambulatory Visit: Payer: Self-pay

## 2019-03-20 ENCOUNTER — Ambulatory Visit (INDEPENDENT_AMBULATORY_CARE_PROVIDER_SITE_OTHER): Payer: BC Managed Care – PPO

## 2019-03-20 ENCOUNTER — Other Ambulatory Visit: Payer: BC Managed Care – PPO

## 2019-03-20 DIAGNOSIS — R55 Syncope and collapse: Secondary | ICD-10-CM | POA: Diagnosis not present

## 2019-03-20 DIAGNOSIS — R9431 Abnormal electrocardiogram [ECG] [EKG]: Secondary | ICD-10-CM | POA: Diagnosis not present

## 2019-03-20 DIAGNOSIS — I340 Nonrheumatic mitral (valve) insufficiency: Secondary | ICD-10-CM | POA: Diagnosis not present

## 2019-03-22 DIAGNOSIS — I10 Essential (primary) hypertension: Secondary | ICD-10-CM | POA: Diagnosis not present

## 2019-03-22 DIAGNOSIS — E119 Type 2 diabetes mellitus without complications: Secondary | ICD-10-CM | POA: Diagnosis not present

## 2019-03-25 DIAGNOSIS — I480 Paroxysmal atrial fibrillation: Secondary | ICD-10-CM | POA: Diagnosis not present

## 2019-03-25 DIAGNOSIS — R55 Syncope and collapse: Secondary | ICD-10-CM | POA: Diagnosis not present

## 2019-03-25 DIAGNOSIS — Z1331 Encounter for screening for depression: Secondary | ICD-10-CM | POA: Diagnosis not present

## 2019-03-25 DIAGNOSIS — I10 Essential (primary) hypertension: Secondary | ICD-10-CM | POA: Diagnosis not present

## 2019-03-25 DIAGNOSIS — E785 Hyperlipidemia, unspecified: Secondary | ICD-10-CM | POA: Diagnosis not present

## 2019-03-25 DIAGNOSIS — E119 Type 2 diabetes mellitus without complications: Secondary | ICD-10-CM | POA: Diagnosis not present

## 2019-03-25 DIAGNOSIS — Z23 Encounter for immunization: Secondary | ICD-10-CM | POA: Diagnosis not present

## 2019-03-26 ENCOUNTER — Other Ambulatory Visit: Payer: Self-pay

## 2019-03-26 ENCOUNTER — Ambulatory Visit (INDEPENDENT_AMBULATORY_CARE_PROVIDER_SITE_OTHER): Payer: BC Managed Care – PPO | Admitting: Cardiology

## 2019-03-26 ENCOUNTER — Encounter: Payer: Self-pay | Admitting: Cardiology

## 2019-03-26 VITALS — BP 147/83 | HR 66 | Ht 68.0 in | Wt 150.8 lb

## 2019-03-26 DIAGNOSIS — I493 Ventricular premature depolarization: Secondary | ICD-10-CM | POA: Diagnosis not present

## 2019-03-26 DIAGNOSIS — R0683 Snoring: Secondary | ICD-10-CM

## 2019-03-26 DIAGNOSIS — I10 Essential (primary) hypertension: Secondary | ICD-10-CM

## 2019-03-26 DIAGNOSIS — R9431 Abnormal electrocardiogram [ECG] [EKG]: Secondary | ICD-10-CM

## 2019-03-26 DIAGNOSIS — I480 Paroxysmal atrial fibrillation: Secondary | ICD-10-CM | POA: Diagnosis not present

## 2019-03-26 MED ORDER — DILTIAZEM HCL ER COATED BEADS 180 MG PO CP24: 180.0000 mg | ORAL_CAPSULE | Freq: Every day | ORAL | 2 refills | Status: DC

## 2019-03-26 NOTE — Progress Notes (Signed)
Primary Physician:  Prince Solian, MD   Patient ID: William Nixon, male    DOB: 1960/06/10, 58 y.o.   MRN: [id number]  Subjective:    Chief Complaint  Patient presents with  . Hypertension  . Loss of Consciousness    HPI: William Nixon  is a 58 y.o. male  with type 2 diabetes, hypertension, hyperthyroidism, initially referred to [place] for evaluation of syncope.  Had what appears to be vasovagal syncope about a month ago. He had an episode similar to this approximately 2 years ago. He does run 4-5 miles per day that he tolerates well. He has resumed his normal activities since his episode. He has had occasional episodes of palpitations lasting a few seconds and also s/o snoring, wife states that he wakes up snoring and having difficulty in breathing.  He underwent Holter, echo for murmur an stress testing and presents for f/u.  No recurrence of syncope. Still has occasional palpitations. No chest pian or dyspnea. Wife on speaker phone.   Past Medical History:  Diagnosis Date  . Diabetes mellitus    type II  . Hyperlipidemia    pt. states that hdl ldl was elevated denies hyperlipidemia  . Hypertension   . Hyperthyroidism    pt. denies having any thyroid issues  . Personal history of colonic polyps - adenomas 08/06/2013   07/2013 - 3 diminutive adenomas - repeat colonoscopy 2018  . Right hydrocele   . S/P ACL repair    right     Past Surgical History:  Procedure Laterality Date  . COLONOSCOPY    . KNEE SURGERY  2002   right  . POLYPECTOMY      Social History   Socioeconomic History  . Marital status: Married    Spouse name: Not on file  . Number of children: 1  . Years of education: Not on file  . Highest education level: Not on file  Occupational History  . Not on file  Social Needs  . Financial resource strain: Not on file  . Food insecurity    Worry: Not on file    Inability: Not on file  . Transportation needs    Medical: Not on file   Non-medical: Not on file  Tobacco Use  . Smoking status: Never Smoker  . Smokeless tobacco: Never Used  Substance and Sexual Activity  . Alcohol use: Yes    Alcohol/week: 2.0 standard drinks    Types: 2 Standard drinks or equivalent per week    Comment: occasionally  . Drug use: No  . Sexual activity: Not on file  Lifestyle  . Physical activity    Days per week: Not on file    Minutes per session: Not on file  . Stress: Not on file  Relationships  . Social Herbalist on phone: Not on file    Gets together: Not on file    Attends religious service: Not on file    Active member of club or organization: Not on file    Attends meetings of clubs or organizations: Not on file    Relationship status: Not on file  . Intimate partner violence    Fear of current or ex partner: Not on file    Emotionally abused: Not on file    Physically abused: Not on file    Forced sexual activity: Not on file  Other Topics Concern  . Not on file  Social History Narrative  . Not on file  Review of Systems  Constitution: Negative for decreased appetite, malaise/fatigue, weight gain and weight loss.  Eyes: Negative for visual disturbance.  Cardiovascular: Positive for palpitations. Negative for chest pain, claudication, dyspnea on exertion, leg swelling, orthopnea and syncope.  Respiratory: Positive for snoring. Negative for hemoptysis and wheezing.   Endocrine: Negative for cold intolerance and heat intolerance.  Hematologic/Lymphatic: Does not bruise/bleed easily.  Skin: Negative for nail changes.  Musculoskeletal: Negative for muscle weakness and myalgias.  Gastrointestinal: Negative for abdominal pain, change in bowel habit, nausea and vomiting.  Neurological: Negative for difficulty with concentration, dizziness, focal weakness and headaches.  Psychiatric/Behavioral: Negative for altered mental status and suicidal ideas.  All other systems reviewed and are negative.      Objective:  Blood pressure (!) 147/83, pulse 66, height '5\' 8"'  (1.727 m), weight 150 lb 12.8 oz (68.4 kg), SpO2 99 %. Body mass index is 22.93 kg/m.    Physical Exam  Constitutional: He is oriented to person, place, and time. Vital signs are normal. He appears well-developed and well-nourished.  HENT:  Head: Normocephalic and atraumatic.  Neck: Normal range of motion.  Cardiovascular: Normal rate, regular rhythm and intact distal pulses. Frequent extrasystoles are present.  Murmur heard. High-pitched blowing mid to late systolic murmur is present with a grade of 2/6 at the apex. Pulmonary/Chest: Effort normal and breath sounds normal. No accessory muscle usage. No respiratory distress.  Abdominal: Soft. Bowel sounds are normal.  Musculoskeletal: Normal range of motion.  Neurological: He is alert and oriented to person, place, and time.  Skin: Skin is warm and dry.  Vitals reviewed.  Radiology: No results found.  Laboratory examination:   01/16/2019: Cholesterol 185, triglycerides 185, HDL 25, LDL 123.  Creatinine 0.8, EGFR 99/120, potassium 4.6, CMP normal.  CBC normal.  TSH elevated at 5.37.  Hemoglobin A1c 6.1%.  CMP Latest Ref Rng & Units 01/16/2018 01/10/2017 01/06/2016  Glucose 70 - 99 mg/dL 134(H) 121(H) 96  BUN 6 - 23 mg/dL '15 10 11  ' Creatinine 0.40 - 1.50 mg/dL 0.88 0.78 0.77  Sodium 135 - 145 mEq/L 139 138 138  Potassium 3.5 - 5.1 mEq/L 4.4 4.1 3.7  Chloride 96 - 112 mEq/L 102 100 101  CO2 19 - 32 mEq/L '29 31 28  ' Calcium 8.4 - 10.5 mg/dL 9.3 9.5 8.8  Total Protein 6.0 - 8.3 g/dL 7.3 7.2 6.8  Total Bilirubin 0.2 - 1.2 mg/dL 0.7 0.9 0.9  Alkaline Phos 39 - 117 U/L 68 65 64  AST 0 - 37 U/L '25 23 22  ' ALT 0 - 53 U/L '28 24 21   ' CBC Latest Ref Rng & Units 01/16/2018 01/10/2017 01/06/2016  WBC 4.0 - 10.5 K/uL 3.6(L) 5.8 5.1  Hemoglobin 13.0 - 17.0 g/dL 15.5 15.6 14.5  Hematocrit 39.0 - 52.0 % 44.2 45.9 41.6  Platelets 150.0 - 400.0 K/uL 136.0(L) 148.0(L) 146.0(L)   Lipid  Panel     Component Value Date/Time   CHOL 163 01/16/2018 1008   TRIG 165.0 (H) 01/16/2018 1008   HDL 27.00 (L) 01/16/2018 1008   CHOLHDL 6 01/16/2018 1008   VLDL 33.0 01/16/2018 1008   LDLCALC 103 (H) 01/16/2018 1008   LDLDIRECT 96.9 11/07/2012 0806   HEMOGLOBIN A1C Lab Results  Component Value Date   HGBA1C 6.1 (A) 10/10/2018   TSH No results for input(s): TSH in the last 8760 hours.  PRN Meds:. There are no discontinued medications. Current Meds  Medication Sig  . apixaban (ELIQUIS) 5 MG TABS tablet  Take 1 tablet (5 mg total) by mouth 2 (two) times daily.  . empagliflozin (JARDIANCE) 25 MG TABS tablet Take 25 mg by mouth daily.  Marland Kitchen glipiZIDE (GLUCOTROL XL) 5 MG 24 hr tablet Take 1 tablet daily with dinner  . glipiZIDE (GLUCOTROL) 5 MG tablet Take 1 tablet (5 mg total) by mouth daily with breakfast.  . glucose blood (ONETOUCH VERIO) test strip Use as instructed to check blood sugar 3 times a day.  . linagliptin (TRADJENTA) 5 MG TABS tablet Take 1 tablet (5 mg total) by mouth daily.  Marland Kitchen losartan (COZAAR) 100 MG tablet Take 1 tablet (100 mg total) by mouth daily.  . metFORMIN (GLUCOPHAGE) 1000 MG tablet TAKE 1 TABLET BY MOUTH TWICE A DAY  . Multiple Vitamin (MULTIVITAMIN WITH MINERALS) TABS Take 1 tablet by mouth daily. Centrum  . OneTouch Delica Lancets 30Q MISC Use to check blood sugar 3 times a day.  . rosuvastatin (CRESTOR) 10 MG tablet Take 10 mg by mouth at bedtime.  . sildenafil (REVATIO) 20 MG tablet Take 1 tablet (20 mg total) by mouth at bedtime as needed.   Current Facility-Administered Medications for the 03/26/19 encounter (Office Visit) with Adrian Prows, MD  Medication  . 0.9 %  sodium chloride infusion  . 0.9 %  sodium chloride infusion    Cardiac Studies:   Lexiscan Sestamibi stress test 03/20/2019: No previous exam available for comparison. Lexiscan nuclear stress test performed using 1-day protocol. Myocardial perfusion imaging is normal. Left ventricular  ejection fraction is  60% with normal wall motion. Low risk study.  Echocardiogram 122/05/2018: Normal LV systolic function with EF 61%. Left ventricle cavity is normal in size. Moderate concentric hypertrophy of the left ventricle. Normal global wall motion. Normal diastolic function. No definite mitral valve prolapse. Mild (Grade I) mitral regurgitation. Mild tricuspid regurgitation. Estimated pulmonary artery systolic pressure is 65-78 mmHg, with estimated RA pressure 10-15 mmHg.   Holter Monitor for 24 hours  03/07/2019:  Paroxysmal atrial fibrillation with rapid ventricular response, episode lasted at least for 4 hours during the daytime lasting from 1:17 PM through 6:27 PM, occasional PVCs.  No symptoms reported.  Total ventricular ectopics 230 beats.  Forbidden couplets.  Maximum A. fib with RVR rate was 144 bpm.  Assessment:     ICD-10-CM   1. Paroxysmal atrial fibrillation (HCC)  I48.0 diltiazem (CARDIZEM CD) 180 MG 24 hr capsule    Ambulatory referral to Sleep Studies   CHA2DS2-VASc Score is 2.  Yearly risk of stroke: 2.3%.    2. Primary hypertension  I10 diltiazem (CARDIZEM CD) 180 MG 24 hr capsule  3. Abnormal EKG  R94.31   4. PVC (premature ventricular contraction)  I49.3   5. Snoring  R06.83 Ambulatory referral to Sleep Studies    EKG 03/07/2019: Normal sinus rhythm at 66 bpm, normal axis, IRBBB. Nonspecific T wave abnormality.   Recommendations:   William Nixon  is a 58 y.o. male  with type 2 diabetes, hypertension, hyperthyroidism, initially referred to [place] for evaluation of syncope.  Had what appears to be vasovagal syncope about a month ago. He had an episode similar to this approximately 2 years ago.  I reviewed the results of the echocardiogram which reveals mild mitral valve prolapse and mitral mitral regurgitation, but also reviewed moderate LVH.  Although his blood pressure was elevated today, patient states that blood pressure is usually very normal at home.   However this gives [place] an opportunity to add diltiazem for frequent episodes of  paroxysmal atrial fibrillation with RVR.  Cardiac to see him back in 2 months, will consider repeating an event monitor for 7 days to 2 weeks to reevaluate frequency of atrial fibrillation and heart rate response.  He is completely asymptomatic with regard to atrial fibrillation.  Occasional episodes of palpitations related to PVCs.  I have discussed the risks and benefits of anticoagulation with the patient and his wife.  He is at low risk for bleeding complications, would benefit from being on anticoagulation  Patient and his wife both report snoring and episodes of what appears to be apnea.  In view of arrhythmias will refer for sleep evaluation by Dr. Roddie Mc.  I will see him back in 2 months for follow-up.  CC Dr. Roddie Mc, PCP Dr. Berneta Sages   Adrian Prows, MD, [facility] 03/26/2019, 10:17 PM [facility]. [place] Pager: [phone] Office: [phone] If no answer Cell [phone]

## 2019-04-01 ENCOUNTER — Other Ambulatory Visit: Payer: BC Managed Care – PPO

## 2019-04-01 ENCOUNTER — Ambulatory Visit: Payer: BC Managed Care – PPO | Admitting: Cardiology

## 2019-04-02 ENCOUNTER — Other Ambulatory Visit: Payer: Self-pay

## 2019-04-02 ENCOUNTER — Ambulatory Visit (INDEPENDENT_AMBULATORY_CARE_PROVIDER_SITE_OTHER): Payer: BC Managed Care – PPO | Admitting: Neurology

## 2019-04-02 ENCOUNTER — Encounter: Payer: Self-pay | Admitting: Neurology

## 2019-04-02 VITALS — BP 161/72 | HR 59 | Temp 97.5°F | Ht 68.0 in | Wt 153.0 lb

## 2019-04-02 DIAGNOSIS — E1159 Type 2 diabetes mellitus with other circulatory complications: Secondary | ICD-10-CM

## 2019-04-02 DIAGNOSIS — I480 Paroxysmal atrial fibrillation: Secondary | ICD-10-CM | POA: Diagnosis not present

## 2019-04-02 DIAGNOSIS — E1169 Type 2 diabetes mellitus with other specified complication: Secondary | ICD-10-CM | POA: Diagnosis not present

## 2019-04-02 DIAGNOSIS — R0683 Snoring: Secondary | ICD-10-CM

## 2019-04-02 DIAGNOSIS — R55 Syncope and collapse: Secondary | ICD-10-CM

## 2019-04-02 DIAGNOSIS — E785 Hyperlipidemia, unspecified: Secondary | ICD-10-CM

## 2019-04-02 DIAGNOSIS — E119 Type 2 diabetes mellitus without complications: Secondary | ICD-10-CM | POA: Diagnosis not present

## 2019-04-02 DIAGNOSIS — I152 Hypertension secondary to endocrine disorders: Secondary | ICD-10-CM

## 2019-04-02 DIAGNOSIS — I10 Essential (primary) hypertension: Secondary | ICD-10-CM

## 2019-04-02 NOTE — Progress Notes (Signed)
SLEEP MEDICINE CLINIC    Provider:  Larey Seat, MD  Primary Care Physician:  [place], [place] [address]     Referring Provider: Dr Einar Gip, MD Cardiologist         Chief Complaint according to patient   Patient presents with:    . New Patient (Initial Visit)     Dr. Einar Gip referred for sleep study- new onset atrial fibrillation, has 20 year- history of DM, has not had a sleep study in the past- snoring is present      HISTORY OF PRESENT ILLNESS:  William Nixon is a 58 y.o. year old Asian male patient of indian descent , and seen on 04/02/2019.    I have the pleasure of seeing William Nixon today, a right-handed Asian male with a possible sleep disorder.  He  has a past medical history of Diabetes mellitus, Hyperlipidemia, Hypertension, Hyperthyroidism, Personal history of colonic polyps - adenomas (08/06/2013), Right hydrocele, and S/P ACL repair.. New onset atrial fib discovered by Dr. Dagmar Hait- patient had a syncope, followed work up with normal EKG, and followed by cardiac monitoring ( atrial fib positive)  , had chemical induced stress test, echocardiogram were normal.      Sleep relevant medical history: Nocturia only once,  No Tonsillectomy, no ENT or cervical injuries or surgeries.     Family medical /sleep history: No other family member is known to have OSA, but strong diabetes history.    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 Pets are present- one dog. Tobacco use never .  ETOH use; wine and single malt, 3/ week.  Caffeine intake in form of Coffee( none ) Soda( none Tea ( yes- almost daily) or energy drinks. Regular exercise in form of running, four miles a day and light weight lifting    Hobbies: cricket    Sleep habits are as follows: The patient's dinner time is between 8.45  PM. The patient goes to bed  at 11 PM and continues to sleep for several hours, wakes for one bathroom break at 4 AM.   The preferred sleep position is laterally- with the support of 2 pillows. Dreams are reportedly frequent/ not vivid.  6.15 AM is the usual rise time- he can sleep a little later when working from home. The patient wakes up with an alarm. Let's it snooze once .  He reports  feeling quite refreshed / restored in AM, without symptoms such as dry mouth, morning headaches or  residual fatigue. He is keeping busy all the time.  Naps are taken frequently, lasting from 10 to 30 minutes and are more  refreshing than nocturnal sleep.    Review of Systems: Out of a complete 14 system review, the patient complains of only the following symptoms, and all other reviewed systems are negative.:   snoring- per wife.    How likely are you to doze in the following situations: 0 = not likely, 1 = slight chance, 2 = moderate chance, 3 = high chance   Sitting and Reading? Watching Television? Sitting inactive in a public place (theater or meeting)? As a passenger in a car for an hour without a break? Lying down in the afternoon when circumstances permit? Sitting and talking to someone? Sitting quietly after lunch without alcohol? In a car, while stopped for a few minutes  in traffic?   Total = 11/ 24 points   FSS endorsed at  13/ 63 points.   Social History   Socioeconomic History  . Marital status: Married    Spouse name: Not on file  . Number of children: 1  . Years of education: Not on file  . Highest education level: Not on file  Occupational History  . Not on file  Tobacco Use  . Smoking status: Never Smoker  . Smokeless tobacco: Never Used  Substance and Sexual Activity  . Alcohol use: Yes    Alcohol/week: 2.0 standard drinks    Types: 2 Standard drinks or equivalent per week    Comment: occasionally  . Drug use: No  . Sexual activity: Not on file  Other Topics Concern  . Not on file  Social  History Narrative  . Not on file   Social Determinants of Health   Financial Resource Strain:   . Difficulty of Paying Living Expenses: Not on file  Food Insecurity:   . Worried About Charity fundraiser in the Last Year: Not on file  . Ran Out of Food in the Last Year: Not on file  Transportation Needs:   . Lack of Transportation (Medical): Not on file  . Lack of Transportation (Non-Medical): Not on file  Physical Activity:   . Days of Exercise per Week: Not on file  . Minutes of Exercise per Session: Not on file  Stress:   . Feeling of Stress : Not on file  Social Connections:   . Frequency of Communication with Friends and Family: Not on file  . Frequency of Social Gatherings with Friends and Family: Not on file  . Attends Religious Services: Not on file  . Active Member of Clubs or Organizations: Not on file  . Attends Archivist Meetings: Not on file  . Marital Status: Not on file    Family History  Problem Relation Age of Onset  . Alcohol abuse Other   . Diabetes Other   . Hypertension Other   . Diabetes Mother   . Diabetes Father   . Diabetes Brother   . Diabetes Sister   . Colon cancer Neg Hx   . Rectal cancer Neg Hx   . Stomach cancer Neg Hx   . Esophageal cancer Neg Hx   . Pancreatic cancer Neg Hx   . Colon polyps Neg Hx     Past Medical History:  Diagnosis Date  . Diabetes mellitus    type II  . Hyperlipidemia    pt. states that hdl ldl was elevated denies hyperlipidemia  . Hypertension   . Hyperthyroidism    pt. denies having any thyroid issues  . Personal history of colonic polyps - adenomas 08/06/2013   07/2013 - 3 diminutive adenomas - repeat colonoscopy 2018  . Right hydrocele   . S/P ACL repair    right     Past Surgical History:  Procedure Laterality Date  . COLONOSCOPY    . KNEE SURGERY  2002   right  . POLYPECTOMY       Current Outpatient Medications on File Prior to Visit  Medication Sig Dispense Refill  . apixaban  (ELIQUIS) 5 MG TABS tablet Take 1 tablet (5 mg total) by mouth 2 (two) times daily. 60 tablet 1  . diltiazem (CARDIZEM CD) 180 MG 24 hr capsule Take 1 capsule (180 mg total) by mouth daily. 30 capsule 2  . empagliflozin (JARDIANCE) 25 MG TABS tablet Take  25 mg by mouth daily. 90 tablet 2  . glipiZIDE (GLUCOTROL XL) 5 MG 24 hr tablet Take 1 tablet daily with dinner 90 tablet 3  . glipiZIDE (GLUCOTROL) 5 MG tablet Take 1 tablet (5 mg total) by mouth daily with breakfast. 90 tablet 3  . glucose blood (ONETOUCH VERIO) test strip Use as instructed to check blood sugar 3 times a day. 300 each 12  . linagliptin (TRADJENTA) 5 MG TABS tablet Take 1 tablet (5 mg total) by mouth daily. 90 tablet 1  . losartan (COZAAR) 100 MG tablet Take 1 tablet (100 mg total) by mouth daily. 90 tablet 4  . metFORMIN (GLUCOPHAGE) 1000 MG tablet TAKE 1 TABLET BY MOUTH TWICE A DAY 180 tablet 3  . Multiple Vitamin (MULTIVITAMIN WITH MINERALS) TABS Take 1 tablet by mouth daily. Centrum    . OneTouch Delica Lancets 99991111 MISC Use to check blood sugar 3 times a day. 300 each 12  . rosuvastatin (CRESTOR) 10 MG tablet Take 10 mg by mouth at bedtime.    . sildenafil (REVATIO) 20 MG tablet Take 1 tablet (20 mg total) by mouth at bedtime as needed. 30 tablet 11   Current Facility-Administered Medications on File Prior to Visit  Medication Dose Route Frequency Provider Last Rate Last Admin  . 0.9 %  sodium chloride infusion  500 mL Intravenous Continuous Gatha Mayer, MD      . 0.9 %  sodium chloride infusion  500 mL Intravenous Continuous Gatha Mayer, MD        HBAic was 6.2  No Known Allergies  Physical exam:  Today's Vitals   04/02/19 0854  BP: (!) 161/72  Pulse: (!) 59  Temp: (!) 97.5 F (36.4 C)  TempSrc: Temporal  Weight: 153 lb (69.4 kg)  Height: 5\' 8"  (1.727 m)   Body mass index is 23.26 kg/m.   Wt Readings from Last 3 Encounters:  04/02/19 153 lb (69.4 kg)  03/26/19 150 lb 12.8 oz (68.4 kg)  03/07/19  151 lb (68.5 kg)     Ht Readings from Last 3 Encounters:  04/02/19 5\' 8"  (1.727 m)  03/26/19 5\' 8"  (1.727 m)  03/07/19 5\' 8"  (1.727 m)      General: The patient is awake, alert and appears not in acute distress. The patient is well groomed. Head: Normocephalic, atraumatic. Neck is supple. Mallampati 2, elongated, soft uvula, no lateral narrowing.  neck circumference:14 inches. Nasal airflow is patent.  Retrognathia is not seen.  Dental status: intact Cardiovascular:  irregular rate and cardiac rhythm by pulse,  without distended neck veins. Not palpitations but skips some beats. Respiratory: Lungs are clear to auscultation.  Skin:  Without evidence of ankle edema, or rash. Trunk: The patient's posture is erect.   Neurologic exam : The patient is awake and alert, oriented to place and time.   Memory subjective described as intact.  Attention span & concentration ability appears normal.  Speech is fluent,  without  dysarthria, dysphonia or aphasia.  Mood and affect are appropriate.   Cranial nerves: no loss of smell or taste reported  Pupils are equal and briskly reactive to light. Funduscopic exam deferred. .  Extraocular movements in vertical and horizontal planes were intact and without nystagmus. No Diplopia. Visual fields by finger perimetry are intact. Hearing was intact to soft voice and finger rubbing.    Facial sensation intact to fine touch.  Facial motor strength is symmetric and tongue and uvula move midline.  Neck ROM :  rotation, tilt and flexion extension were normal for age and shoulder shrug was symmetrical.    Motor exam:  Symmetric bulk, tone and ROM.   Normal tone without cog wheeling, symmetric grip strength .   Sensory:  Fine touch, pinprick and vibration were tested  and  normal.  Proprioception tested in the upper extremities was normal.   Coordination: Rapid alternating movements in the fingers/hands were of normal speed.  The Finger-to-nose maneuver was  intact without evidence of ataxia, dysmetria or tremor.   Gait and station: Patient could rise unassisted from a seated position, walked without assistive device.  Stance is of normal width/ base and the patient turned with 3 steps.  Toe and heel walk were deferred.  Deep tendon reflexes: in the  upper and lower extremities are symmetric and intact.  Babinski response was deferred.       After spending a total time of 40 minutes face to face and additional time for physical and neurologic examination, review of laboratory studies,  personal review of imaging studies, reports and results of other testing and review of referral information / records as far as provided in visit, I have established the following assessments:   I have the pleasure of meeting William Nixon today, who was fairly recently diagnosed with paroxysmal atrial fibrillation preceded by a symptom of syncope or loss of consciousness for 1 or 2 minutes.  This happened at the first day of December 2020.  Since then he has had an extensive work-up and no structural cardiac abnormalities have been found.  I was also able to review his laboratory tests and his HbA1c is well controlled at 6.1 as of September 30, his fasting glucose was 134 that day however creatinine and liver function tests were all in normal range. He shows no signs of peripheral circulation problems his hands are warm, there is no swelling or puffiness around the ankle and she has not letter of with his regular exercise routine. Snoring an atrial fibrillation is documented and will require a sleep test w for apnea evaluation as OSA is a risk factor.    My Plan is to proceed with:  1) PSG or HST , for urgent apnea screening and treatment to follow.    I would like to thank Adrian Prows, MD and [place], [place],  [place] [address] for allowing me to meet with and to take care of this pleasant patient.   In short, William Nixon is  presenting with snoring and new onset atrial fib. , a symptom that can be attributed to OSA.   I plan to follow up either personally or through our NP within 2-3 month.     Electronically signed by: Larey Seat, MD 04/02/2019 9:11 AM  [facility] Board certified by The [employer] of Sleep Medicine and Diplomate of the [employer] of Sleep Medicine. Board certified In Neurology through the [place], Fellow of the [employer] of Neurology. Medical Director of [facility].

## 2019-04-02 NOTE — Patient Instructions (Signed)

## 2019-04-05 ENCOUNTER — Ambulatory Visit: Payer: BC Managed Care – PPO | Admitting: Internal Medicine

## 2019-04-07 ENCOUNTER — Other Ambulatory Visit: Payer: Self-pay | Admitting: Internal Medicine

## 2019-04-08 ENCOUNTER — Ambulatory Visit: Payer: BC Managed Care – PPO | Admitting: Cardiology

## 2019-04-09 ENCOUNTER — Telehealth: Payer: Self-pay | Admitting: Neurology

## 2019-04-09 ENCOUNTER — Encounter: Payer: Self-pay | Admitting: Neurology

## 2019-04-09 NOTE — Telephone Encounter (Signed)
The sleep lab has not received approval yet. Once I get the approval from [facility], I will be calling patient to schedule.

## 2019-04-09 NOTE — Telephone Encounter (Signed)
Pt called stating that he has received a letter from his insurance company saying that he has an approval and he would like to know what the next step is. Please advise.

## 2019-04-22 ENCOUNTER — Ambulatory Visit (INDEPENDENT_AMBULATORY_CARE_PROVIDER_SITE_OTHER): Payer: BC Managed Care – PPO | Admitting: Neurology

## 2019-04-22 DIAGNOSIS — E119 Type 2 diabetes mellitus without complications: Secondary | ICD-10-CM

## 2019-04-22 DIAGNOSIS — I152 Hypertension secondary to endocrine disorders: Secondary | ICD-10-CM

## 2019-04-22 DIAGNOSIS — E1169 Type 2 diabetes mellitus with other specified complication: Secondary | ICD-10-CM

## 2019-04-22 DIAGNOSIS — G4733 Obstructive sleep apnea (adult) (pediatric): Secondary | ICD-10-CM | POA: Diagnosis not present

## 2019-04-22 DIAGNOSIS — R0683 Snoring: Secondary | ICD-10-CM

## 2019-04-22 DIAGNOSIS — I480 Paroxysmal atrial fibrillation: Secondary | ICD-10-CM

## 2019-04-22 DIAGNOSIS — E1159 Type 2 diabetes mellitus with other circulatory complications: Secondary | ICD-10-CM

## 2019-04-29 ENCOUNTER — Other Ambulatory Visit: Payer: Self-pay | Admitting: Internal Medicine

## 2019-05-03 ENCOUNTER — Other Ambulatory Visit: Payer: Self-pay | Admitting: Cardiology

## 2019-05-07 ENCOUNTER — Telehealth: Payer: Self-pay | Admitting: Neurology

## 2019-05-07 NOTE — Telephone Encounter (Signed)
Patient is calling in requesting his sleep study results

## 2019-05-08 ENCOUNTER — Telehealth: Payer: Self-pay | Admitting: Neurology

## 2019-05-08 NOTE — Progress Notes (Signed)
Summary & Diagnosis:   Moderate degree of obstructive Sleep apnea with AHI 19.6/h, not  REM sleep accentuated and associated with snoring.   Recommendations:     OSA is confirmed, associated with snoring. I recommend for atrial  fibrillation patients to use positive airway pressure therapy.  Without atrial fibrillation, a dental device could be considered.   Auto CPAP device, 5-16 cm water, 1 cm EPR and heated humidity  with interface of choice.  Interpreting Physician: Larey Seat, MD

## 2019-05-08 NOTE — Telephone Encounter (Signed)
please inform Dr Einar Gip about result, too.

## 2019-05-08 NOTE — Telephone Encounter (Signed)
  Thank you so much.  I saw this. Thanks and guess patient will be contacted for equipment.   Moderate degree of obstructive Sleep apnea with AHI 19.6/h, not  REM sleep accentuated and associated with snoring.   Recommendations:     OSA is confirmed, associated with snoring. I recommend for atrial  fibrillation patients to use positive airway pressure therapy.  Without atrial fibrillation, a dental device could be considered.   Auto CPAP device, 5-16 cm water, 1 cm EPR and heated humidity  with interface of choice.  Interpreting Physician: Larey Seat, MD

## 2019-05-08 NOTE — Telephone Encounter (Signed)
I called pt. I advised pt that Dr. Brett Fairy reviewed their sleep study results and found that pt has severe sleep apnea. Dr. Brett Fairy recommends starts auto CPAP. I reviewed PAP compliance expectations with the pt. Pt is agreeable to starting a CPAP. I advised pt that an order will be sent to a DME, [facility], and [facility] will call the pt within about one week after they file with the pt's insurance. [facility] will show the pt how to use the machine, fit for masks, and troubleshoot the CPAP if needed. A follow up appt was made for insurance purposes with Dr. Brett Fairy on March 30,2021 at 9:30 am. Pt verbalized understanding to arrive 15 minutes early and bring their CPAP. A letter with all of this information in it will be mailed to the pt as a reminder. I verified with the pt that the address we have on file is correct. Pt verbalized understanding of results. Pt had no questions at this time but was encouraged to call back if questions arise. I have sent the order to [facility] and have received confirmation that they have received the order.

## 2019-05-08 NOTE — Telephone Encounter (Signed)
-----   Message from Larey Seat, MD sent at 05/08/2019  1:31 PM EST ----- Summary & Diagnosis:   Moderate degree of obstructive Sleep apnea with AHI 19.6/h, not  REM sleep accentuated and associated with snoring.   Recommendations:     OSA is confirmed, associated with snoring. I recommend for atrial  fibrillation patients to use positive airway pressure therapy.  Without atrial fibrillation, a dental device could be considered.   Auto CPAP device, 5-16 cm water, 1 cm EPR and heated humidity  with interface of choice.  Interpreting Physician: Larey Seat, MD

## 2019-05-08 NOTE — Addendum Note (Signed)
Addended by: Larey Seat on: 05/08/2019 01:31 PM   Modules accepted: Orders

## 2019-05-08 NOTE — Procedures (Signed)
  Patient Information     First Name: William Last Name: Nixon ID: [id number]  Birth Date: [birth year] Age: 59 Gender: Male  Referring Provider:  BMI: 23.1 (W=152 lb, H=5' 8'')  Neck Circ.:  14 '' Epworth:  11/24   Sleep Study Information    Study Date: Apr 22, 2019 S/H/A Version: 001.001.001.001 / 4.1.1528 / 77  History:    ARIEH FRASIER is a right-handed Asian male patient with a medical history of Diabetes mellitus, Hyperlipidemia, Hypertension, Hyperthyroidism, Personal history of colonic polyps - adenomas (08/06/2013), Right hydrocele, and S/P ACL repair. Referred for New onset atrial fib discovered by Dr. Dagmar Hait- patient had a syncope, followed work up with normal EKG, and followed by cardiac monitoring (atrial fib positive) , also had chemical induced stress test, echocardiogram was normal. Rule out apnea.        Summary & Diagnosis:    Moderate degree of obstructive Sleep apnea with AHI 19.6/h, not REM sleep accentuated and associated with snoring.   Recommendations:      OSA is confirmed, associated with snoring. I recommend for atrial fibrillation patients to use positive airway pressure therapy. Without atrial fibrillation, a dental device could be considered.  Auto CPAP device, 5-16 cm water, 1 cm EPR and heated humidity with interface of choice.  Interpreting Physician:  Larey Seat, MD             Sleep Summary  Oxygen Saturation Statistics   Start Study Time: End Study Time: Total Recording Time:          10:21:12 PM         6:41:18 AM  8 h, 20 min  Total Sleep Time % REM of Sleep Time:  6 h, 27 min  22.2    Mean: 95 Minimum: 89 Maximum: 99  Mean of Desaturations Nadirs (%):   92  Oxygen Desaturation. %: 4-9 10-20 >20 Total  Events Number Total  18 100.0  0 0.0  0 0.0  18 100.0  Oxygen Saturation: <90 <=88 <85 <80 <70  Duration (minutes): Sleep % 0.1 0.0 0.0 0.0 0.0 0.0 0.0 0.0 0.0 0.0     Respiratory Indices      Total Events REM  NREM All Night  pRDI:  58  pAHI:  53 ODI:  18  pAHIc:  6  % CSR: 0.0 15.7 15.7 10.4 0.0 21.9 19.9 6.4 2.5 21.5 19.6 6.7 2.4       Pulse Rate Statistics during Sleep (BPM)      Mean: 83 Minimum: 48 Maximum: 143    Indices are calculated using technically valid sleep time of 5 h, 42 min. Central-Indices are calculated using technically valid sleep time of  5  h, 30 min. pRDI/pAHI are calculated using oxi desaturations ? 3%  Body Position Statistics  Position Supine Prone Right Left Non-Supine  Sleep (min) 141.5 161.0 66.0 19.1 246.1  Sleep % 36.5 41.5 17.0 4.9 63.5  pRDI 36.4 29.8 3.8 0.0 17.7  pAHI 34.6 27.3 2.5 0.0 15.8  ODI 16.4 7.7 0.0 0.0 4.2     Snoring Statistics

## 2019-05-09 DIAGNOSIS — Z1212 Encounter for screening for malignant neoplasm of rectum: Secondary | ICD-10-CM | POA: Diagnosis not present

## 2019-05-22 DIAGNOSIS — G4733 Obstructive sleep apnea (adult) (pediatric): Secondary | ICD-10-CM | POA: Diagnosis not present

## 2019-05-29 DIAGNOSIS — E119 Type 2 diabetes mellitus without complications: Secondary | ICD-10-CM | POA: Diagnosis not present

## 2019-05-29 DIAGNOSIS — H43823 Vitreomacular adhesion, bilateral: Secondary | ICD-10-CM | POA: Diagnosis not present

## 2019-05-29 DIAGNOSIS — H5213 Myopia, bilateral: Secondary | ICD-10-CM | POA: Diagnosis not present

## 2019-05-29 DIAGNOSIS — H2513 Age-related nuclear cataract, bilateral: Secondary | ICD-10-CM | POA: Diagnosis not present

## 2019-05-30 ENCOUNTER — Encounter: Payer: Self-pay | Admitting: Neurology

## 2019-05-30 ENCOUNTER — Ambulatory Visit: Payer: BC Managed Care – PPO | Admitting: Cardiology

## 2019-05-31 NOTE — Telephone Encounter (Signed)
Patient report dry mouth, ill fitting interface.  His dental doctor recommend to look at a dental device- protecting the teeth and treating apnea.  He will try a second facemask on Monday- and if this fails, too he will try referral to sleep dentist to see if bcbs will cover the dental device.   CD

## 2019-06-01 ENCOUNTER — Other Ambulatory Visit: Payer: Self-pay | Admitting: Cardiology

## 2019-06-02 ENCOUNTER — Other Ambulatory Visit: Payer: Self-pay | Admitting: Internal Medicine

## 2019-06-04 NOTE — Telephone Encounter (Signed)
From pt

## 2019-06-04 NOTE — Telephone Encounter (Signed)
Patient called me to discuss the fitting mask and also need for tender processes, but however states that Dr. Roddie Mc has provided him with a new mask which he is able to tolerate and is presently feeling well.  He will continue to use this and will contact us if he has any problems.

## 2019-06-07 ENCOUNTER — Encounter: Payer: Self-pay | Admitting: Neurology

## 2019-06-20 ENCOUNTER — Other Ambulatory Visit: Payer: Self-pay | Admitting: Cardiology

## 2019-06-20 DIAGNOSIS — I10 Essential (primary) hypertension: Secondary | ICD-10-CM

## 2019-06-20 DIAGNOSIS — I480 Paroxysmal atrial fibrillation: Secondary | ICD-10-CM

## 2019-06-21 ENCOUNTER — Encounter: Payer: Self-pay | Admitting: Cardiology

## 2019-06-21 ENCOUNTER — Other Ambulatory Visit: Payer: Self-pay

## 2019-06-21 ENCOUNTER — Ambulatory Visit: Payer: BC Managed Care – PPO | Admitting: Cardiology

## 2019-06-21 VITALS — BP 140/80 | HR 65 | Temp 98.0°F | Resp 16 | Ht 68.0 in | Wt 149.8 lb

## 2019-06-21 DIAGNOSIS — I10 Essential (primary) hypertension: Secondary | ICD-10-CM

## 2019-06-21 DIAGNOSIS — I480 Paroxysmal atrial fibrillation: Secondary | ICD-10-CM

## 2019-06-21 DIAGNOSIS — Z9989 Dependence on other enabling machines and devices: Secondary | ICD-10-CM | POA: Diagnosis not present

## 2019-06-21 DIAGNOSIS — G4733 Obstructive sleep apnea (adult) (pediatric): Secondary | ICD-10-CM | POA: Diagnosis not present

## 2019-06-21 MED ORDER — LOSARTAN POTASSIUM 100 MG PO TABS: 100.0000 mg | ORAL_TABLET | Freq: Every evening | ORAL | 4 refills | Status: DC

## 2019-06-21 NOTE — Progress Notes (Signed)
Primary Physician:  Prince Solian, MD   Patient ID: William Nixon, male    DOB: 1961/02/04, 59 y.o.   MRN: [id number]  Subjective:    Chief Complaint  Patient presents with  . Atrial Fibrillation    2 month follow up    HPI: William Nixon  is a 59 y.o. male male  with type 2 diabetes, hypertension, hyperthyroidism, vasovagal syncope in Oct 2020. He had an episode similar to this approximately 2 years ago, an event monitor had revealed paroxysmal episodes of atrial fibrillation with RVR.  He was started on anticoagulation, sleep study confirmed obstructive sleep apnea.  He is now compliant with CPAP, but still having some difficulty.  He has an appointment to follow-up with Dr. Roddie Mc.  He does run 4-5 miles per day that he tolerates well. Still has occasional palpitations. No chest pian or dyspnea.    Past Medical History:  Diagnosis Date  . Diabetes mellitus    type II  . Hyperlipidemia    pt. states that hdl ldl was elevated denies hyperlipidemia  . Hypertension   . Hyperthyroidism    pt. denies having any thyroid issues  . Personal history of colonic polyps - adenomas 08/06/2013   07/2013 - 3 diminutive adenomas - repeat colonoscopy 2018  . Right hydrocele   . S/P ACL repair    right     Past Surgical History:  Procedure Laterality Date  . COLONOSCOPY    . KNEE SURGERY  2002   right  . POLYPECTOMY     Social History   Tobacco Use  . Smoking status: Never Smoker  . Smokeless tobacco: Never Used  Substance Use Topics  . Alcohol use: Yes    Alcohol/week: 2.0 standard drinks    Types: 2 Standard drinks or equivalent per week    Comment: occasionally    Family History  Problem Relation Age of Onset  . Alcohol abuse Other   . Diabetes Other   . Hypertension Other   . Diabetes Mother   . Diabetes Father   . Diabetes Brother   . Diabetes Sister   . Colon cancer Neg Hx   . Rectal cancer Neg Hx   . Stomach cancer Neg Hx   . Esophageal cancer Neg  Hx   . Pancreatic cancer Neg Hx   . Colon polyps Neg Hx     Review of Systems  Cardiovascular: Positive for palpitations. Negative for chest pain, dyspnea on exertion and leg swelling.  Respiratory: Positive for snoring.   Gastrointestinal: Negative for melena.   Objective:  Blood pressure 140/80, pulse 65, temperature 98 F (36.7 C), temperature source Temporal, resp. rate 16, height '5\' 8"'  (1.727 m), weight 149 lb 12.8 oz (67.9 kg), SpO2 99 %. Body mass index is 22.78 kg/m.    Physical Exam  Constitutional: He is oriented to person, place, and time. Vital signs are normal. He appears well-developed and well-nourished.  Cardiovascular: Normal rate, regular rhythm and intact distal pulses. Frequent extrasystoles are present.  Murmur heard. High-pitched blowing mid to late systolic murmur is present with a grade of 2/6 at the apex. Pulmonary/Chest: Effort normal and breath sounds normal. No accessory muscle usage. No respiratory distress.  Abdominal: Soft. Bowel sounds are normal.  Neurological: He is oriented to person, place, and time.  Vitals reviewed.  Radiology: No results found.  Laboratory examination:   01/16/2019: Cholesterol 185, triglycerides 185, HDL 25, LDL 123.  Creatinine 0.8, EGFR 99/120, potassium 4.6, CMP  normal.  CBC normal.  TSH elevated at 5.37.  Hemoglobin A1c 6.1%.  CMP Latest Ref Rng & Units 01/16/2018 01/10/2017 01/06/2016  Glucose 70 - 99 mg/dL 134(H) 121(H) 96  BUN 6 - 23 mg/dL '15 10 11  ' Creatinine 0.40 - 1.50 mg/dL 0.88 0.78 0.77  Sodium 135 - 145 mEq/L 139 138 138  Potassium 3.5 - 5.1 mEq/L 4.4 4.1 3.7  Chloride 96 - 112 mEq/L 102 100 101  CO2 19 - 32 mEq/L '29 31 28  ' Calcium 8.4 - 10.5 mg/dL 9.3 9.5 8.8  Total Protein 6.0 - 8.3 g/dL 7.3 7.2 6.8  Total Bilirubin 0.2 - 1.2 mg/dL 0.7 0.9 0.9  Alkaline Phos 39 - 117 U/L 68 65 64  AST 0 - 37 U/L '25 23 22  ' ALT 0 - 53 U/L '28 24 21   ' CBC Latest Ref Rng & Units 01/16/2018 01/10/2017 01/06/2016  WBC 4.0 - 10.5  K/uL 3.6(L) 5.8 5.1  Hemoglobin 13.0 - 17.0 g/dL 15.5 15.6 14.5  Hematocrit 39.0 - 52.0 % 44.2 45.9 41.6  Platelets 150.0 - 400.0 K/uL 136.0(L) 148.0(L) 146.0(L)   Lipid Panel     Component Value Date/Time   CHOL 163 01/16/2018 1008   TRIG 165.0 (H) 01/16/2018 1008   HDL 27.00 (L) 01/16/2018 1008   CHOLHDL 6 01/16/2018 1008   VLDL 33.0 01/16/2018 1008   LDLCALC 103 (H) 01/16/2018 1008   LDLDIRECT 96.9 11/07/2012 0806   HEMOGLOBIN A1C Lab Results  Component Value Date   HGBA1C 6.1 (A) 10/10/2018   External labs: Cholesterol, total 109.000 m 03/25/2019 HDL 29 MG/DL 03/25/2019 LDL 57.000 mg 03/25/2019 Triglycerides 114.000 03/25/2019  A1C 6.200 % 03/25/2019; TSH 5.660 03/01/2019  Hemoglobin 15.400 g/ 03/01/2019  Creatinine, Serum 0.800 mg/ 03/25/2019 Potassium 4.400 01/16/2018 ALT (SGPT) 25.000 uni 03/25/2019  Cardiac Studies:   Sleep study 04/22/2019: Moderate degree of obstructive Sleep apnea with AHI 19.6/h, not  REM sleep accentuated and associated with snoring. Follows Dr. Brett Fairy.  Recommendations:  OSA is confirmed, associated with snoring. I recommend for atrial  fibrillation patients to use positive airway pressure therapy.  Without atrial fibrillation, a dental device could be considered.   Lexiscan Sestamibi stress test 03/20/2019: No previous exam available for comparison. Lexiscan nuclear stress test performed using 1-day protocol. Myocardial perfusion imaging is normal. Left ventricular ejection fraction is  60% with normal wall motion. Low risk study.  Echocardiogram 122/05/2018: Normal LV systolic function with EF 61%. Left ventricle cavity is normal in size. Moderate concentric hypertrophy of the left ventricle. Normal global wall motion. Normal diastolic function. No definite mitral valve prolapse. Mild (Grade I) mitral regurgitation. Mild tricuspid regurgitation. Estimated pulmonary artery systolic pressure is 93-81 mmHg, with estimated RA pressure 10-15  mmHg.   Holter Monitor for 24 hours  03/07/2019:  Paroxysmal atrial fibrillation with rapid ventricular response, episode lasted at least for 4 hours during the daytime lasting from 1:17 PM through 6:27 PM, occasional PVCs.  No symptoms reported.  Total ventricular ectopics 230 beats.  Forbidden couplets.  Maximum A. fib with RVR rate was 144 bpm.  Assessment:     ICD-10-CM   1. Paroxysmal atrial fibrillation ([place]). CHA2DS2-VASc Score is 2.  Yearly risk of stroke: 2.3% (HTN, DM).     I48.0   2. Primary hypertension  I10   3. Syncope and collapse  R55   4. OSA on CPAP  G47.33    Z99.89     EKG 03/07/2019: Normal sinus rhythm at 66 bpm, normal  axis, IRBBB. Nonspecific T wave abnormality.   Recommendations:   William Nixon  is a 59 y.o. male  with type 2 diabetes, hypertension, hyperthyroidism, vasovagal syncope in Oct 2020. He had an episode similar to this approximately 2 years ago, an event monitor had revealed paroxysmal episodes of atrial fibrillation with RVR.  He was started on anticoagulation, sleep study confirmed obstructive sleep apnea.  He is now compliant with CPAP.  I reviewed his external data of labs, diabetes is well controlled, lipids are also under excellent control, now he is on CPAP, could consider discontinuation of anticoagulation as his cardioembolic risk is <5.3 in male and also know that the sleep apnea is treated.  Patient prefers to do this as well, understands the risks and benefits associated with this. I would wait until his sleep disorder is well treated prior to discontinuing eliquis.  His blood pressure was slightly limited with systolic blood pressure greater than 125 mmHg, will switch his losartan to taking in the evening and will continue to monitor.  If blood pressure remains greater than 912 mmHg systolic, will consider addition of amlodipine either 2.5 or 5 mg daily.  I will see him back in 6 months or sooner if problems.  Adrian Prows, MD,  [facility] 06/21/2019, 11:26 AM [facility]

## 2019-07-16 ENCOUNTER — Ambulatory Visit: Payer: Self-pay | Admitting: Neurology

## 2019-07-20 DIAGNOSIS — G4733 Obstructive sleep apnea (adult) (pediatric): Secondary | ICD-10-CM | POA: Diagnosis not present

## 2019-07-22 ENCOUNTER — Encounter: Payer: Self-pay | Admitting: Neurology

## 2019-07-24 ENCOUNTER — Encounter: Payer: Self-pay | Admitting: Neurology

## 2019-07-24 ENCOUNTER — Ambulatory Visit (INDEPENDENT_AMBULATORY_CARE_PROVIDER_SITE_OTHER): Payer: BC Managed Care – PPO | Admitting: Neurology

## 2019-07-24 ENCOUNTER — Other Ambulatory Visit: Payer: Self-pay

## 2019-07-24 VITALS — BP 128/62 | HR 91 | Temp 97.2°F | Ht 68.0 in | Wt 145.0 lb

## 2019-07-24 DIAGNOSIS — E119 Type 2 diabetes mellitus without complications: Secondary | ICD-10-CM

## 2019-07-24 DIAGNOSIS — Z9989 Dependence on other enabling machines and devices: Secondary | ICD-10-CM

## 2019-07-24 DIAGNOSIS — I10 Essential (primary) hypertension: Secondary | ICD-10-CM

## 2019-07-24 DIAGNOSIS — I480 Paroxysmal atrial fibrillation: Secondary | ICD-10-CM | POA: Insufficient documentation

## 2019-07-24 DIAGNOSIS — E1159 Type 2 diabetes mellitus with other circulatory complications: Secondary | ICD-10-CM | POA: Diagnosis not present

## 2019-07-24 DIAGNOSIS — G4733 Obstructive sleep apnea (adult) (pediatric): Secondary | ICD-10-CM | POA: Diagnosis not present

## 2019-07-24 NOTE — Patient Instructions (Signed)

## 2019-07-24 NOTE — Progress Notes (Signed)
SLEEP MEDICINE CLINIC    Provider:  Larey Seat, MD  Primary Care Physician:  William Nixon, [place] [address]     Referring Provider: Dr William Gip, MD Cardiologist         Chief Complaint according to patient   Patient presents with:    . New Patient (Initial Visit)     Dr. Einar Nixon referred for sleep study- new onset atrial fibrillation, has 20 year- history of DM, has not had a sleep study in the past- snoring is present      HISTORY OF PRESENT ILLNESS:  William Nixon,  is a 59 y.o. year old Asian male patient of [place] descent , and seen in a RV on 07/24/2019. Mr. William Nixon had undergone a home sleep test which showed a moderate degree of sleep apnea of obstructive origin with an AHI of 19.6.  There was some snoring recorded but no REM sleep accentuation was noted.  I prescribed an auto CPAP device with a setting between 5 and 16 cmH2O pressure and 1 cm EPR.  In the meantime Dr. Arbie Nixon has tried multiple different interfaces and he had for a while also tried melatonin but has given ample melatonin.  His compliance for CPAP is excellent he has used it 87% of the time only 1 day less than 4 hours with an average usage time of 5 hours 6 minutes at night he still feels that he does not sleep through the night or if has still fragmented sleep.  The AutoSet has the pressure settings that I have quoted above and his residual AHI is 0.6/h so from an AHI of 19.6-0.6 is a very good resolution.  There is no Cheyne-Stokes respiration noted.  The 95th percentile pressure is at 8.9 cmH2O and the air leaks are really no the 95th percentile air leak is 1.1 L/min that is physiologically normal.  So we are meeting today to discuss not just the compliance of CPAP but also how we can increase sleep duration and decrease sleep fragmentation.  He works from home and has gained an hour of morning sleep. He works in Engineer, mining.  No on call duties. 8-5 office hours.  Has been  drinking tea and alcohol on occasion. 1 glass only at a time.      I have the pleasure of seeing William Nixon 04-02-2018, [name] with a possible sleep disorder.  He  has a past medical history of Diabetes mellitus, Hyperlipidemia, Hypertension, Hyperthyroidism, Personal history of colonic polyps - adenomas (08/06/2013), Right hydrocele, and S/P ACL repair.. New onset atrial fib discovered by Dr. Dagmar Nixon- patient had a Nixon, followed work up with normal EKG, and followed by cardiac monitoring ( atrial fib positive)  , had chemical induced stress test, echocardiogram were normal.      Sleep relevant medical history: Nocturia only once,  No Tonsillectomy, no ENT or cervical injuries or surgeries.   Family medical /sleep history: No other family member is known to have OSA, but strong diabetes history.   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 Pets are present- one dog. Tobacco use never .  ETOH use; wine and single malt, 3/ week.  Caffeine intake in form of Coffee( none ) Soda( none Tea ( yes- almost daily) or energy drinks. Regular exercise in form of running, four miles a  day and light weight lifting    Hobbies: cricket    Sleep habits are as follows: The patient's dinner time is between 8.45  PM. The patient goes to bed at 11 PM and continues to sleep for several hours, wakes for one bathroom break at 4 AM.   The preferred sleep position is laterally- with the support of 2 pillows. Dreams are reportedly frequent/ not vivid.  6.15 AM is the usual rise time- he can sleep a little later when working from home. The patient wakes up with an alarm. Let's it snooze once .  He reports  feeling quite refreshed / restored in AM, without symptoms such as dry mouth, morning headaches or  residual fatigue. He is keeping busy all the time.  Naps are taken  frequently, lasting from 10 to 30 minutes and are more  refreshing than nocturnal sleep.    Review of Systems: Out of a complete 14 system review, the patient complains of only the following symptoms, and all other reviewed systems are negative.:   snoring- per wife.    How likely are you to doze in the following situations: 0 = not likely, 1 = slight chance, 2 = moderate chance, 3 = high chance   Sitting and Reading? Watching Television? Sitting inactive in a public place (theater or meeting)? As a passenger in a car for an hour without a break? Lying down in the afternoon when circumstances permit? Sitting and talking to someone? Sitting quietly after lunch without alcohol? In a car, while stopped for a few minutes in traffic?   Total = 11/ 24 points   FSS endorsed at  13/ 63 points.   Social History   Socioeconomic History  . Marital status: Married    Spouse name: Not on file  . Number of children: 1  . Years of education: Not on file  . Highest education level: Not on file  Occupational History  . Not on file  Tobacco Use  . Smoking status: Never Smoker  . Smokeless tobacco: Never Used  Substance and Sexual Activity  . Alcohol use: Yes    Alcohol/week: 2.0 standard drinks    Types: 2 Standard drinks or equivalent per week    Comment: occasionally  . Drug use: No  . Sexual activity: Not on file  Other Topics Concern  . Not on file  Social History Narrative  . Not on file   Social Determinants of Health   Financial Resource Strain:   . Difficulty of Paying Living Expenses:   Food Insecurity:   . Worried About Charity fundraiser in the Last Year:   . Arboriculturist in the Last Year:   Transportation Needs:   . Film/video editor (Medical):   Marland Kitchen Lack of Transportation (Non-Medical):   Physical Activity:   . Days of Exercise per Week:   . Minutes of Exercise per Session:   Stress:   . Feeling of Stress :   Social Connections:   . Frequency of  Communication with Friends and Family:   . Frequency of Social Gatherings with Friends and Family:   . Attends Religious Services:   . Active Member of Clubs or Organizations:   . Attends Archivist Meetings:   Marland Kitchen Marital Status:     Family History  Problem Relation Age of Onset  . Alcohol abuse Other   . Diabetes Other   . Hypertension Other   . Diabetes Mother   . Diabetes  Father   . Diabetes Brother   . Diabetes Sister   . Colon cancer Neg Hx   . Rectal cancer Neg Hx   . Stomach cancer Neg Hx   . Esophageal cancer Neg Hx   . Pancreatic cancer Neg Hx   . Colon polyps Neg Hx     Past Medical History:  Diagnosis Date  . Diabetes mellitus    type II  . Hyperlipidemia    pt. states that hdl ldl was elevated denies hyperlipidemia  . Hypertension   . Hyperthyroidism    pt. denies having any thyroid issues  . Personal history of colonic polyps - adenomas 08/06/2013   07/2013 - 3 diminutive adenomas - repeat colonoscopy 2018  . Right hydrocele   . S/P ACL repair    right     Past Surgical History:  Procedure Laterality Date  . COLONOSCOPY    . KNEE SURGERY  2002   right  . POLYPECTOMY       Current Outpatient Medications on File Prior to Visit  Medication Sig Dispense Refill  . diltiazem (CARDIZEM CD) 180 MG 24 hr capsule Take 180 mg by mouth daily.    Marland Kitchen ELIQUIS 5 MG TABS tablet TAKE 1 TABLET BY MOUTH TWO TIMES A DAY 60 tablet 0  . glipiZIDE (GLUCOTROL XL) 5 MG 24 hr tablet Take 1 tablet daily with dinner 90 tablet 3  . glipiZIDE (GLUCOTROL) 5 MG tablet TAKE 1 TABLET BY MOUTH EVERY DAY WITH BREAKFAST 90 tablet 3  . glucose blood (ONETOUCH VERIO) test strip Use as instructed to check blood sugar 3 times a day. 300 each 12  . JANUVIA 100 MG tablet Take 100 mg by mouth daily.    Marland Kitchen JARDIANCE 25 MG TABS tablet TAKE 1 TABLET BY MOUTH EVERY DAY 90 tablet 1  . losartan (COZAAR) 100 MG tablet Take 1 tablet (100 mg total) by mouth at bedtime. 90 tablet 4  . metFORMIN  (GLUCOPHAGE) 1000 MG tablet TAKE 1 TABLET BY MOUTH TWICE A DAY 180 tablet 3  . Multiple Vitamin (MULTIVITAMIN WITH MINERALS) TABS Take 1 tablet by mouth daily. Centrum    . OneTouch Delica Lancets 99991111 MISC Use to check blood sugar 3 times a day. 300 each 12  . rosuvastatin (CRESTOR) 10 MG tablet Take 10 mg by mouth at bedtime.    . sildenafil (REVATIO) 20 MG tablet Take 1 tablet (20 mg total) by mouth at bedtime as needed. 30 tablet 11  . linagliptin (TRADJENTA) 5 MG TABS tablet Take 1 tablet (5 mg total) by mouth daily. (Patient not taking: Reported on 07/24/2019) 90 tablet 1   No current facility-administered medications on file prior to visit.    HBAic was 6.2  No Known Allergies  Physical exam:  Today's Vitals   07/24/19 1319  BP: 128/62  Pulse: 91  Temp: (!) 97.2 F (36.2 C)  Weight: 145 lb (65.8 kg)  Height: 5\' 8"  (1.727 m)   Body mass index is 22.05 kg/m.   Wt Readings from Last 3 Encounters:  07/24/19 145 lb (65.8 kg)  06/21/19 149 lb 12.8 oz (67.9 kg)  04/02/19 153 lb (69.4 kg)     Ht Readings from Last 3 Encounters:  07/24/19 5\' 8"  (1.727 m)  06/21/19 5\' 8"  (1.727 m)  04/02/19 5\' 8"  (1.727 m)      General: The patient is awake, alert and appears not in acute distress. The patient is well groomed. Head: Normocephalic, atraumatic. Neck is supple.  Mallampati 2, elongated, soft uvula, no lateral narrowing.  neck circumference:14 inches. Nasal airflow is patent.  Retrognathia is not seen.  Dental status: intact Cardiovascular:  irregular rate and cardiac rhythm by pulse,  without distended neck veins. Not palpitations but skips some beats. Respiratory: Lungs are clear to auscultation.  Skin:  Without evidence of ankle edema, or rash. Trunk: The patient's posture is erect.   Neurologic exam : The patient is awake and alert, oriented to place and time.   Memory subjective described as intact.  Attention span & concentration ability appears normal.  Speech is  fluent,  without  dysarthria, dysphonia or aphasia.  Mood and affect are appropriate.   Cranial nerves: no loss of smell or taste reported  Pupils are equal and briskly reactive to light. Funduscopic exam deferred. .  Extraocular movements in vertical and horizontal planes were intact and without nystagmus. No Diplopia. Visual fields by finger perimetry are intact. Hearing was intact to soft voice and finger rubbing.    Facial sensation intact to fine touch.  Facial motor strength is symmetric and tongue and uvula move midline.  Neck ROM : rotation, tilt and flexion extension were normal for age and shoulder shrug was symmetrical.    Motor exam:  Symmetric bulk, tone and ROM.   Normal tone without cog wheeling, symmetric grip strength .   Sensory:  Fine touch, pinprick and vibration were tested  and  normal.  Proprioception tested in the upper extremities was normal.   Coordination: Rapid alternating movements in the fingers/hands were of normal speed.  The Finger-to-nose maneuver was intact without evidence of ataxia, dysmetria or tremor.   Gait and station: Patient could rise unassisted from a seated position, walked without assistive device.  Stance is of normal width/ base and the patient turned with 3 steps.  Toe and heel walk were deferred.  Deep tendon reflexes: in the  upper and lower extremities are symmetric and intact.  Babinski response was deferred.       After spending a total time of 40 minutes face to face and additional time for physical and neurologic examination, review of laboratory studies,  personal review of imaging studies, reports and results of other testing and review of referral information / records as far as provided in visit, I have established the following assessments:   I have the pleasure of meeting Mr. Kliewer today, he was diagnosed with OSA,   My Plan is to proceed with: use of CPAP as currently reached .   try [employer].   I would like to thank  Adrian Prows, MD and William Nixon, [address] [place],  [place] [address] for allowing me to meet with and to take care of this pleasant patient.   In short, William Nixon is presenting with OSA, snoring in the setting of new onset atrial fib. , all of which can be controlled by CPAP  I plan to follow up either personally or through our NP within 2-3 month.     Electronically signed by: William Seat, MD 07/24/2019 1:53 PM  [facility] Board certified by The [employer] of Sleep Medicine and Diplomate of the [employer] of Sleep Medicine. Board certified In Neurology through the [place], Fellow of the [employer] of Neurology. Medical Director of [facility].

## 2019-07-30 ENCOUNTER — Other Ambulatory Visit: Payer: Self-pay | Admitting: Cardiology

## 2019-08-02 ENCOUNTER — Other Ambulatory Visit: Payer: Self-pay | Admitting: Internal Medicine

## 2019-08-12 DIAGNOSIS — E119 Type 2 diabetes mellitus without complications: Secondary | ICD-10-CM | POA: Diagnosis not present

## 2019-08-12 DIAGNOSIS — I480 Paroxysmal atrial fibrillation: Secondary | ICD-10-CM | POA: Diagnosis not present

## 2019-08-12 DIAGNOSIS — E038 Other specified hypothyroidism: Secondary | ICD-10-CM | POA: Diagnosis not present

## 2019-08-12 DIAGNOSIS — I10 Essential (primary) hypertension: Secondary | ICD-10-CM | POA: Diagnosis not present

## 2019-08-12 DIAGNOSIS — Z1389 Encounter for screening for other disorder: Secondary | ICD-10-CM | POA: Diagnosis not present

## 2019-08-19 ENCOUNTER — Other Ambulatory Visit: Payer: Self-pay | Admitting: Cardiology

## 2019-08-19 DIAGNOSIS — G4733 Obstructive sleep apnea (adult) (pediatric): Secondary | ICD-10-CM | POA: Diagnosis not present

## 2019-08-26 DIAGNOSIS — E119 Type 2 diabetes mellitus without complications: Secondary | ICD-10-CM | POA: Diagnosis not present

## 2019-08-26 DIAGNOSIS — I10 Essential (primary) hypertension: Secondary | ICD-10-CM | POA: Diagnosis not present

## 2019-08-26 DIAGNOSIS — R7989 Other specified abnormal findings of blood chemistry: Secondary | ICD-10-CM | POA: Diagnosis not present

## 2019-09-11 DIAGNOSIS — G4733 Obstructive sleep apnea (adult) (pediatric): Secondary | ICD-10-CM | POA: Diagnosis not present

## 2019-09-19 ENCOUNTER — Other Ambulatory Visit: Payer: Self-pay | Admitting: Cardiology

## 2019-09-19 DIAGNOSIS — G4733 Obstructive sleep apnea (adult) (pediatric): Secondary | ICD-10-CM | POA: Diagnosis not present

## 2019-09-26 DIAGNOSIS — E119 Type 2 diabetes mellitus without complications: Secondary | ICD-10-CM | POA: Diagnosis not present

## 2019-09-26 DIAGNOSIS — G4733 Obstructive sleep apnea (adult) (pediatric): Secondary | ICD-10-CM | POA: Diagnosis not present

## 2019-09-26 DIAGNOSIS — I10 Essential (primary) hypertension: Secondary | ICD-10-CM | POA: Diagnosis not present

## 2019-09-27 ENCOUNTER — Other Ambulatory Visit: Payer: Self-pay | Admitting: Cardiology

## 2019-10-09 ENCOUNTER — Other Ambulatory Visit: Payer: Self-pay | Admitting: Internal Medicine

## 2019-10-09 DIAGNOSIS — I10 Essential (primary) hypertension: Secondary | ICD-10-CM

## 2019-10-11 ENCOUNTER — Other Ambulatory Visit: Payer: Self-pay | Admitting: Cardiology

## 2019-10-19 DIAGNOSIS — G4733 Obstructive sleep apnea (adult) (pediatric): Secondary | ICD-10-CM | POA: Diagnosis not present

## 2019-10-20 ENCOUNTER — Other Ambulatory Visit: Payer: Self-pay | Admitting: Cardiology

## 2019-11-12 DIAGNOSIS — E119 Type 2 diabetes mellitus without complications: Secondary | ICD-10-CM | POA: Diagnosis not present

## 2019-11-17 ENCOUNTER — Other Ambulatory Visit: Payer: Self-pay | Admitting: Internal Medicine

## 2019-11-19 ENCOUNTER — Other Ambulatory Visit: Payer: Self-pay | Admitting: Cardiology

## 2019-11-19 DIAGNOSIS — G4733 Obstructive sleep apnea (adult) (pediatric): Secondary | ICD-10-CM | POA: Diagnosis not present

## 2019-11-23 ENCOUNTER — Other Ambulatory Visit: Payer: Self-pay | Admitting: Cardiology

## 2019-11-23 DIAGNOSIS — I480 Paroxysmal atrial fibrillation: Secondary | ICD-10-CM

## 2019-11-23 DIAGNOSIS — I10 Essential (primary) hypertension: Secondary | ICD-10-CM

## 2019-11-23 MED ORDER — DILTIAZEM HCL ER COATED BEADS 180 MG PO CP24: 180.0000 mg | ORAL_CAPSULE | Freq: Every day | ORAL | 3 refills | Status: DC

## 2019-12-12 DIAGNOSIS — E119 Type 2 diabetes mellitus without complications: Secondary | ICD-10-CM | POA: Diagnosis not present

## 2019-12-20 ENCOUNTER — Ambulatory Visit: Payer: BC Managed Care – PPO | Admitting: Cardiology

## 2019-12-20 DIAGNOSIS — G4733 Obstructive sleep apnea (adult) (pediatric): Secondary | ICD-10-CM | POA: Diagnosis not present

## 2019-12-27 ENCOUNTER — Encounter: Payer: Self-pay | Admitting: Cardiology

## 2019-12-27 ENCOUNTER — Other Ambulatory Visit: Payer: Self-pay

## 2019-12-27 ENCOUNTER — Ambulatory Visit: Payer: BC Managed Care – PPO | Admitting: Cardiology

## 2019-12-27 VITALS — BP 149/83 | HR 69 | Ht 68.0 in | Wt 144.0 lb

## 2019-12-27 DIAGNOSIS — G4733 Obstructive sleep apnea (adult) (pediatric): Secondary | ICD-10-CM

## 2019-12-27 DIAGNOSIS — I480 Paroxysmal atrial fibrillation: Secondary | ICD-10-CM

## 2019-12-27 DIAGNOSIS — Z9989 Dependence on other enabling machines and devices: Secondary | ICD-10-CM | POA: Diagnosis not present

## 2019-12-27 DIAGNOSIS — I10 Essential (primary) hypertension: Secondary | ICD-10-CM

## 2019-12-27 NOTE — Progress Notes (Signed)
Primary Physician/Referring:  Prince Solian, MD  Patient ID: William Nixon, male    DOB: 1961-02-20, 59 y.o.   MRN: [id number]  Chief Complaint  Patient presents with  . Atrial Fibrillation  . Follow-up   HPI:    William Nixon  is a 59 y.o. male  with type 2 diabetes, hypertension, hyperthyroidism, vasovagal syncope in Oct 2020. He had an episode similar to this approximately 2 years ago, an event monitor had revealed incidental asymptomatic  paroxysmal episodes of atrial fibrillation with RVR.  He was started on anticoagulation, sleep study confirmed obstructive sleep apnea.  He is now compliant with CPAP.  He presents today for 6 month follow up. The patient has been doing well since his last visit and has no complaints today. He exercises regularly which he tolerates well. He is monitoring his blood pressure at home and states it has been well controlled with measurements typically 120s/70s. He has not had any chest pain, palpitations, shortness of breath, dizziness, or syncope. He is frustrated with diabetes not being well controlled since medication changes made.   Past Medical History:  Diagnosis Date  . Diabetes mellitus    type II  . Hyperlipidemia    pt. states that hdl ldl was elevated denies hyperlipidemia  . Hypertension   . Hyperthyroidism    pt. denies having any thyroid issues  . Personal history of colonic polyps - adenomas 08/06/2013   07/2013 - 3 diminutive adenomas - repeat colonoscopy 2018  . Right hydrocele   . S/P ACL repair    right    Past Surgical History:  Procedure Laterality Date  . COLONOSCOPY    . KNEE SURGERY  2002   right  . POLYPECTOMY     Family History  Problem Relation Age of Onset  . Alcohol abuse Other   . Diabetes Other   . Hypertension Other   . Diabetes Mother   . Diabetes Father   . Diabetes Brother   . Diabetes Sister   . Colon cancer Neg Hx   . Rectal cancer Neg Hx   . Stomach cancer Neg Hx   . Esophageal  cancer Neg Hx   . Pancreatic cancer Neg Hx   . Colon polyps Neg Hx     Social History   Tobacco Use  . Smoking status: Never Smoker  . Smokeless tobacco: Never Used  Substance Use Topics  . Alcohol use: Yes    Alcohol/week: 2.0 standard drinks    Types: 2 Standard drinks or equivalent per week    Comment: occasionally   Marital Status: Married  ROS  Review of Systems  Cardiovascular: Negative for chest pain, dyspnea on exertion, palpitations and syncope.   Objective  Blood pressure (!) 149/83, pulse 69, height '5\' 8"'  (1.727 m), weight 65.3 kg, SpO2 99 %.  Vitals with BMI 12/27/2019 12/27/2019 07/24/2019  Height - '5\' 8"'  '5\' 8"'   Weight - 144 lbs 145 lbs  BMI - 00.7 12.19  Systolic 758 832 549  Diastolic 83 85 62  Pulse 69 61 91     Physical Exam Constitutional:      Comments: Well developed male in no acute distress.  HENT:     Head: Normocephalic and atraumatic.  Neck:     Vascular: No carotid bruit.  Cardiovascular:     Rate and Rhythm: Normal rate and regular rhythm.     Pulses:          Carotid pulses are 2+ on  the right side and 2+ on the left side.      Radial pulses are 2+ on the right side and 2+ on the left side.       Dorsalis pedis pulses are 2+ on the right side and 2+ on the left side.       Posterior tibial pulses are 2+ on the right side and 2+ on the left side.     Heart sounds: Murmur heard.  Systolic murmur is present with a grade of 2/6 at the apex.      Comments: No lower extremity edema. Pulmonary:     Effort: Pulmonary effort is normal. No respiratory distress.     Breath sounds: Normal breath sounds.  Neurological:     Mental Status: He is oriented to person, place, and time.    Laboratory examination:   No results for input(s): NA, K, CL, CO2, GLUCOSE, BUN, CREATININE, CALCIUM, GFRNONAA, GFRAA in the last 8760 hours. CrCl cannot be calculated (Patient's most recent lab result is older than the maximum 21 days allowed.).  CMP Latest Ref Rng &  Units 01/16/2018 01/10/2017 01/06/2016  Glucose 70 - 99 mg/dL 134(H) 121(H) 96  BUN 6 - 23 mg/dL '15 10 11  ' Creatinine 0.40 - 1.50 mg/dL 0.88 0.78 0.77  Sodium 135 - 145 mEq/L 139 138 138  Potassium 3.5 - 5.1 mEq/L 4.4 4.1 3.7  Chloride 96 - 112 mEq/L 102 100 101  CO2 19 - 32 mEq/L '29 31 28  ' Calcium 8.4 - 10.5 mg/dL 9.3 9.5 8.8  Total Protein 6.0 - 8.3 g/dL 7.3 7.2 6.8  Total Bilirubin 0.2 - 1.2 mg/dL 0.7 0.9 0.9  Alkaline Phos 39 - 117 U/L 68 65 64  AST 0 - 37 U/L '25 23 22  ' ALT 0 - 53 U/L '28 24 21   ' CBC Latest Ref Rng & Units 01/16/2018 01/10/2017 01/06/2016  WBC 4.0 - 10.5 K/uL 3.6(L) 5.8 5.1  Hemoglobin 13.0 - 17.0 g/dL 15.5 15.6 14.5  Hematocrit 39 - 52 % 44.2 45.9 41.6  Platelets 150 - 400 K/uL 136.0(L) 148.0(L) 146.0(L)    Lipid Panel No results for input(s): CHOL, TRIG, LDLCALC, VLDL, HDL, CHOLHDL, LDLDIRECT in the last 8760 hours.  HEMOGLOBIN A1C Lab Results  Component Value Date   HGBA1C 6.1 (A) 10/10/2018   TSH No results for input(s): TSH in the last 8760 hours.  External labs:   Labs 08/12/2019:  Serum glucose 187 mg, BUN 14, creatinine 0.9, EGFR 87/105 mill.  Sodium 139, potassium 4.5.  AST 50, ALT 86, elevated.  Hb 15.8/HCT 50.2, platelets 155.  TSH normal at 3.64.  Medications and allergies  No Known Allergies   Outpatient Medications Prior to Visit  Medication Sig Dispense Refill  . diltiazem (CARTIA XT) 180 MG 24 hr capsule Take 1 capsule (180 mg total) by mouth daily. 90 capsule 3  . ELIQUIS 5 MG TABS tablet TAKE ONE TABLET BY MOUTH TWICE A DAY 60 tablet 6  . JARDIANCE 25 MG TABS tablet TAKE 1 TABLET BY MOUTH EVERY DAY 90 tablet 1  . losartan (COZAAR) 100 MG tablet Take 1 tablet (100 mg total) by mouth at bedtime. 90 tablet 4  . metFORMIN (GLUCOPHAGE) 1000 MG tablet TAKE 1 TABLET BY MOUTH TWICE A DAY 180 tablet 3  . Multiple Vitamin (MULTIVITAMIN WITH MINERALS) TABS Take 1 tablet by mouth daily. Centrum    . OneTouch Delica Lancets 84X MISC Use to  check blood sugar 3 times a day. English  each 12  . ONETOUCH VERIO test strip USE AS INSTRUCTED TO CHECK BLOOD SUGAR THREE TIMES A DAY 100 strip 11  . rosuvastatin (CRESTOR) 10 MG tablet Take 10 mg by mouth at bedtime.    . sildenafil (REVATIO) 20 MG tablet Take 1 tablet (20 mg total) by mouth at bedtime as needed. 30 tablet 11  . TRULICITY 2.50 NL/9.7QB SOPN SMARTSIG:0.5 Milliliter(s) SUB-Q Once a Week    . glipiZIDE (GLUCOTROL XL) 5 MG 24 hr tablet Take 1 tablet daily with dinner 90 tablet 3  . glipiZIDE (GLUCOTROL) 5 MG tablet TAKE 1 TABLET BY MOUTH EVERY DAY WITH BREAKFAST 90 tablet 3  . JANUVIA 100 MG tablet Take 100 mg by mouth daily.    Marland Kitchen linagliptin (TRADJENTA) 5 MG TABS tablet Take 1 tablet (5 mg total) by mouth daily. (Patient not taking: Reported on 07/24/2019) 90 tablet 1  . RYBELSUS 14 MG TABS Take 1 tablet by mouth every morning.    . RYBELSUS 7 MG TABS Take 1 tablet by mouth at bedtime.     No facility-administered medications prior to visit.   Radiology:   No results found.  Cardiac Studies:   Sleep study 04/22/2019: Moderate degree of obstructive Sleep apnea with AHI 19.6/h, not  REM sleep accentuated and associated with snoring. Follows Dr. Brett Fairy.  Recommendations:  OSA is confirmed, associated with snoring. I recommend for atrial  fibrillation patients to use positive airway pressure therapy.  Without atrial fibrillation, a dental device could be considered  Lexiscan Sestamibi stress test 03/20/2019: No previous exam available for comparison. Lexiscan nuclear stress test performed using 1-day protocol. Myocardial perfusion imaging is normal. Left ventricular ejection fraction is 60% with normal wall motion. Low risk study.  Echocardiogram 122/05/2018: Normal LV systolic function with EF 61%. Left ventricle cavity is normal in size. Moderate concentric hypertrophy of the left ventricle. Normal global wall motion. Normal diastolic function. No definite mitral valve  prolapse. Mild (Grade I) mitral regurgitation. Mild tricuspid regurgitation. Estimated pulmonary artery systolic pressure is 34-19 mmHg, with estimated RA pressure 10-15 mmHg  Holter Monitor for 24 hours  03/07/2019:  Paroxysmal atrial fibrillation with rapid ventricular response, episode lasted at least for 4 hours during the daytime lasting from 1:17 PM through 6:27 PM, occasional PVCs.  No symptoms reported.  Total ventricular ectopics 230 beats.  Forbidden couplets. Maximum A. fib with RVR rate was 144 bpm.  EKG EKG 12/27/2019: Normal sinus rhythm at the rate of 58 bpm, left atrial enlargement, normal axis.  Incomplete right bundle branch block.  Nonspecific T abnormality.    EKG 03/07/2019: Normal sinus rhythm at 66 bpm, normal axis, IRBBB. Nonspecific T wave abnormality.   Assessment     ICD-10-CM   1. Paroxysmal atrial fibrillation (HCC)  I48.0 EKG 12-Lead  2. Primary hypertension  I10   3. OSA on CPAP  G47.33    Z99.89      Medications Discontinued During This Encounter  Medication Reason  . glipiZIDE (GLUCOTROL XL) 5 MG 24 hr tablet Completed Course  . glipiZIDE (GLUCOTROL) 5 MG tablet Completed Course  . JANUVIA 100 MG tablet Change in therapy  . RYBELSUS 14 MG TABS Completed Course  . RYBELSUS 7 MG TABS Completed Course  . linagliptin (TRADJENTA) 5 MG TABS tablet Completed Course  . ELIQUIS 5 MG TABS tablet Discontinued by provider    This patients CHA2DS2-VASc Score 2 (HTN, DM) and yearly risk of stroke 2.2%.  Recommendations:   William Nixon is a  59 y.o.  Male with type 2 diabetes, hypertension, hyperthyroidism, vasovagal syncope in Oct 2020. He had an episode similar to this approximately 2 years ago. Holter monitor had revealed paroxysmal episodes of atrial fibrillation with RVR incidentally.  He underwent sleep study and found to have severe obstructive sleep apnea and is presently on CPAP and has not had any recurrence.  He has been compliant with CPAP.    With regard to atrial fibrillation, as he is now well treated with CPAP for obstructive sleep apnea, and it was brief but paroxysmal and nonsustained episodes of A. fib noted incidentally, and his cardioembolic risk being 2.0 or less, we will discontinue Eliquis.  He is presently doing well, blood pressure is elevated but states that blood pressure has been very well controlled at the PCPs office.  We could consider changing losartan to olmesartan 40 mg daily and I will request Dr. Dagmar Hait to look into this. We will follow up in clinic in one year.  Blair Heys, PA Student 12/27/19 1:35 PM  Patient seen and examined in conjunction with Blair Heys, PA second year student at [employer].  Time spent is in direct patient face to face encounter not including the teaching and training involved.    Adrian Prows, MD, [facility] 12/28/2019, 10:06 AM Office: [phone]

## 2019-12-27 NOTE — Progress Notes (Deleted)
Primary Physician:  William Solian, MD   Patient ID: William Nixon, male    DOB: 21-Apr-1960, 59 y.o.   MRN: [id number]  Subjective:    Chief Complaint  Patient presents with  . Atrial Fibrillation  . Follow-up    HPI: William Nixon  is a 59 y.o. male male  with type 2 diabetes, hypertension, hyperthyroidism, vasovagal syncope in Oct 2020. He had an episode similar to this approximately 2 years ago, an event monitor had revealed paroxysmal episodes of atrial fibrillation with RVR.  He was started on anticoagulation, sleep study confirmed obstructive sleep apnea.  He is now compliant with CPAP, but still having some difficulty.  He has an appointment to follow-up with Dr. Roddie Nixon.  He does run 4-5 miles per day that he tolerates well. Still has occasional palpitations. No chest pian or dyspnea.  ***Presents for 6 month follow up on A fib and hypertension. At last visit switched losartan to evening. Records blood pressure at home and reports numbers in 120s/70s regularly.   Past Medical History:  Diagnosis Date  . Diabetes mellitus    type II  . Hyperlipidemia    pt. states that hdl ldl was elevated denies hyperlipidemia  . Hypertension   . Hyperthyroidism    pt. denies having any thyroid issues  . Personal history of colonic polyps - adenomas 08/06/2013   07/2013 - 3 diminutive adenomas - repeat colonoscopy 2018  . Right hydrocele   . S/P ACL repair    right     Past Surgical History:  Procedure Laterality Date  . COLONOSCOPY    . KNEE SURGERY  2002   right  . POLYPECTOMY     Social History   Tobacco Use  . Smoking status: Never Smoker  . Smokeless tobacco: Never Used  Substance Use Topics  . Alcohol use: Yes    Alcohol/week: 2.0 standard drinks    Types: 2 Standard drinks or equivalent per week    Comment: occasionally    Family History  Problem Relation Age of Onset  . Alcohol abuse Other   . Diabetes Other   . Hypertension Other   . Diabetes  Mother   . Diabetes Father   . Diabetes Brother   . Diabetes Sister   . Colon cancer Neg Hx   . Rectal cancer Neg Hx   . Stomach cancer Neg Hx   . Esophageal cancer Neg Hx   . Pancreatic cancer Neg Hx   . Colon polyps Neg Hx     Review of Systems  Cardiovascular: Positive for palpitations. Negative for chest pain, dyspnea on exertion and leg swelling.  Respiratory: Positive for snoring.   Gastrointestinal: Negative for melena.   Objective:  Blood pressure (!) 149/83, pulse 69, height _0  (1.727 m), weight 144 lb (65.3 kg), SpO2 99 %. Body mass index is 21.9 kg/m.    Physical Exam Vitals reviewed.  Constitutional:      Appearance: He is well-developed.  Cardiovascular:     Rate and Rhythm: Normal rate and regular rhythm. Frequent extrasystoles are present.    Pulses: Intact distal pulses.     Heart sounds: Murmur heard. High-pitched blowing mid to late systolic murmur is present with a grade of 2/6 at the apex.   Pulmonary:     Effort: Pulmonary effort is normal. No accessory muscle usage or respiratory distress.     Breath sounds: Normal breath sounds.  Abdominal:     General: Bowel sounds are  normal.     Palpations: Abdomen is soft.  Neurological:     Mental Status: He is oriented to person, place, and time.    Radiology: No results found.  Laboratory examination:   01/16/2019: Cholesterol 185, triglycerides 185, HDL 25, LDL 123.  Creatinine 0.8, EGFR 99/120, potassium 4.6, CMP normal.  CBC normal.  TSH elevated at 5.37.  Hemoglobin A1c 6.1%. CMP Latest Ref Rng & Units 01/16/2018 01/10/2017 01/06/2016  Glucose 70 - 99 mg/dL 134(H) 121(H) 96  BUN 6 - 23 mg/dL _0 Creatinine 0.40 - 1.50 mg/dL 0.88 0.78 0.77  Sodium 135 - 145 mEq/L 139 138 138  Potassium 3.5 - 5.1 mEq/L 4.4 4.1 3.7  Chloride 96 - 112 mEq/L 102 100 101  CO2 19 - 32 mEq/L _1 Calcium 8.4 - 10.5 mg/dL 9.3 9.5 8.8  Total Protein 6.0 - 8.3 g/dL 7.3 7.2 6.8  Total Bilirubin 0.2 - 1.2 mg/dL  0.7 0.9 0.9  Alkaline Phos 39 - 117 U/L 68 65 64  AST 0 - 37 U/L _2 ALT 0 - 53 U/L _3 CBC Latest Ref Rng & Units 01/16/2018 01/10/2017 01/06/2016  WBC 4.0 - 10.5 K/uL 3.6(L) 5.8 5.1  Hemoglobin 13.0 - 17.0 g/dL 15.5 15.6 14.5  Hematocrit 39 - 52 % 44.2 45.9 41.6  Platelets 150 - 400 K/uL 136.0(L) 148.0(L) 146.0(L)   Lipid Panel     Component Value Date/Time   CHOL 163 01/16/2018 1008   TRIG 165.0 (H) 01/16/2018 1008   HDL 27.00 (L) 01/16/2018 1008   CHOLHDL 6 01/16/2018 1008   VLDL 33.0 01/16/2018 1008   LDLCALC 103 (H) 01/16/2018 1008   LDLDIRECT 96.9 11/07/2012 0806   HEMOGLOBIN A1C Lab Results  Component Value Date   HGBA1C 6.1 (A) 10/10/2018   TSH No results for input(s): TSH in the last 8760 hours.  External labs: 08/12/2019:  BUN 14, Cr 0.90, TSH 3.64  Cholesterol, total 109.000 m 03/25/2019 HDL 29 MG/DL 03/25/2019 LDL 57.000 mg 03/25/2019 Triglycerides 114.000 03/25/2019  A1C 6.200 % 03/25/2019; TSH 5.660 03/01/2019  Hemoglobin 15.400 g/ 03/01/2019  Creatinine, Serum 0.800 mg/ 03/25/2019 Potassium 4.400 01/16/2018 ALT (SGPT) 25.000 uni 03/25/2019  Cardiac Studies:   Sleep study 04/22/2019: Moderate degree of obstructive Sleep apnea with AHI 19.6/h, not  REM sleep accentuated and associated with snoring. Follows Dr. Brett Nixon.  Recommendations:  OSA is confirmed, associated with snoring. I recommend for atrial  fibrillation patients to use positive airway pressure therapy.  Without atrial fibrillation, a dental device could be considered.   Lexiscan Sestamibi stress test 03/20/2019: No previous exam available for comparison. Lexiscan nuclear stress test performed using 1-day protocol. Myocardial perfusion imaging is normal. Left ventricular ejection fraction is  60% with normal wall motion. Low risk study.  Echocardiogram 122/05/2018: Normal LV systolic function with EF 61%. Left ventricle cavity is normal in size. Moderate concentric hypertrophy  of the left ventricle. Normal global wall motion. Normal diastolic function. No definite mitral valve prolapse. Mild (Grade I) mitral regurgitation. Mild tricuspid regurgitation. Estimated pulmonary artery systolic pressure is 91-66 mmHg, with estimated RA pressure 10-15 mmHg.   Holter Monitor for 24 hours  03/07/2019:  Paroxysmal atrial fibrillation with rapid ventricular response, episode lasted at least for 4 hours during the daytime lasting from 1:17 PM through 6:27 PM, occasional PVCs.  No symptoms reported.  Total ventricular ectopics 230 beats.  Forbidden couplets.  Maximum A. fib with RVR rate  was 144 bpm.  EKG:  ***EKG   EKG 03/07/2019: Normal sinus rhythm at 66 bpm, normal axis, IRBBB. Nonspecific T wave abnormality.   Assessment:     ICD-10-CM   1. Paroxysmal atrial fibrillation (HCC)  I48.0 EKG 12-Lead  2. Primary hypertension  I10   3. OSA on CPAP  G47.33    Z99.89      This patients CHA2DS2-VASc Score 2 (HTN, DM) and yearly risk of stroke 2.2%.   Recommendations:   William Nixon  is a 59 y.o. male  with type 2 diabetes, hypertension, hyperthyroidism, vasovagal syncope in Oct 2020. He had an episode similar to this approximately 2 years ago, an event monitor had revealed paroxysmal episodes of atrial fibrillation with RVR.  He was started on anticoagulation, sleep study confirmed obstructive sleep apnea.  He is now compliant with CPAP.  I reviewed his external data of labs, diabetes is well controlled, lipids are also under excellent control, now he is on CPAP, could consider discontinuation of anticoagulation as his cardioembolic risk is <5.0 in male and also know that the sleep apnea is treated.  Patient prefers to do this as well, understands the risks and benefits associated with this. I would wait until his sleep disorder is well treated prior to discontinuing eliquis.  His blood pressure was slightly limited with systolic blood pressure greater than 125 mmHg,  will switch his losartan to taking in the evening and will continue to monitor.  If blood pressure remains greater than 518 mmHg systolic, will consider addition of amlodipine either 2.5 or 5 mg daily.  I will see him back in 6 months or sooner if problems.  ***Consider stopping anticoagulation?

## 2019-12-31 ENCOUNTER — Other Ambulatory Visit: Payer: Self-pay

## 2019-12-31 ENCOUNTER — Telehealth: Payer: Self-pay | Admitting: Cardiology

## 2019-12-31 ENCOUNTER — Other Ambulatory Visit: Payer: BC Managed Care – PPO

## 2019-12-31 ENCOUNTER — Other Ambulatory Visit: Payer: Self-pay | Admitting: Cardiology

## 2019-12-31 DIAGNOSIS — I480 Paroxysmal atrial fibrillation: Secondary | ICD-10-CM | POA: Diagnosis not present

## 2019-12-31 NOTE — Telephone Encounter (Signed)
Set appt for 4:30pm.

## 2019-12-31 NOTE — Telephone Encounter (Signed)
Can you put patient on schedule today for Nurse visit monitor? Please and thank you. Order already in.

## 2019-12-31 NOTE — Telephone Encounter (Signed)
Patient called and wanted to wear  Extended monitor to exclude asymptomatic A. Fib prior to stopping Eliquis.  Will order Zio XT for 2 weeks.     ICD-10-CM   1. Paroxysmal atrial fibrillation ([place])  I48.0 LONG TERM MONITOR (3-14 DAYS)     Adrian Prows, MD, [facility] 12/31/2019, 12:41 PM Office: [phone]

## 2020-01-14 DIAGNOSIS — I480 Paroxysmal atrial fibrillation: Secondary | ICD-10-CM | POA: Diagnosis not present

## 2020-01-19 DIAGNOSIS — G4733 Obstructive sleep apnea (adult) (pediatric): Secondary | ICD-10-CM | POA: Diagnosis not present

## 2020-02-06 ENCOUNTER — Other Ambulatory Visit: Payer: Self-pay | Admitting: Internal Medicine

## 2020-02-11 DIAGNOSIS — E039 Hypothyroidism, unspecified: Secondary | ICD-10-CM | POA: Diagnosis not present

## 2020-02-11 DIAGNOSIS — Z125 Encounter for screening for malignant neoplasm of prostate: Secondary | ICD-10-CM | POA: Diagnosis not present

## 2020-02-11 DIAGNOSIS — E785 Hyperlipidemia, unspecified: Secondary | ICD-10-CM | POA: Diagnosis not present

## 2020-02-11 DIAGNOSIS — Z0000 Encounter for general adult medical examination without abnormal findings: Secondary | ICD-10-CM | POA: Diagnosis not present

## 2020-02-11 DIAGNOSIS — E119 Type 2 diabetes mellitus without complications: Secondary | ICD-10-CM | POA: Diagnosis not present

## 2020-02-18 DIAGNOSIS — R82998 Other abnormal findings in urine: Secondary | ICD-10-CM | POA: Diagnosis not present

## 2020-02-18 DIAGNOSIS — Z0000 Encounter for general adult medical examination without abnormal findings: Secondary | ICD-10-CM | POA: Diagnosis not present

## 2020-02-18 DIAGNOSIS — I480 Paroxysmal atrial fibrillation: Secondary | ICD-10-CM | POA: Diagnosis not present

## 2020-02-18 DIAGNOSIS — Z23 Encounter for immunization: Secondary | ICD-10-CM | POA: Diagnosis not present

## 2020-02-19 DIAGNOSIS — G4733 Obstructive sleep apnea (adult) (pediatric): Secondary | ICD-10-CM | POA: Diagnosis not present

## 2020-03-21 DIAGNOSIS — G4733 Obstructive sleep apnea (adult) (pediatric): Secondary | ICD-10-CM | POA: Diagnosis not present

## 2020-04-02 DIAGNOSIS — Z1212 Encounter for screening for malignant neoplasm of rectum: Secondary | ICD-10-CM | POA: Diagnosis not present

## 2020-04-08 ENCOUNTER — Ambulatory Visit (INDEPENDENT_AMBULATORY_CARE_PROVIDER_SITE_OTHER): Payer: BC Managed Care – PPO | Admitting: Internal Medicine

## 2020-04-08 ENCOUNTER — Encounter: Payer: Self-pay | Admitting: Internal Medicine

## 2020-04-08 ENCOUNTER — Other Ambulatory Visit: Payer: Self-pay

## 2020-04-08 VITALS — BP 130/82 | HR 59 | Ht 68.0 in | Wt 148.2 lb

## 2020-04-08 DIAGNOSIS — E119 Type 2 diabetes mellitus without complications: Secondary | ICD-10-CM | POA: Diagnosis not present

## 2020-04-08 DIAGNOSIS — E1169 Type 2 diabetes mellitus with other specified complication: Secondary | ICD-10-CM

## 2020-04-08 DIAGNOSIS — E785 Hyperlipidemia, unspecified: Secondary | ICD-10-CM

## 2020-04-08 LAB — POCT GLYCOSYLATED HEMOGLOBIN (HGB A1C): Hemoglobin A1C: 7 % — AB (ref 4.0–5.6)

## 2020-04-08 MED ORDER — GLIPIZIDE ER 5 MG PO TB24: 5.0000 mg | ORAL_TABLET | Freq: Every day | ORAL | 3 refills | Status: DC

## 2020-04-08 NOTE — Progress Notes (Signed)
Patient ID: William Nixon, male   DOB: 12-Jul-1960, 59 y.o.   MRN: [id number]  This visit occurred during the SARS-CoV-2 public health emergency.  Safety protocols were in place, including screening questions prior to the visit, additional usage of staff PPE, and extensive cleaning of exam room while observing appropriate contact time as indicated for disinfecting solutions.   HPI: William Nixon is a 59 y.o.-year-old male, returning for f/u DM2, dx in ~1998, non-insulin-dependent, well controlled, without long-term complications. Last visit 1 year and 6 months ago.  Since last visit, he had a syncopal episode in 11/VGo20 with loss of consciousness for 1 to 2 minutes he saw cardiology (Dr. Rosalia Hammers) and this was considered a vasovagal syncope.  He was then found to have paroxysmal A. Fib 2/2 moderate OSA (now on a CPAP). He was started on a blood thinner (now off), also on statin and diltiazem.  Review latest HbA1c levels: 02/14/2020: HbA1c 6.6% Lab Results  Component Value Date   HGBA1C 6.1 (A) 10/10/2018   HGBA1C 6.4 (A) 04/12/2018   HGBA1C 6.3 01/16/2018  During summer months, as he is more active, his HbA1c is usually lower.  He is on: Medicine  Breakfast Lunch Dinner  Metformin 1000 M  M  Glipizide 5   G -5   G XL -5    Jardiance 25 J/F     >> Januvia 100 mg  T    He also tried herbal remedies: - mustard seed daily >> helps a lot - bitter melon once a week.  He tells me that since last visit, he started to see his PCP for diabetes care.  Since then: He tried Rybelsus >> N/V/weight loss. Tried Trulicity >> N/V and Glu in the 200s. He feels that his sugar control deteriorated after stopping glipizide XL so he would like to start this back.  Pt checks his sugars 1-2 times a day: - am: 90-125 >> 92-115, 121 >> 94-127, 130 >> 98-140, 153 - 2h after b'fast: n/c  - before lunch: n/c >> 103-165 >> n/c - 2h after lunch: 160-165, 170 >> 135-179, 181 >> 160, 165 - before  dinner: 92-114 >> n/c - 2h after dinner: 142-179 >> 170-180 >> 162-172 >> n/c - bedtime: n/c - nighttime: n/c Lowest sugar was 80s >> 94 >> 98; he has hypoglycemia awareness in the 80s. Highest sugar was 180 >> 181 >> 165.  Glucometer: AccuChek (?Aviva)  His meals are mostly vegetarian.  He continues to run 4 to 5 miles almost every day and lifting weights twice a week.  She goes to the gym often works and has a late dinner, around 9 PM.  -No CKD, last BUN/creatinine:  02/11/2020: 12/0.8, GFR 99.3, glucose 129 08/12/2019: 14/0.9, glucose 187 Lab Results  Component Value Date   BUN 15 01/16/2018   CREATININE 0.88 01/16/2018  On Olmesartan 40 , prev. losartan 100 << 50.  -No microalbuminuria Lab Results  Component Value Date   MICRALBCREAT 2.1 01/16/2018   MICRALBCREAT 2.5 01/10/2017   MICRALBCREAT 2.1 01/06/2016   MICRALBCREAT 2.3 09/10/2014   MICRALBCREAT 0.0 04/30/2014   MICRALBCREAT 0.6 10/02/2013   MICRALBCREAT 1.3 05/29/2013   MICRALBCREAT 0.8 11/07/2012   MICRALBCREAT 0.7 11/02/2011   MICRALBCREAT 1.2 04/27/2011   -+ HL; last set of lipids: 02/11/2020: 130/104/28/81 03/22/2019: 109/114/29/57 Lab Results  Component Value Date   CHOL 163 01/16/2018   HDL 27.00 (L) 01/16/2018   LDLCALC 103 (H) 01/16/2018   LDLDIRECT 96.9 11/07/2012  TRIG 165.0 (H) 01/16/2018   CHOLHDL 6 01/16/2018  On Crestor 10. - last eye exam was in 05/2019: No DR reportedly (Dr. Deloria Lair).  -No numbness and tingling in his feet.  Also: 02/11/2020: TSH 3.44 TSH 3.64 and free T4 1.2, both normal, on 08/12/2019.  He started a new job in 06/2016.  ROS: Constitutional: no weight gain/no weight loss, no fatigue, no subjective hyperthermia, no subjective hypothermia Eyes: no blurry vision, no xerophthalmia ENT: no sore throat, no nodules palpated in neck, no dysphagia, no odynophagia, no hoarseness Cardiovascular: no CP/no SOB/no palpitations/no leg swelling Respiratory: no cough/no  SOB/no wheezing Gastrointestinal: no N/no V/no D/no C/no acid reflux Musculoskeletal: no muscle aches/no joint aches Skin: no rashes, no hair loss Neurological: no tremors/no numbness/no tingling/no dizziness  I reviewed pt's medications, allergies, PMH, social hx, family hx, and changes were documented in the history of present illness. Otherwise, unchanged from my initial visit note.  Past Medical History:  Diagnosis Date   Diabetes mellitus    type II   Hyperlipidemia    pt. states that hdl ldl was elevated denies hyperlipidemia   Hypertension    Hyperthyroidism    pt. denies having any thyroid issues   Personal history of colonic polyps - adenomas 08/06/2013   07/2013 - 3 diminutive adenomas - repeat colonoscopy 2018   Right hydrocele    S/P ACL repair    right    Past Surgical History:  Procedure Laterality Date   COLONOSCOPY     KNEE SURGERY  2002   right   POLYPECTOMY     History   Social History   Marital Status: Married    Spouse Name: N/A    Number of Children: 1   Occupational History   controller   Social History Main Topics   Smoking status: Never Smoker    Smokeless tobacco: Never Used   Alcohol Use:     1 drink(s) per week   Drug Use: No   Current Outpatient Medications on File Prior to Visit  Medication Sig Dispense Refill   diltiazem (CARTIA XT) 180 MG 24 hr capsule Take 1 capsule (180 mg total) by mouth daily. 90 capsule 3   JARDIANCE 25 MG TABS tablet TAKE 1 TABLET BY MOUTH EVERY DAY 90 tablet 1   losartan (COZAAR) 100 MG tablet Take 1 tablet (100 mg total) by mouth at bedtime. 90 tablet 4   metFORMIN (GLUCOPHAGE) 1000 MG tablet TAKE 1 TABLET BY MOUTH TWICE A DAY 180 tablet 0   Multiple Vitamin (MULTIVITAMIN WITH MINERALS) TABS Take 1 tablet by mouth daily. Centrum     OneTouch Delica Lancets 99991111 MISC Use to check blood sugar 3 times a day. 300 each 12   ONETOUCH VERIO test strip USE AS INSTRUCTED TO CHECK BLOOD SUGAR  THREE TIMES A DAY 100 strip 11   rosuvastatin (CRESTOR) 10 MG tablet Take 10 mg by mouth at bedtime.     sildenafil (REVATIO) 20 MG tablet Take 1 tablet (20 mg total) by mouth at bedtime as needed. 30 tablet 11   TRULICITY [id number] [id number] SOPN SMARTSIG:0.5 Milliliter(s) SUB-Q Once a Week     No current facility-administered medications on file prior to visit.   No Known Allergies Family History  Problem Relation Age of Onset   Alcohol abuse Other    Diabetes Other    Hypertension Other    Diabetes Mother    Diabetes Father    Diabetes Brother    Diabetes Sister  Colon cancer Neg Hx    Rectal cancer Neg Hx    Stomach cancer Neg Hx    Esophageal cancer Neg Hx    Pancreatic cancer Neg Hx    Colon polyps Neg Hx    PE: BP 130/82    Pulse (!) 59    Ht 5\' 8"  (1.727 m)    Wt 148 lb 3.2 oz (67.2 kg)    SpO2 99%    BMI 22.53 kg/m  Body mass index is 22.53 kg/m. Wt Readings from Last 3 Encounters:  04/08/20 148 lb 3.2 oz (67.2 kg)  12/27/19 144 lb (65.3 kg)  07/24/19 145 lb (65.8 kg)   Constitutional: normal weight, in NAD Eyes: PERRLA, EOMI, no exophthalmos ENT: moist mucous membranes, no thyromegaly, no cervical lymphadenopathy Cardiovascular: RRR, No MRG Respiratory: CTA B Gastrointestinal: abdomen soft, NT, ND, BS+ Musculoskeletal: no deformities, strength intact in all 4 Skin: moist, warm, no rashes Neurological: no tremor with outstretched hands, DTR normal in all 4  ASSESSMENT: 1. DM2, non-insulin-dependent, controlled, without complications - also FH of mild DM on mother's side  In the past, he was getting [place] and [place] from [place] - much cheaper.  2. HL  PLAN:  1. Patient with longstanding, well-controlled, type 1 diabetes, on oral antidiabetic regimen.  He is exercising regularly and eating a mostly vegetarian diet.  He keeps a healthy weight and he also started the statin since last visit.  He has a diagnosis of paroxysmal A. fib that was  made after our last visit he was started on diltiazem.  Latest HbA1c available for review was 6.1% last year. -At this visit, he mentions that sugars worsened in the last year, after being taken off glipizide and during trial of a GLP-1 (initially p.o., and then injectable).  He had nausea, vomiting, significantly decreased appetite, and weight loss on these and he had to come off.  His glipizide XL was not restarted yet and he feels that his diabetes was doing much better while on this medication.  We decided to restart this at today's visit.  Otherwise, we can continue his BP before he retired, Metformin, the low-dose of 20 urea before dinner, and also had his SGLT2 inhibitor. - I suggested to:  Patient Instructions   Please restart Glipizide XL 5 mg before b'fast:  Medicine  Breakfast Lunch Dinner  Metformin M  M  Glipizide   G -5  Glipizide XL G XL -5    Jardiance J    Januvia J     Please return in 4-6 months with your sugar log.   - we checked his HbA1c: 7.0% (higher) - advised to check sugars at different times of the day - 1x a day, rotating check times - advised for yearly eye exams >> he is UTD - return to clinic in 4-6 months    2. HL -Reviewed latest lipid panel from 01/2020: LDL slightly higher, above goal, triglycerides  at goal, HDL low -In the past, I suggested a statin, but he declined.  However, since our last visit, he started Crestor 10.  Philemon Kingdom, MD PhD [facility]

## 2020-04-08 NOTE — Patient Instructions (Addendum)
Please restart Glipizide XL 5 mg before b'fast: : Medicine  Breakfast Lunch Dinner  Metformin M  M  Glipizide   G -5  Glipizide XL G XL -5    Jardiance J    Januvia J     Please return in 4-6 months with your sugar log.

## 2020-05-01 ENCOUNTER — Other Ambulatory Visit: Payer: Self-pay | Admitting: Internal Medicine

## 2020-05-28 ENCOUNTER — Encounter (INDEPENDENT_AMBULATORY_CARE_PROVIDER_SITE_OTHER): Payer: BC Managed Care – PPO | Admitting: Ophthalmology

## 2020-05-28 ENCOUNTER — Other Ambulatory Visit: Payer: Self-pay | Admitting: Cardiology

## 2020-05-28 DIAGNOSIS — I10 Essential (primary) hypertension: Secondary | ICD-10-CM

## 2020-05-28 DIAGNOSIS — I480 Paroxysmal atrial fibrillation: Secondary | ICD-10-CM

## 2020-05-28 MED ORDER — DILTIAZEM HCL ER COATED BEADS 180 MG PO CP24: 180.0000 mg | ORAL_CAPSULE | Freq: Every day | ORAL | 3 refills | Status: DC

## 2020-06-09 ENCOUNTER — Encounter (INDEPENDENT_AMBULATORY_CARE_PROVIDER_SITE_OTHER): Payer: Self-pay | Admitting: Ophthalmology

## 2020-06-09 ENCOUNTER — Other Ambulatory Visit: Payer: Self-pay

## 2020-06-09 ENCOUNTER — Ambulatory Visit (INDEPENDENT_AMBULATORY_CARE_PROVIDER_SITE_OTHER): Payer: BC Managed Care – PPO | Admitting: Ophthalmology

## 2020-06-09 DIAGNOSIS — H43812 Vitreous degeneration, left eye: Secondary | ICD-10-CM | POA: Diagnosis not present

## 2020-06-09 DIAGNOSIS — E119 Type 2 diabetes mellitus without complications: Secondary | ICD-10-CM | POA: Diagnosis not present

## 2020-06-09 DIAGNOSIS — H43821 Vitreomacular adhesion, right eye: Secondary | ICD-10-CM | POA: Diagnosis not present

## 2020-06-09 NOTE — Assessment & Plan Note (Signed)
Physiologic OS 

## 2020-06-09 NOTE — Assessment & Plan Note (Signed)

## 2020-06-09 NOTE — Assessment & Plan Note (Signed)
Physiologic not pathologic OD

## 2020-06-09 NOTE — Progress Notes (Signed)
06/09/2020     CHIEF COMPLAINT Patient presents for Retina Follow Up (1 YR FU OU///Pt reports stable vision OU. Pt denies any new F/F, pain, or pressure OU. ///Last A1C: around 6.9 03/2020                           Last BS: 128 this AM  )   HISTORY OF PRESENT ILLNESS: William Nixon is a 60 y.o. male who presents to the clinic today for:   HPI    Retina Follow Up    Patient presents with  Diabetic Retinopathy.  In both eyes.  This started 1 year ago.  Duration of 1 year.  Since onset it is stable. Additional comments: 1 YR FU OU   Pt reports stable vision OU. Pt denies any new F/F, pain, or pressure OU.    Last A1C: around 6.9 03/2020                           Last BS: 128 this AM         Last edited by Donne Hazel on 06/09/2020  8:27 AM. (History)      Referring physician: Prince Solian, MD [place],  [place] [id number]  HISTORICAL INFORMATION:   Selected notes from the MEDICAL RECORD NUMBER    Lab Results  Component Value Date   HGBA1C 7.0 (A) 04/08/2020     CURRENT MEDICATIONS: No current outpatient medications on file. (Ophthalmic Drugs)   No current facility-administered medications for this visit. (Ophthalmic Drugs)   Current Outpatient Medications (Other)  Medication Sig  . diltiazem (CARTIA XT) 180 MG 24 hr capsule Take 1 capsule (180 mg total) by mouth daily.  Marland Kitchen glipiZIDE (GLUCOTROL XL) 5 MG 24 hr tablet Take 1 tablet (5 mg total) by mouth daily with breakfast.  . JARDIANCE 25 MG TABS tablet TAKE 1 TABLET BY MOUTH EVERY DAY  . losartan (COZAAR) 100 MG tablet Take 1 tablet (100 mg total) by mouth at bedtime.  . metFORMIN (GLUCOPHAGE) 1000 MG tablet TAKE 1 TABLET BY MOUTH TWICE A DAY  . Multiple Vitamin (MULTIVITAMIN WITH MINERALS) TABS Take 1 tablet by mouth daily. Centrum  . OneTouch Delica Lancets 93T MISC Use to check blood sugar 3 times a day.  Glory Rosebush VERIO test strip USE AS INSTRUCTED TO CHECK BLOOD SUGAR THREE TIMES A  DAY  . rosuvastatin (CRESTOR) 10 MG tablet Take 10 mg by mouth at bedtime.  . sildenafil (REVATIO) 20 MG tablet Take 1 tablet (20 mg total) by mouth at bedtime as needed.  . TRULICITY 7.01 XB/9.3JQ SOPN SMARTSIG:0.5 Milliliter(s) SUB-Q Once a Week   No current facility-administered medications for this visit. (Other)      REVIEW OF SYSTEMS:    ALLERGIES No Known Allergies  PAST MEDICAL HISTORY Past Medical History:  Diagnosis Date  . Diabetes mellitus    type II  . Hyperlipidemia    pt. states that hdl ldl was elevated denies hyperlipidemia  . Hypertension   . Hyperthyroidism    pt. denies having any thyroid issues  . Personal history of colonic polyps - adenomas 08/06/2013   07/2013 - 3 diminutive adenomas - repeat colonoscopy 2018  . Right hydrocele   . S/P ACL repair    right    Past Surgical History:  Procedure Laterality Date  . COLONOSCOPY    . KNEE SURGERY  2002  right  . POLYPECTOMY      FAMILY HISTORY Family History  Problem Relation Age of Onset  . Alcohol abuse Other   . Diabetes Other   . Hypertension Other   . Diabetes Mother   . Diabetes Father   . Diabetes Brother   . Diabetes Sister   . Colon cancer Neg Hx   . Rectal cancer Neg Hx   . Stomach cancer Neg Hx   . Esophageal cancer Neg Hx   . Pancreatic cancer Neg Hx   . Colon polyps Neg Hx     SOCIAL HISTORY Social History   Tobacco Use  . Smoking status: Never Smoker  . Smokeless tobacco: Never Used  Vaping Use  . Vaping Use: Never used  Substance Use Topics  . Alcohol use: Yes    Alcohol/week: 2.0 standard drinks    Types: 2 Standard drinks or equivalent per week    Comment: occasionally  . Drug use: No         OPHTHALMIC EXAM: Base Eye Exam    Visual Acuity (ETDRS)      Right Left   Dist [place] 20/20 -1 20/400   Dist ph [place]  20/40       Tonometry (Tonopen, 8:30 AM)      Right Left   Pressure 12 13       Pupils      Pupils Dark Light Shape React APD   Right PERRL 3  3 Round Brisk None   Left PERRL 3 3 Round Brisk None       Visual Fields (Counting fingers)      Left Right    Full Full       Extraocular Movement      Right Left    Full Full       Neuro/Psych    Oriented x3: Yes       Dilation    Both eyes: 1.0% Mydriacyl, 2.5% Phenylephrine @ 8:31 AM        Slit Lamp and Fundus Exam    External Exam      Right Left   External Normal Normal       Slit Lamp Exam      Right Left   Lids/Lashes Normal Normal   Conjunctiva/Sclera White and quiet White and quiet   Cornea Clear Clear   Anterior Chamber Deep and quiet Deep and quiet   Iris Round and reactive Round and reactive   Lens Nuclear sclerosis Nuclear sclerosis   Anterior Vitreous Normal Normal       Fundus Exam      Right Left   Posterior Vitreous Normal Normal   Disc Normal Normal   C/D Ratio 0.2 0.4   Macula Normal Normal   Vessels no DR no DR   Periphery Normal Normal          IMAGING AND PROCEDURES  Imaging and Procedures for 06/09/20  OCT, Retina - OU - Both Eyes       Right Eye Quality was good. Scan locations included subfoveal. Central Foveal Thickness: 292. Progression has been stable. Findings include vitreomacular adhesion , normal foveal contour.   Left Eye Quality was good. Scan locations included subfoveal. Central Foveal Thickness: 292. Progression has been stable.   Notes Incidental posterior vitreous detachment left eye, none pathologic VMA OD                ASSESSMENT/PLAN:  Vitreomacular adhesion of right eye Physiologic not pathologic OD  Posterior vitreous detachment of left eye Physiologic OS  Controlled type 2 diabetes mellitus without complication, without long-term current use of insulin ([place]) The patient has diabetes without any evidence of retinopathy. The patient advised to maintain good blood glucose control, excellent blood pressure control, and favorable levels of cholesterol, low density lipoprotein, and high  density lipoproteins. Follow up in 1 year was recommended. Explained that fluctuations in visual acuity , or "out of focus", may result from large variations of blood sugar control.      ICD-10-CM   1. Vitreomacular adhesion of right eye  H43.821 OCT, Retina - OU - Both Eyes  2. Posterior vitreous detachment of left eye  H43.812 OCT, Retina - OU - Both Eyes  3. Controlled type 2 diabetes mellitus without complication, without long-term current use of insulin (HCC)  E11.9     1.  No active disease OU.    2.  Continues on CPAP for OSA, no active maculopathy from untreated  OSA  3.  Ophthalmic Meds Ordered this visit:  No orders of the defined types were placed in this encounter.      Return in about 1 year (around 06/09/2021) for DILATE OU, OCT.  There are no Patient Instructions on file for this visit.   Explained the diagnoses, plan, and follow up with the patient and they expressed understanding.  Patient expressed understanding of the importance of proper follow up care.   Clent Demark Duaine Radin M.D. Diseases & Surgery of the Retina and Vitreous Retina [facility] Diabetic [place] 06/09/20     Abbreviations: M myopia (nearsighted); A astigmatism; H hyperopia (farsighted); P presbyopia; Mrx spectacle prescription;  CTL contact lenses; OD right eye; OS left eye; OU both eyes  XT exotropia; ET esotropia; PEK punctate epithelial keratitis; PEE punctate epithelial erosions; DES dry eye syndrome; MGD meibomian gland dysfunction; ATs artificial tears; PFAT's preservative free artificial tears; [place] nuclear sclerotic cataract; PSC posterior subcapsular cataract; ERM epi-retinal membrane; PVD posterior vitreous detachment; RD retinal detachment; DM diabetes mellitus; DR diabetic retinopathy; NPDR non-proliferative diabetic retinopathy; PDR proliferative diabetic retinopathy; CSME clinically significant macular edema; DME diabetic macular edema; dbh dot blot hemorrhages; CWS cotton wool spot; POAG  primary open angle glaucoma; C/D cup-to-disc ratio; HVF humphrey visual field; GVF goldmann visual field; OCT optical coherence tomography; IOP intraocular pressure; BRVO Branch retinal vein occlusion; CRVO central retinal vein occlusion; CRAO central retinal artery occlusion; BRAO branch retinal artery occlusion; RT retinal tear; SB scleral buckle; PPV pars plana vitrectomy; VH Vitreous hemorrhage; PRP panretinal laser photocoagulation; IVK intravitreal kenalog; VMT vitreomacular traction; MH Macular hole;  NVD neovascularization of the disc; NVE neovascularization elsewhere; AREDS age related eye disease study; ARMD age related macular degeneration; POAG primary open angle glaucoma; EBMD epithelial/anterior basement membrane dystrophy; ACIOL anterior chamber intraocular lens; IOL intraocular lens; PCIOL posterior chamber intraocular lens; Phaco/IOL phacoemulsification with intraocular lens placement; [place] photorefractive keratectomy; LASIK laser assisted in situ keratomileusis; HTN hypertension; DM diabetes mellitus; COPD chronic obstructive pulmonary disease

## 2020-06-11 ENCOUNTER — Encounter (INDEPENDENT_AMBULATORY_CARE_PROVIDER_SITE_OTHER): Payer: BC Managed Care – PPO | Admitting: Ophthalmology

## 2020-07-23 DIAGNOSIS — G4733 Obstructive sleep apnea (adult) (pediatric): Secondary | ICD-10-CM | POA: Diagnosis not present

## 2020-07-27 ENCOUNTER — Ambulatory Visit (INDEPENDENT_AMBULATORY_CARE_PROVIDER_SITE_OTHER): Payer: BC Managed Care – PPO | Admitting: Neurology

## 2020-07-27 ENCOUNTER — Encounter: Payer: Self-pay | Admitting: Neurology

## 2020-07-27 VITALS — BP 139/72 | HR 71 | Ht 68.0 in | Wt 154.4 lb

## 2020-07-27 DIAGNOSIS — E1169 Type 2 diabetes mellitus with other specified complication: Secondary | ICD-10-CM

## 2020-07-27 DIAGNOSIS — I480 Paroxysmal atrial fibrillation: Secondary | ICD-10-CM | POA: Diagnosis not present

## 2020-07-27 DIAGNOSIS — Z9989 Dependence on other enabling machines and devices: Secondary | ICD-10-CM | POA: Diagnosis not present

## 2020-07-27 DIAGNOSIS — G4733 Obstructive sleep apnea (adult) (pediatric): Secondary | ICD-10-CM | POA: Diagnosis not present

## 2020-07-27 DIAGNOSIS — E785 Hyperlipidemia, unspecified: Secondary | ICD-10-CM

## 2020-07-27 NOTE — Patient Instructions (Signed)

## 2020-07-27 NOTE — Progress Notes (Signed)
SLEEP MEDICINE CLINIC    Provider:  Larey Seat, MD  Primary Care Physician:  [place], [place] [address]     Referring Provider: Dr Einar Gip, MD Cardiologist         Chief Complaint according to patient   Patient presents with:    . New Patient (Initial Visit)     Dr. Einar Gip referred for sleep study- new onset atrial fibrillation, has 20 year- history of DM, has not had a sleep study in the past- snoring is present      HISTORY OF PRESENT ILLNESS:   07-27-2020; William Nixon,  is a 60  year- old Asian male patient of [place] descent , and seen in a RV on 07/27/2020. Dr Einar Gip referred for atrial fibrillation- no cardioversion, no ablation, not anticoagulation. He converted to sinus rhythm.  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  And he started on autotitrator today is his yearly revisit and he has been a 93% compliant with an 6-hour 18-minute average use the settings have not changed between 5 and 16 cmH2O with 3 cm EPR his residual AHI is very well controlled 0.7/h pressure at the 95th percentile is at 11.3 cmH2O so well covered with in his current parameters.  There is almost no air leakage noted indicating that his mask is a good seal there have been some medication changes he is off Trulicity now and has returned to glipizide his diabetes is treated by Dr. Crista Curb.avs His general physician is Dr. Herby Abraham his cardiologist is Dr. Einar Gip.  He endorsed the Epworth sleepiness score at 12 points out of 24 which is a little bit higher than last time and he reports that more than twice a week he will wake up between 3 and 4 in the morning and feeling difficulties to return to sleep for the rest of the night.  He has remained physically active, he is not obese, he remains on diltiazem Cardizem 180 mg by mouth once a day.      07-24-2019: William Nixon had undergone  a home sleep test which showed a moderate degree of sleep apnea of obstructive origin with an AHI of 19.6.  There was some snoring recorded but no REM sleep accentuation was noted.  I prescribed an auto CPAP device with a setting between 5 and 16 cmH2O pressure and 1 cm EPR.  In the meantime Dr. Arbie Cookey has tried multiple different interfaces and he had for a while also tried melatonin but has given ample melatonin.  His compliance for CPAP is excellent he has used it 87% of the time only 1 day less than 4 hours with an average usage time of 5 hours 6 minutes at night he still feels that he does not sleep through the night or if has still fragmented sleep.  The AutoSet has the pressure settings that I have quoted above and his residual AHI is 0.6/h so from an AHI of 19.6-0.6 is a very good resolution.  There is no Cheyne-Stokes respiration noted.  The 95th percentile pressure is at 8.9 cmH2O and the air leaks are really no the 95th percentile air leak is 1.1 L/min that is physiologically normal.  So we are meeting today to discuss not just the compliance of CPAP but also how we can increase sleep duration and decrease sleep fragmentation.  He works from home and  has gained an hour of morning sleep. He works in Engineer, mining.  No on call duties. 8-5 office hours.  Has been drinking tea and alcohol on occasion. 1 glass only at a time.      I have the pleasure of seeing William Nixon 04-02-2018, [name] with a possible sleep disorder.  He  has a past medical history of Diabetes mellitus, Hyperlipidemia, Hypertension, Hyperthyroidism, Personal history of colonic polyps - adenomas (08/06/2013), Right hydrocele, and S/P ACL repair.. New onset atrial fib discovered by Dr. Dagmar Hait- patient had a syncope, followed work up with normal EKG, and followed by cardiac monitoring ( atrial fib positive)  , had chemical induced stress test, echocardiogram were normal.      Sleep relevant medical  history: Nocturia only once,  No Tonsillectomy, no ENT or cervical injuries or surgeries.   Family medical /sleep history: No other family member is known to have OSA, but strong diabetes history.   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 Pets are present- one dog. Tobacco use never .  ETOH use; wine and single malt, 3/ week.  Caffeine intake in form of Coffee( none ) Soda( none Tea ( yes- almost daily) or energy drinks. Regular exercise in form of running, four miles a day and light weight lifting    Hobbies: cricket    Sleep habits are as follows: The patient's dinner time is between 8.45  PM. The patient goes to bed at 11 PM and continues to sleep for several hours, wakes for one bathroom break at 4 AM.   The preferred sleep position is laterally- with the support of 2 pillows. Dreams are reportedly frequent/ not vivid.  6.15 AM is the usual rise time- he can sleep a little later when working from home. The patient wakes up with an alarm. Let's it snooze once .  He reports  feeling quite refreshed / restored in AM, without symptoms such as dry mouth, morning headaches or  residual fatigue. He is keeping busy all the time.  Naps are taken frequently, lasting from 10 to 30 minutes and are more  refreshing than nocturnal sleep.    Review of Systems: Out of a complete 14 system review, the patient complains of only the following symptoms, and all other reviewed systems are negative.:   snoring- per wife.    How likely are you to doze in the following situations: 0 = not likely, 1 = slight chance, 2 = moderate chance, 3 = high chance   Sitting and Reading? Watching Television? Sitting inactive in a public place (theater or meeting)? As a passenger in a car for an hour without a break? Lying down in the afternoon when circumstances permit? Sitting and talking to someone? Sitting  quietly after lunch without alcohol? In a car, while stopped for a few minutes in traffic?   Total = 11/ 24 points   FSS endorsed at  13/ 63 points.   Social History   Socioeconomic History  . Marital status: Married    Spouse name: Not on file  . Number of children: 1  . Years of education: Not on file  . Highest education level: Not on file  Occupational History  . Occupation: Full time  Tobacco Use  . Smoking status: Never Smoker  . Smokeless tobacco: Never Used  Vaping Use  . Vaping Use:  Never used  Substance and Sexual Activity  . Alcohol use: Yes    Alcohol/week: 2.0 standard drinks    Types: 2 Standard drinks or equivalent per week    Comment: occasionally  . Drug use: No  . Sexual activity: Not on file  Other Topics Concern  . Not on file  Social History Narrative  . Not on file   Social Determinants of Health   Financial Resource Strain: Not on file  Food Insecurity: Not on file  Transportation Needs: Not on file  Physical Activity: Not on file  Stress: Not on file  Social Connections: Not on file    Family History  Problem Relation Age of Onset  . Alcohol abuse Other   . Diabetes Other   . Hypertension Other   . Diabetes Mother   . Diabetes Father   . Diabetes Brother   . Diabetes Sister   . Colon cancer Neg Hx   . Rectal cancer Neg Hx   . Stomach cancer Neg Hx   . Esophageal cancer Neg Hx   . Pancreatic cancer Neg Hx   . Colon polyps Neg Hx     Past Medical History:  Diagnosis Date  . Diabetes mellitus    type II  . Hyperlipidemia    pt. states that hdl ldl was elevated denies hyperlipidemia  . Hypertension   . Hyperthyroidism    pt. denies having any thyroid issues  . Personal history of colonic polyps - adenomas 08/06/2013   07/2013 - 3 diminutive adenomas - repeat colonoscopy 2018  . Right hydrocele   . S/P ACL repair    right     Past Surgical History:  Procedure Laterality Date  . COLONOSCOPY    . KNEE SURGERY  2002   right   . POLYPECTOMY       Current Outpatient Medications on File Prior to Visit  Medication Sig Dispense Refill  . diltiazem (CARTIA XT) 180 MG 24 hr capsule Take 1 capsule (180 mg total) by mouth daily. 90 capsule 3  . glipiZIDE (GLUCOTROL XL) 5 MG 24 hr tablet Take 1 tablet (5 mg total) by mouth daily with breakfast. 90 tablet 3  . JARDIANCE 25 MG TABS tablet TAKE 1 TABLET BY MOUTH EVERY DAY 90 tablet 1  . losartan (COZAAR) 100 MG tablet Take 1 tablet (100 mg total) by mouth at bedtime. 90 tablet 4  . metFORMIN (GLUCOPHAGE) 1000 MG tablet TAKE 1 TABLET BY MOUTH TWICE A DAY 180 tablet 3  . Multiple Vitamin (MULTIVITAMIN WITH MINERALS) TABS Take 1 tablet by mouth daily. Centrum    . OneTouch Delica Lancets 56L MISC Use to check blood sugar 3 times a day. 300 each 12  . ONETOUCH VERIO test strip USE AS INSTRUCTED TO CHECK BLOOD SUGAR THREE TIMES A DAY 100 strip 11  . rosuvastatin (CRESTOR) 10 MG tablet Take 10 mg by mouth at bedtime.    . sildenafil (REVATIO) 20 MG tablet Take 1 tablet (20 mg total) by mouth at bedtime as needed. 30 tablet 11  . TRULICITY 8.75 IE/3.3IR SOPN SMARTSIG:0.5 Milliliter(s) SUB-Q Once a Week     No current facility-administered medications on file prior to visit.    HBAic was 6.2  No Known Allergies  Physical exam:  Today's Vitals   07/27/20 0809  BP: 139/72  Pulse: 71  Weight: 154 lb 6.4 oz (70 kg)  Height: 5\' 8"  (1.727 m)   Body mass index is 23.48 kg/m.   Wt  Readings from Last 3 Encounters:  07/27/20 154 lb 6.4 oz (70 kg)  04/08/20 148 lb 3.2 oz (67.2 kg)  12/27/19 144 lb (65.3 kg)     Ht Readings from Last 3 Encounters:  07/27/20 5\' 8"  (1.727 m)  04/08/20 5\' 8"  (1.727 m)  12/27/19 5\' 8"  (1.727 m)      General: The patient is awake, alert and appears not in acute distress. The patient is well groomed. Head: Normocephalic, atraumatic. Neck is supple. Mallampati 2, elongated, soft uvula, no lateral narrowing.  neck circumference:14 inches. Nasal  airflow is patent.  Retrognathia is not seen.  Dental status: intact Cardiovascular:  irregular rate and cardiac rhythm by pulse,  without distended neck veins. Not palpitations but skips some beats. Respiratory: Lungs are clear to auscultation.  Skin:  Without evidence of ankle edema, or rash. Trunk: The patient's posture is erect.   Neurologic exam : The patient is awake and alert, oriented to place and time.   Memory subjective described as intact.  Attention span & concentration ability appears normal.  Speech is fluent,  without  dysarthria, dysphonia or aphasia.  Mood and affect are appropriate.   Cranial nerves: no loss of smell or taste reported  Pupils are equal and briskly reactive to light. Funduscopic exam deferred. .  Extraocular movements in vertical and horizontal planes were intact and without nystagmus. No Diplopia. Visual fields by finger perimetry are intact. Hearing was intact to soft voice and finger rubbing.    Facial sensation intact to fine touch.  Facial motor strength is symmetric and tongue and uvula move midline.  Neck ROM : rotation, tilt and flexion extension were normal for age and shoulder shrug was symmetrical.    Motor exam:  Symmetric bulk, tone and ROM.   Normal tone without cog wheeling, symmetric grip strength .   Sensory:  Fine touch, pinprick and vibration were tested  and  normal.  Proprioception tested in the upper extremities was normal.   Coordination:  Rapid alternating movements in the fingers/hands were of normal speed. .   Gait and station: Patient could rise unassisted from a seated position.  Deep tendon reflexes: in the  upper and lower extremities are symmetric and intact.  Babinski response was deferred.      After spending a total time of 18 minutes face to face and additional time for physical and neurologic examination, review of laboratory studies, personal review of imaging studies, reports and results of other testing and  review of referral information / records as far as provided in visit, I have established the following assessments:   I have the pleasure of meeting William Nixon today, he was diagnosed with OSA,   My Plan is to proceed with: use of CPAP as currently reached .   try Unisom or melatonin,    I would like to thank Adrian Prows, MD and [address] [place], [place],  [place] [id number] for allowing me to meet with and to take care of this pleasant patient.   In short, William Nixon is presenting with OSA, snoring in the setting of new onset atrial fib. , all of which can be controlled by CPAP  I plan to follow up either personally or through our NP within 2-3 month.    Electronically signed by: Larey Seat, MD 07/27/2020 8:39 AM  [facility] Board certified by The [employer] of Sleep Medicine and Diplomate of the [employer] of Sleep Medicine.  Board certified In Neurology through the [place], Fellow of the [employer] of Neurology. Medical Director of [facility].

## 2020-08-25 ENCOUNTER — Encounter: Payer: Self-pay | Admitting: Internal Medicine

## 2020-08-25 ENCOUNTER — Ambulatory Visit (INDEPENDENT_AMBULATORY_CARE_PROVIDER_SITE_OTHER): Payer: BC Managed Care – PPO | Admitting: Internal Medicine

## 2020-08-25 ENCOUNTER — Other Ambulatory Visit: Payer: Self-pay

## 2020-08-25 ENCOUNTER — Other Ambulatory Visit: Payer: Self-pay | Admitting: Internal Medicine

## 2020-08-25 VITALS — BP 140/64 | HR 68 | Ht 68.0 in | Wt 150.2 lb

## 2020-08-25 DIAGNOSIS — E1169 Type 2 diabetes mellitus with other specified complication: Secondary | ICD-10-CM | POA: Diagnosis not present

## 2020-08-25 DIAGNOSIS — E119 Type 2 diabetes mellitus without complications: Secondary | ICD-10-CM

## 2020-08-25 DIAGNOSIS — E785 Hyperlipidemia, unspecified: Secondary | ICD-10-CM | POA: Diagnosis not present

## 2020-08-25 LAB — POCT GLYCOSYLATED HEMOGLOBIN (HGB A1C): Hemoglobin A1C: 6.6 % — AB (ref 4.0–5.6)

## 2020-08-25 NOTE — Progress Notes (Signed)
Patient ID: William Nixon, male   DOB: [birth year], 60 y.o.   MRN: [id number]  This visit occurred during the SARS-CoV-2 public health emergency.  Safety protocols were in place, including screening questions prior to the visit, additional usage of staff PPE, and extensive cleaning of exam room while observing appropriate contact time as indicated for disinfecting solutions.   HPI: William Nixon is a 60 y.o.-year-old male, returning for f/u DM2, dx in ~1998, non-insulin-dependent, well controlled, without long-term complications. Last visit 5 months ago.  Interim history: He will change his job soon: working from home. No increased urination, blurry vision, nausea.  Reviewed latest HbA1c levels: Lab Results  Component Value Date   HGBA1C 7.0 (A) 04/08/2020   HGBA1C 6.1 (A) 10/10/2018   HGBA1C 6.4 (A) 04/12/2018  02/14/2020: HbA1c 6.6% During summer months, as he is more active, his HbA1c is usually lower.  He is on: Medicine  Breakfast Lunch Dinner  Metformin M  M  Glipizide   G -5  Glipizide XL G XL -5    Jardiance J    Januvia/Tradjenta J/T     He also tried herbal remedies: - mustard seed daily >> helps a lot - bitter melon once a week.  He tried Rybelsus >> N/V/weight loss. Tried Trulicity >> N/V and Glu in the 200s. He feels that his sugar control deteriorated after stopping glipizide XL before visit from 12/21 so we started this back.  Pt checks his sugars 1-2 times a day: - am: 92-115, 121 >> 94-127, 130 >> 98-140, 153 >> 103-139, 150 (b'day) - late dinner - 2h after b'fast: n/c  - before lunch: n/c >> 103-165 >> n/c - 2h after lunch: 135-179, 181 >> 160, 165 >> <170 - before dinner: 92-114 >> n/c - 2h after dinner: 170-180 >> 162-172 >> n/c >> <170 - bedtime: n/c - nighttime: n/c Lowest sugar was 80s >> 94 >> 98; he has hypoglycemia awareness in the 80s. Highest sugar was 180 >> 181 >> 165.  Glucometer: AccuChek (?[employer]iva)  His meals are mostly  vegetarian.  He continues to run 4 to 5 miles almost every day and lifting weights twice a week.  She goes to the gym often works and has a late dinner, around 9 PM.  -No CKD, last BUN/creatinine:  02/11/2020: 12/0.8, GFR 99.3, glucose 129 08/12/2019: 14/0.9, glucose 187 Lab Results  Component Value Date   BUN 15 01/16/2018   CREATININE 0.88 01/16/2018  On Olmesartan 40 , prev. losartan 100 << 50.  -No microalbuminuria Lab Results  Component Value Date   MICRALBCREAT 2.1 01/16/2018   MICRALBCREAT 2.5 01/10/2017   MICRALBCREAT 2.1 01/06/2016   MICRALBCREAT 2.3 09/10/2014   MICRALBCREAT 0.0 04/30/2014   MICRALBCREAT 0.6 10/02/2013   MICRALBCREAT 1.3 05/29/2013   MICRALBCREAT 0.8 11/07/2012   MICRALBCREAT 0.7 11/02/2011   MICRALBCREAT 1.2 04/27/2011   -+ HL; last set of lipids: 02/11/2020: 130/104/28/81 03/22/2019: 109/114/29/57 Lab Results  Component Value Date   CHOL 163 01/16/2018   HDL 27.00 (L) 01/16/2018   LDLCALC 103 (H) 01/16/2018   LDLDIRECT 96.9 11/07/2012   TRIG 165.0 (H) 01/16/2018   CHOLHDL 6 01/16/2018  On Crestor 10. - last eye exam was in 05/2020: no DR (Dr. Deloria Lair).  -No numbness and tingling in his feet.  Also: 02/11/2020: TSH 3.44 TSH 3.64 and free T4 1.2, both normal, on 08/12/2019.  He started a new job in 06/2016.  He had a syncopal episode in 02/2019 with loss of  consciousness for 1 to 2 minutes - he saw cardiology (Dr. Rosalia Hammers) and this was considered a vasovagal syncope.  He was then found to have paroxysmal A. Fib 2/2 moderate OSA (now on a CPAP). He was started on a blood thinner (now off), also on statin and diltiazem.  ROS: Constitutional: no weight gain/no weight loss, no fatigue, no subjective hyperthermia, no subjective hypothermia Eyes: no blurry vision, no xerophthalmia ENT: no sore throat, no nodules palpated in neck, no dysphagia, no odynophagia, no hoarseness Cardiovascular: no CP/no SOB/no palpitations/no leg  swelling Respiratory: no cough/no SOB/no wheezing Gastrointestinal: no N/no V/no D/no C/no acid reflux Musculoskeletal: no muscle aches/no joint aches Skin: no rashes, no hair loss Neurological: no tremors/no numbness/no tingling/no dizziness  I reviewed pt's medications, allergies, PMH, social hx, family hx, and changes were documented in the history of present illness. Otherwise, unchanged from my initial visit note.  Past Medical History:  Diagnosis Date  . Diabetes mellitus    type II  . Hyperlipidemia    pt. states that hdl ldl was elevated denies hyperlipidemia  . Hypertension   . Hyperthyroidism    pt. denies having any thyroid issues  . Personal history of colonic polyps - adenomas 08/06/2013   07/2013 - 3 diminutive adenomas - repeat colonoscopy 2018  . Right hydrocele   . S/P ACL repair    right    Past Surgical History:  Procedure Laterality Date  . COLONOSCOPY    . KNEE SURGERY  2002   right  . POLYPECTOMY     History   Social History  . Marital Status: Married    Spouse Name: N/A    Number of Children: 1   Occupational History  . controller   Social History Main Topics  . Smoking status: Never Smoker   . Smokeless tobacco: Never Used  . Alcohol Use:     1 drink(s) per week  . Drug Use: No   Current Outpatient Medications on File Prior to Visit  Medication Sig Dispense Refill  . diltiazem (CARTIA XT) 180 MG 24 hr capsule Take 1 capsule (180 mg total) by mouth daily. 90 capsule 3  . glipiZIDE (GLUCOTROL XL) 5 MG 24 hr tablet Take 1 tablet (5 mg total) by mouth daily with breakfast. 90 tablet 3  . JARDIANCE 25 MG TABS tablet TAKE 1 TABLET BY MOUTH EVERY DAY 90 tablet 1  . losartan (COZAAR) 100 MG tablet Take 1 tablet (100 mg total) by mouth at bedtime. 90 tablet 4  . metFORMIN (GLUCOPHAGE) 1000 MG tablet TAKE 1 TABLET BY MOUTH TWICE A DAY 180 tablet 3  . Multiple Vitamin (MULTIVITAMIN WITH MINERALS) TABS Take 1 tablet by mouth daily. Centrum    .  OneTouch Delica Lancets 16X MISC Use to check blood sugar 3 times a day. 300 each 12  . ONETOUCH VERIO test strip USE AS INSTRUCTED TO CHECK BLOOD SUGAR THREE TIMES A DAY 100 strip 11  . rosuvastatin (CRESTOR) 10 MG tablet Take 10 mg by mouth at bedtime.    . sildenafil (REVATIO) 20 MG tablet Take 1 tablet (20 mg total) by mouth at bedtime as needed. 30 tablet 11   No current facility-administered medications on file prior to visit.   No Known Allergies Family History  Problem Relation Age of Onset  . Alcohol abuse Other   . Diabetes Other   . Hypertension Other   . Diabetes Mother   . Diabetes Father   . Diabetes Brother   .  Diabetes Sister   . Colon cancer Neg Hx   . Rectal cancer Neg Hx   . Stomach cancer Neg Hx   . Esophageal cancer Neg Hx   . Pancreatic cancer Neg Hx   . Colon polyps Neg Hx    PE: BP 140/64 (BP Location: Right Arm, Patient Position: Sitting, Cuff Size: Normal)   Pulse 68   Ht 5\' 8"  (1.727 m)   Wt 150 lb 3.2 oz (68.1 kg)   SpO2 98%   BMI 22.84 kg/m  Body mass index is 22.84 kg/m. Wt Readings from Last 3 Encounters:  08/25/20 150 lb 3.2 oz (68.1 kg)  07/27/20 154 lb 6.4 oz (70 kg)  04/08/20 148 lb 3.2 oz (67.2 kg)   Constitutional: normal weight, in NAD Eyes: PERRLA, EOMI, no exophthalmos ENT: moist mucous membranes, no thyromegaly, no cervical lymphadenopathy Cardiovascular: RRR, No MRG Respiratory: CTA B Gastrointestinal: abdomen soft, NT, ND, BS+ Musculoskeletal: no deformities, strength intact in all 4 Skin: moist, warm, no rashes Neurological: no tremor with outstretched hands, DTR normal in all 4  ASSESSMENT: 1. DM2, non-insulin-dependent, controlled, without complications - also FH of mild DM on mother's side  In the past, he was getting [place] and [place] from [place] - much cheaper.  2. HL  PLAN:  1. Patient with longstanding, well-controlled, type 2 diabetes, on oral antidiabetic regimen with metformin, sulfonylurea, DPP 4  inhibitor and SGLT2 inhibitor, with now good control.  At last visit, he returns after a longer absence during which PCP tried to stop his sulfonylurea and to try him on the oral and then an injectable GLP-1 receptor agonist.  He had nausea, vomiting, significantly decreased appetite and weight loss and had to come off the GLP-1 receptor agonist.  His HbA1c increased to 7.0%.  At last visit, we restarted glipizide and continued the rest of the regimen. -He does a great job with exercise and also eats a mostly vegetarian diet. -At today's visit, sugars are mostly at goal with only few mild hyperglycemic exceptions.  No hypoglycemia.  He feels that his regimen is working well for him.  We will continue it for now. - I suggested to:  Patient Instructions   Please continue: Medicine  Breakfast Lunch Dinner  Metformin M  M  Glipizide   G -5  Glipizide XL G XL -5    Jardiance J    Tradjeta T     Please return in 6 months with your sugar log.    - we checked his HbA1c: 6.6% (better) - advised to check sugars at different times of the day - 1x a day, rotating check times - advised for yearly eye exams >> he is UTD - return to clinic in 6 months   2. HL -Reviewed his latest lipid panel from 01/2020: LDL slightly higher, above goal, triglycerides at goal, HDL low -He continues on Crestor 10 mg daily-no side effects  Philemon Kingdom, MD PhD [facility] Endocrinology

## 2020-08-25 NOTE — Patient Instructions (Addendum)
Please continue: Medicine  Breakfast Lunch Dinner  Metformin M  M  Glipizide   G -5  Glipizide XL G XL -5    Jardiance J    Tradjeta T     Please return in 6 months with your sugar log.

## 2020-09-08 ENCOUNTER — Ambulatory Visit: Payer: BC Managed Care – PPO | Admitting: Internal Medicine

## 2020-10-13 ENCOUNTER — Telehealth: Payer: Self-pay | Admitting: Internal Medicine

## 2020-10-13 NOTE — Telephone Encounter (Signed)
REFILL REQUEST -   Glipizide XL  PHARMACY -   [facility] [id number] - [place], [place] Phone:  [phone]  Fax:  [phone]

## 2020-10-15 ENCOUNTER — Telehealth: Payer: Self-pay | Admitting: Internal Medicine

## 2020-10-15 ENCOUNTER — Other Ambulatory Visit: Payer: Self-pay

## 2020-10-15 MED ORDER — GLIPIZIDE ER 5 MG PO TB24: 5.0000 mg | ORAL_TABLET | Freq: Every day | ORAL | 3 refills | Status: DC

## 2020-10-15 NOTE — Telephone Encounter (Signed)
Sent  meds in [employer]

## 2020-10-15 NOTE — Telephone Encounter (Signed)
Pt called again to request his Glipizide XL be refilled to the pharmacy below.  [facility] [id number] - [place], [place] Phone:  [phone]  Fax:  [phone]

## 2020-10-15 NOTE — Telephone Encounter (Signed)
error 

## 2021-01-07 ENCOUNTER — Ambulatory Visit: Payer: BC Managed Care – PPO | Admitting: Cardiology

## 2021-01-07 NOTE — Progress Notes (Signed)
No show

## 2021-03-23 ENCOUNTER — Ambulatory Visit: Payer: BC Managed Care – PPO | Admitting: Internal Medicine

## 2021-04-05 ENCOUNTER — Ambulatory Visit (INDEPENDENT_AMBULATORY_CARE_PROVIDER_SITE_OTHER): Payer: 59 | Admitting: Internal Medicine

## 2021-04-05 ENCOUNTER — Encounter: Payer: Self-pay | Admitting: Internal Medicine

## 2021-04-05 ENCOUNTER — Other Ambulatory Visit: Payer: Self-pay

## 2021-04-05 VITALS — BP 110/60 | HR 66 | Ht 68.0 in | Wt 152.6 lb

## 2021-04-05 DIAGNOSIS — E785 Hyperlipidemia, unspecified: Secondary | ICD-10-CM

## 2021-04-05 DIAGNOSIS — E1169 Type 2 diabetes mellitus with other specified complication: Secondary | ICD-10-CM

## 2021-04-05 DIAGNOSIS — E119 Type 2 diabetes mellitus without complications: Secondary | ICD-10-CM

## 2021-04-05 LAB — POCT GLYCOSYLATED HEMOGLOBIN (HGB A1C): Hemoglobin A1C: 7 % — AB (ref 4.0–5.6)

## 2021-04-05 MED ORDER — FREESTYLE LIBRE 3 SENSOR MISC: 1.0000 | 3 refills | Status: DC

## 2021-04-05 NOTE — Patient Instructions (Addendum)
Please change: Medicine  Breakfast Lunch Dinner  Metformin   2x M  Glipizide   G -5  Glipizide XL G XL -5    Jardiance J    Tradjeta T     Please return in 4-6 months with your sugar log.

## 2021-04-05 NOTE — Progress Notes (Signed)
Patient ID: William Nixon, male   DOB: 01-Dec-1960, 60 y.o.   MRN: [id number]  This visit occurred during the SARS-CoV-2 public health emergency.  Safety protocols were in place, including screening questions prior to the visit, additional usage of staff PPE, and extensive cleaning of exam room while observing appropriate contact time as indicated for disinfecting solutions.   HPI: William Nixon is a 60 y.o.-year-old male, returning for f/u DM2, dx in ~1998, non-insulin-dependent, well controlled, without long-term complications. Last visit 7 months ago.  Interim history: Since last visit, he changed his job-now working from home. No increased urination, blurry vision, nausea, chest pain. He continues to exercise consistently-runs 4 miles outside and he is lunch hour and then does 30 minutes of light weight lifting at the gym after work.  He started weight lifting after an HbA1c returned higher in 01/2021.  Reviewed latest HbA1c levels: 01/21/2021: HbA1c 7.1% Lab Results  Component Value Date   HGBA1C 6.6 (A) 08/25/2020   HGBA1C 7.0 (A) 04/08/2020   HGBA1C 6.1 (A) 10/10/2018  02/14/2020: HbA1c 6.6% During summer months, as he is more active, his HbA1c is usually lower.  He is on: Medicine  Breakfast Lunch Dinner  Metformin M  M  Glipizide   G -5  Glipizide XL G XL -5    Jardiance J    Januvia/Tradjenta J/T     He also tried herbal remedies: - mustard seed daily >> helps a lot - bitter melon once a week.  He tried Rybelsus >> N/V/weight loss. Tried Trulicity >> N/V and Glu in the 200s. He feels that his sugar control deteriorated after stopping glipizide XL before visit from 12/21 so we started this back.  Pt checks his sugars 1-2 times a day: - am: 98-140, 153 >> 103-139, 150 (b'day) - late dinner >> 108, 117-164 - 2h after b'fast: n/c  - before lunch: n/c >> 103-165 >> n/c - 2h after lunch: 135-179, 181 >> 160, 165 >> <170 >> 133-183 - before dinner: 92-114 >>  n/c - 2h after dinner: 170-180 >> 162-172 >> n/c >> <170 >> n/c - bedtime: n/c - nighttime: n/c Lowest sugar was 80s >> 94 >> 98 >> 108; he has hypoglycemia awareness in the 80s. Highest sugar was 180 >> 181 >> 165 >> 183.  Glucometer: AccuChek (?[employer])  His meals are mostly vegetarian.  He continues to run 4 to 5 miles almost every day and lifting weights twice a week.  She goes to the gym often works and has a late dinner, around 9 PM.  -No CKD, last BUN/creatinine:  01/21/2021: 17/1.0, GFT 76.5, glucose 169 02/11/2020: 12/0.8, GFR 99.3, glucose 129 08/12/2019: 14/0.9, glucose 187 Lab Results  Component Value Date   BUN 15 01/16/2018   CREATININE 0.88 01/16/2018  On Olmesartan 40 , prev. losartan 100 << 50.  -No microalbuminuria Lab Results  Component Value Date   MICRALBCREAT 2.1 01/16/2018   MICRALBCREAT 2.5 01/10/2017   MICRALBCREAT 2.1 01/06/2016   MICRALBCREAT 2.3 09/10/2014   MICRALBCREAT 0.0 04/30/2014   MICRALBCREAT 0.6 10/02/2013   MICRALBCREAT 1.3 05/29/2013   MICRALBCREAT 0.8 11/07/2012   MICRALBCREAT 0.7 11/02/2011   MICRALBCREAT 1.2 04/27/2011   -+ HL; last set of lipids: 01/21/2021: 124/174/24/65 02/11/2020: 130/104/28/81 03/22/2019: 109/114/29/57 Lab Results  Component Value Date   CHOL 163 01/16/2018   HDL 27.00 (L) 01/16/2018   LDLCALC 103 (H) 01/16/2018   LDLDIRECT 96.9 11/07/2012   TRIG 165.0 (H) 01/16/2018   CHOLHDL 6 01/16/2018  On Crestor 10.  - last eye exam was in 05/2020: no DR (Dr. Deloria Lair).   -No numbness and tingling in his feet.  Also: 01/21/2021: TSH 3.5 02/11/2020: TSH 3.44 TSH 3.64 and free T4 1.2, both normal, on 08/12/2019.  He started a new job in 06/2016.  He had a syncopal episode in 02/2019 with loss of consciousness for 1 to 2 minutes - he saw cardiology (Dr. Rosalia Hammers) and this was considered a vasovagal syncope.  He was then found to have paroxysmal A. Fib 2/2 moderate OSA (now on a CPAP). He was started on a  blood thinner (now off), also on statin and diltiazem.  ROS: + See HPI  I reviewed pt's medications, allergies, PMH, social hx, family hx, and changes were documented in the history of present illness. Otherwise, unchanged from my initial visit note.  Past Medical History:  Diagnosis Date   Diabetes mellitus    type II   Hyperlipidemia    pt. states that hdl ldl was elevated denies hyperlipidemia   Hypertension    Hyperthyroidism    pt. denies having any thyroid issues   Personal history of colonic polyps - adenomas 08/06/2013   07/2013 - 3 diminutive adenomas - repeat colonoscopy 2018   Right hydrocele    S/P ACL repair    right    Past Surgical History:  Procedure Laterality Date   COLONOSCOPY     KNEE SURGERY  2002   right   POLYPECTOMY     History   Social History   Marital Status: Married    Spouse Name: N/A    Number of Children: 1   Occupational History   controller   Social History Main Topics   Smoking status: Never Smoker    Smokeless tobacco: Never Used   Alcohol Use:     1 drink(s) per week   Drug Use: No   Current Outpatient Medications on File Prior to Visit  Medication Sig Dispense Refill   diltiazem (CARTIA XT) 180 MG 24 hr capsule Take 1 capsule (180 mg total) by mouth daily. 90 capsule 3   glipiZIDE (GLUCOTROL XL) 5 MG 24 hr tablet Take 1 tablet (5 mg total) by mouth daily with breakfast. 90 tablet 3   JARDIANCE 25 MG TABS tablet TAKE 1 TABLET BY MOUTH EVERY DAY 90 tablet 1   losartan (COZAAR) 100 MG tablet Take 1 tablet (100 mg total) by mouth at bedtime. 90 tablet 4   metFORMIN (GLUCOPHAGE) 1000 MG tablet TAKE 1 TABLET BY MOUTH TWICE A DAY 180 tablet 3   Multiple Vitamin (MULTIVITAMIN WITH MINERALS) TABS Take 1 tablet by mouth daily. Centrum     OneTouch Delica Lancets 95M MISC Use to check blood sugar 3 times a day. 300 each 12   ONETOUCH VERIO test strip TEST THREE TIMES A DAY 100 strip 11   rosuvastatin (CRESTOR) 10 MG tablet Take 10 mg by  mouth at bedtime.     sildenafil (REVATIO) 20 MG tablet Take 1 tablet (20 mg total) by mouth at bedtime as needed. 30 tablet 11   No current facility-administered medications on file prior to visit.   No Known Allergies Family History  Problem Relation Age of Onset   Alcohol abuse Other    Diabetes Other    Hypertension Other    Diabetes Mother    Diabetes Father    Diabetes Brother    Diabetes Sister    Colon cancer Neg Hx    Rectal  cancer Neg Hx    Stomach cancer Neg Hx    Esophageal cancer Neg Hx    Pancreatic cancer Neg Hx    Colon polyps Neg Hx    PE: BP 110/60 (BP Location: Right Arm, Patient Position: Sitting, Cuff Size: Normal)    Pulse 66    Ht 5\' 8"  (1.727 m)    Wt 152 lb 9.6 oz (69.2 kg)    SpO2 98%    BMI 23.20 kg/m   Wt Readings from Last 3 Encounters:  04/05/21 152 lb 9.6 oz (69.2 kg)  08/25/20 150 lb 3.2 oz (68.1 kg)  07/27/20 154 lb 6.4 oz (70 kg)   Constitutional: normal weight, in NAD Eyes: PERRLA, EOMI, no exophthalmos ENT: moist mucous membranes, no thyromegaly, no cervical lymphadenopathy Cardiovascular: RRR, No MRG Respiratory: CTA B Musculoskeletal: no deformities, strength intact in all 4 Skin: moist, warm, no rashes Neurological: no tremor with outstretched hands, DTR normal in all 4  ASSESSMENT: 1. DM2, non-insulin-dependent, controlled, without complications - also FH of mild DM on mother's side  In the past, he was getting [place] and [place] from [place] - much cheaper.  2. HL  PLAN:  1. Patient with longstanding, well-controlled, type 2 diabetes, on oral antidiabetic regimen with metformin, sulfonylurea, DPP 4 inhibitor and SGLT2 inhibitor.  PCP tried to stop his sulfonylurea last year and started an oral, then an injectable GLP-1 receptor agonist.  He had nausea, vomiting, significantly decreased appetite and weight loss and had to come off a GLP-1 receptor agonist.  HbA1c increased to 7%. At last visit, this was better, at 6.6%.   However, 2.5 months ago he had another HbA1c checked by PCP and this was higher, at 7.1%. -He continue to exercise consistently-also added weight training in the last 2 months; and eats mostly a vegetarian diet. -At today's visit, we reviewed his meter download.  Sugars appear to be slightly higher than before.  He does have late dinners, at approximately 9:30 PM but I explained that even with these, sugars should not be higher than 130 in the morning.  We discussed about options for treatment.  I would not want to increase his glipizide unless absolutely needed.  I did asked him to check some blood sugars after dinner before seeing if we need to increase glipizide.  For now, I suggested to move the entire metformin dose with dinner to see if sugars improve overnight and in the morning.  I advised him to let me know if he cannot tolerate the regular metformin so I can call in metformin extended release for him.  Insulin would be another option but I would like to avoid this if possible.  At next visit, however, I plan to check him for insulin the patient seen and antipancreatic autoimmunity. -At this visit I also suggested a freestyle libre 3 CGM, if covered by his insurance.  I explained that this can help identify possible culprits for high blood sugars. - I suggested to:  Patient Instructions  Please change: Medicine  Breakfast Lunch Dinner  Metformin   2x M  Glipizide   G -5  Glipizide XL G XL -5    Jardiance J    Tradjeta T     Please return in 4-6 months with your sugar log.    - we checked his HbA1c: 7.0% (slightly lower) - advised to check sugars at different times of the day - 1x a day, rotating check times - advised for yearly eye exams >>  he is UTD - return to clinic in 6 months  2. HL -Reviewed latest lipid panel from 01/2021: LDL at goal, but triglycerides high.  She did start to increase exercise and improve his diet even more afterwards -He continues on Crestor 10 mg daily  without side effects  Philemon Kingdom, MD PhD [facility] Endocrinology

## 2021-04-06 ENCOUNTER — Encounter: Payer: Self-pay | Admitting: Internal Medicine

## 2021-04-07 MED ORDER — FREESTYLE LIBRE 2 SENSOR MISC: 3 refills | Status: DC

## 2021-04-08 ENCOUNTER — Other Ambulatory Visit: Payer: Self-pay | Admitting: Internal Medicine

## 2021-05-27 ENCOUNTER — Other Ambulatory Visit: Payer: Self-pay | Admitting: Cardiology

## 2021-05-27 DIAGNOSIS — I10 Essential (primary) hypertension: Secondary | ICD-10-CM

## 2021-06-10 ENCOUNTER — Other Ambulatory Visit: Payer: Self-pay

## 2021-06-10 ENCOUNTER — Encounter (INDEPENDENT_AMBULATORY_CARE_PROVIDER_SITE_OTHER): Payer: Self-pay | Admitting: Ophthalmology

## 2021-06-10 ENCOUNTER — Ambulatory Visit (INDEPENDENT_AMBULATORY_CARE_PROVIDER_SITE_OTHER): Payer: 59 | Admitting: Ophthalmology

## 2021-06-10 DIAGNOSIS — H43821 Vitreomacular adhesion, right eye: Secondary | ICD-10-CM | POA: Diagnosis not present

## 2021-06-10 DIAGNOSIS — H43812 Vitreous degeneration, left eye: Secondary | ICD-10-CM

## 2021-06-10 DIAGNOSIS — E119 Type 2 diabetes mellitus without complications: Secondary | ICD-10-CM

## 2021-06-10 NOTE — Progress Notes (Signed)
06/10/2021     CHIEF COMPLAINT Patient presents for  Chief Complaint  Patient presents with   Retina Follow Up    Annual diabetic eye examination.  Patient reports blood sugars have been less well controlled now from the sixes most recently to the low sevens.  Informed the patient that that still very good for his age group  HISTORY OF PRESENT ILLNESS: William Nixon is a 61 y.o. male who presents to the clinic today for:   HPI     Retina Follow Up           Diagnosis: Other (Vitreomacular adhesion )   Laterality: right eye   Onset: 1 year ago         Comments   1 yr fu ou oct. Patient states vision is stable and unchanged since last visit. Denies any new floaters or FOL.       Last edited by Laurin Coder on 06/10/2021  8:28 AM.      Referring physician: Prince Solian, MD [place],  [place] [id number]  HISTORICAL INFORMATION:   Selected notes from the MEDICAL RECORD NUMBER    Lab Results  Component Value Date   HGBA1C 7.0 (A) 04/05/2021     CURRENT MEDICATIONS: No current outpatient medications on file. (Ophthalmic Drugs)   No current facility-administered medications for this visit. (Ophthalmic Drugs)   Current Outpatient Medications (Other)  Medication Sig   Continuous Blood Gluc Sensor (FREESTYLE LIBRE 2 SENSOR) MISC Use as instructed. Change every 14 days   diltiazem (CARDIZEM CD) 180 MG 24 hr capsule TAKE ONE CAPSULE BY MOUTH DAILY   glipiZIDE (GLUCOTROL XL) 5 MG 24 hr tablet Take 1 tablet (5 mg total) by mouth daily with breakfast.   JARDIANCE 25 MG TABS tablet TAKE 1 TABLET BY MOUTH EVERY DAY   losartan (COZAAR) 100 MG tablet Take 1 tablet (100 mg total) by mouth at bedtime.   metFORMIN (GLUCOPHAGE) 1000 MG tablet TAKE ONE TABLET BY MOUTH TWICE A DAY   Multiple Vitamin (MULTIVITAMIN WITH MINERALS) TABS Take 1 tablet by mouth daily. Centrum   OneTouch Delica Lancets 06T MISC Use to check blood sugar 3 times a day.    ONETOUCH VERIO test strip TEST THREE TIMES A DAY   rosuvastatin (CRESTOR) 10 MG tablet Take 10 mg by mouth at bedtime.   sildenafil (REVATIO) 20 MG tablet Take 1 tablet (20 mg total) by mouth at bedtime as needed.   TRADJENTA 5 MG TABS tablet Take 5 mg by mouth daily.   No current facility-administered medications for this visit. (Other)      REVIEW OF SYSTEMS:    ALLERGIES No Known Allergies  PAST MEDICAL HISTORY Past Medical History:  Diagnosis Date   Diabetes mellitus    type II   Hyperlipidemia    pt. states that hdl ldl was elevated denies hyperlipidemia   Hypertension    Hyperthyroidism    pt. denies having any thyroid issues   Personal history of colonic polyps - adenomas 08/06/2013   07/2013 - 3 diminutive adenomas - repeat colonoscopy 2018   Right hydrocele    S/P ACL repair    right    Past Surgical History:  Procedure Laterality Date   COLONOSCOPY     KNEE SURGERY  2002   right   POLYPECTOMY      FAMILY HISTORY Family History  Problem Relation Age of Onset   Alcohol abuse Other    Diabetes Other  Hypertension Other    Diabetes Mother    Diabetes Father    Diabetes Brother    Diabetes Sister    Colon cancer Neg Hx    Rectal cancer Neg Hx    Stomach cancer Neg Hx    Esophageal cancer Neg Hx    Pancreatic cancer Neg Hx    Colon polyps Neg Hx     SOCIAL HISTORY Social History   Tobacco Use   Smoking status: Never   Smokeless tobacco: Never  Vaping Use   Vaping Use: Never used  Substance Use Topics   Alcohol use: Yes    Alcohol/week: 2.0 standard drinks    Types: 2 Standard drinks or equivalent per week    Comment: occasionally   Drug use: No         OPHTHALMIC EXAM:  Base Eye Exam     Visual Acuity (ETDRS)       Right Left   Dist [place] 20/20 20/200   Dist ph [place]  20/30         Tonometry (Tonopen, 8:32 AM)       Right Left   Pressure 17 15         Pupils       Pupils Dark Light Shape React APD   Right PERRL 3 3  Round Minimal None   Left PERRL 3 3 Round Minimal None         Visual Fields (Counting fingers)       Left Right    Full Full         Extraocular Movement       Right Left    Full Full         Neuro/Psych     Oriented x3: Yes   Mood/Affect: Normal         Dilation     Both eyes: 1.0% Mydriacyl, 2.5% Phenylephrine @ 8:32 AM           Slit Lamp and Fundus Exam     External Exam       Right Left   External Normal Normal         Slit Lamp Exam       Right Left   Lids/Lashes Normal Normal   Conjunctiva/Sclera White and quiet White and quiet   Cornea Clear Clear   Anterior Chamber Deep and quiet Deep and quiet   Iris Round and reactive Round and reactive   Lens Nuclear sclerosis Nuclear sclerosis   Anterior Vitreous Normal Normal         Fundus Exam       Right Left   Posterior Vitreous Normal Posterior vitreous detachment   Disc Normal Normal   C/D Ratio 0.2 0.4   Macula Normal Normal   Vessels no DR no DR   Periphery Normal Normal            IMAGING AND PROCEDURES  Imaging and Procedures for 06/10/21  OCT, Retina - OU - Both Eyes       Right Eye Quality was good. Scan locations included subfoveal. Central Foveal Thickness: 293. Progression has been stable. Findings include vitreomacular adhesion , normal foveal contour.   Left Eye Quality was good. Scan locations included subfoveal. Central Foveal Thickness: 292. Progression has been stable.   Notes Incidental posterior vitreous detachment left eye, none pathologic VMA OD, with pvd OS             ASSESSMENT/PLAN:  Controlled type 2 diabetes mellitus  without complication, without long-term current use of insulin ([place]) No DR again now for the 8-year.  Observe  Posterior vitreous detachment of left eye Physiologic, no pathology  Vitreomacular adhesion of right eye Physiologic OD, no pathology     ICD-10-CM   1. Vitreomacular adhesion of right eye  H43.821 OCT,  Retina - OU - Both Eyes    2. Controlled type 2 diabetes mellitus without complication, without long-term current use of insulin (HCC)  E11.9     3. Posterior vitreous detachment of left eye  H43.812       1.  No detectable diabetic retinopathy  2.  Observe  3.  Ophthalmic Meds Ordered this visit:  No orders of the defined types were placed in this encounter.      Return in about 1 year (around 06/10/2022) for DILATE OU, COLOR FP, OCT.  There are no Patient Instructions on file for this visit.   Explained the diagnoses, plan, and follow up with the patient and they expressed understanding.  Patient expressed understanding of the importance of proper follow up care.   Clent Demark Marshea Wisher M.D. Diseases & Surgery of the Retina and Vitreous Retina & Diabetic [place] 06/10/21     Abbreviations: M myopia (nearsighted); A astigmatism; H hyperopia (farsighted); P presbyopia; Mrx spectacle prescription;  CTL contact lenses; OD right eye; OS left eye; OU both eyes  XT exotropia; ET esotropia; PEK punctate epithelial keratitis; PEE punctate epithelial erosions; DES dry eye syndrome; MGD meibomian gland dysfunction; ATs artificial tears; PFAT's preservative free artificial tears; [place] nuclear sclerotic cataract; PSC posterior subcapsular cataract; ERM epi-retinal membrane; PVD posterior vitreous detachment; RD retinal detachment; DM diabetes mellitus; DR diabetic retinopathy; NPDR non-proliferative diabetic retinopathy; PDR proliferative diabetic retinopathy; CSME clinically significant macular edema; DME diabetic macular edema; dbh dot blot hemorrhages; CWS cotton wool spot; POAG primary open angle glaucoma; C/D cup-to-disc ratio; HVF humphrey visual field; GVF goldmann visual field; OCT optical coherence tomography; IOP intraocular pressure; BRVO Branch retinal vein occlusion; CRVO central retinal vein occlusion; CRAO central retinal artery occlusion; BRAO branch retinal artery occlusion; RT  retinal tear; SB scleral buckle; PPV pars plana vitrectomy; VH Vitreous hemorrhage; PRP panretinal laser photocoagulation; IVK intravitreal kenalog; VMT vitreomacular traction; MH Macular hole;  NVD neovascularization of the disc; NVE neovascularization elsewhere; AREDS age related eye disease study; ARMD age related macular degeneration; POAG primary open angle glaucoma; EBMD epithelial/anterior basement membrane dystrophy; ACIOL anterior chamber intraocular lens; IOL intraocular lens; PCIOL posterior chamber intraocular lens; Phaco/IOL phacoemulsification with intraocular lens placement; [place] photorefractive keratectomy; LASIK laser assisted in situ keratomileusis; HTN hypertension; DM diabetes mellitus; COPD chronic obstructive pulmonary disease

## 2021-06-10 NOTE — Assessment & Plan Note (Signed)
Physiologic, no pathology

## 2021-06-10 NOTE — Assessment & Plan Note (Signed)
Physiologic OD, no pathology

## 2021-06-10 NOTE — Assessment & Plan Note (Signed)
No DR again now for the 8-year.  Observe

## 2021-07-15 ENCOUNTER — Other Ambulatory Visit: Payer: Self-pay | Admitting: Cardiology

## 2021-07-15 ENCOUNTER — Emergency Department (HOSPITAL_COMMUNITY): Payer: 59

## 2021-07-15 ENCOUNTER — Encounter (HOSPITAL_COMMUNITY): Payer: Self-pay

## 2021-07-15 ENCOUNTER — Observation Stay (HOSPITAL_COMMUNITY)
Admission: EM | Admit: 2021-07-15 | Discharge: 2021-07-15 | Disposition: A | Discharge status: Home / Self Care | Payer: 59 | Attending: Internal Medicine | Admitting: Internal Medicine

## 2021-07-15 DIAGNOSIS — I480 Paroxysmal atrial fibrillation: Secondary | ICD-10-CM

## 2021-07-15 DIAGNOSIS — I10 Essential (primary) hypertension: Secondary | ICD-10-CM | POA: Diagnosis not present

## 2021-07-15 DIAGNOSIS — R55 Syncope and collapse: Secondary | ICD-10-CM | POA: Diagnosis present

## 2021-07-15 DIAGNOSIS — I959 Hypotension, unspecified: Secondary | ICD-10-CM | POA: Diagnosis not present

## 2021-07-15 DIAGNOSIS — I4891 Unspecified atrial fibrillation: Secondary | ICD-10-CM | POA: Insufficient documentation

## 2021-07-15 DIAGNOSIS — Z7984 Long term (current) use of oral hypoglycemic drugs: Secondary | ICD-10-CM | POA: Diagnosis not present

## 2021-07-15 DIAGNOSIS — E119 Type 2 diabetes mellitus without complications: Secondary | ICD-10-CM | POA: Insufficient documentation

## 2021-07-15 DIAGNOSIS — Z79899 Other long term (current) drug therapy: Secondary | ICD-10-CM | POA: Insufficient documentation

## 2021-07-15 DIAGNOSIS — Z7901 Long term (current) use of anticoagulants: Secondary | ICD-10-CM | POA: Diagnosis not present

## 2021-07-15 LAB — BASIC METABOLIC PANEL
Anion gap: 10 (ref 5–15)
BUN: 14 mg/dL (ref 6–20)
CO2: 22 mmol/L (ref 22–32)
Calcium: 8.7 mg/dL — ABNORMAL LOW (ref 8.9–10.3)
Chloride: 105 mmol/L (ref 98–111)
Creatinine, Ser: 0.9 mg/dL (ref 0.61–1.24)
GFR, Estimated: >60 mL/min (ref >60)
Glucose, Bld: 228 mg/dL — ABNORMAL HIGH (ref 70–99)
Potassium: 3.6 mmol/L (ref 3.5–5.1)
Sodium: 137 mmol/L (ref 135–145)

## 2021-07-15 LAB — CBC
HCT: 42.9 % (ref 39.0–52.0)
Hemoglobin: 14.3 g/dL (ref 13.0–17.0)
MCH: 30.7 pg (ref 26.0–34.0)
MCHC: 33.3 g/dL (ref 30.0–36.0)
MCV: 92.1 fL (ref 80.0–100.0)
Platelets: 279 10*3/uL (ref 150–400)
RBC: 4.66 MIL/uL (ref 4.22–5.81)
RDW: 12.7 % (ref 11.5–15.5)
WBC: 10.3 10*3/uL (ref 4.0–10.5)
nRBC: 0 % (ref 0.0–0.2)

## 2021-07-15 LAB — URINALYSIS, MICROSCOPIC (REFLEX)

## 2021-07-15 LAB — URINALYSIS, ROUTINE W REFLEX MICROSCOPIC
Bilirubin Urine: NEGATIVE
Glucose, UA: >=500 mg/dL — AB
Hgb urine dipstick: NEGATIVE
Ketones, ur: NEGATIVE mg/dL
Leukocytes,Ua: NEGATIVE
Nitrite: NEGATIVE
Protein, ur: NEGATIVE mg/dL
Specific Gravity, Urine: 1.01 (ref 1.005–1.030)
pH: 6 (ref 5.0–8.0)

## 2021-07-15 LAB — TROPONIN I (HIGH SENSITIVITY)
Troponin I (High Sensitivity): 8 ng/L (ref <18)
Troponin I (High Sensitivity): 9 ng/L (ref <18)

## 2021-07-15 LAB — MAGNESIUM: Magnesium: 1.9 mg/dL (ref 1.7–2.4)

## 2021-07-15 LAB — D-DIMER, QUANTITATIVE: D-Dimer, Quant: 0.71 ug/mL-FEU — ABNORMAL HIGH (ref 0.00–0.50)

## 2021-07-15 LAB — CBG MONITORING, ED: Glucose-Capillary: 215 mg/dL — ABNORMAL HIGH (ref 70–99)

## 2021-07-15 MED ORDER — SODIUM CHLORIDE 0.9 % IV BOLUS
1000.0000 mL | Freq: Once | INTRAVENOUS | Status: AC
  Administered 2021-07-15: 1000 mL via INTRAVENOUS

## 2021-07-15 MED ORDER — IOHEXOL 350 MG/ML SOLN
100.0000 mL | Freq: Once | INTRAVENOUS | Status: AC | PRN
  Administered 2021-07-15: 100 mL via INTRAVENOUS

## 2021-07-15 MED ORDER — APIXABAN 5 MG PO TABS: 5.0000 mg | ORAL_TABLET | Freq: Two times a day (BID) | ORAL | Status: DC

## 2021-07-15 MED ORDER — APIXABAN 5 MG PO TABS: 5.0000 mg | ORAL_TABLET | Freq: Two times a day (BID) | ORAL | 1 refills | Status: DC

## 2021-07-15 NOTE — ED Notes (Signed)
Pt verbalized understanding of d/c instructions, meds, and followup care. Denies questions. VSS, no distress noted. Steady gait to exit with all belongings.  ?

## 2021-07-15 NOTE — ED Triage Notes (Signed)
Pt BIB [facility] for eval of syncopal episode in the bathroom while at work. EMS reports pt was able to ambulate and became responsive after initial syncopal episode. EMS reports that during transit, his HR dropped to the 40s, hypotensive to the 15X systolic and became diaphoretic, EMS initiated an epi drip en route, which was stopped on arrival to ED d/t BP improvement to 123/50 ?

## 2021-07-15 NOTE — Discharge Instructions (Addendum)
Contact a health care provider if you: ?Vomit. ?Have diarrhea. ?Have a fever for more than 2-3 days. ?Feel more thirsty than usual. ?Feel weak and tired. ?Get help right away if you: ?Have chest pain. ?Have a fast or irregular heartbeat. ?Develop numbness in any part of your body. ?Cannot move your arms or your legs. ?Have trouble speaking. ?Become sweaty or feel light-headed. ?Faint. ?Feel short of breath. ?Have trouble staying awake. ?Feel confused. ?

## 2021-07-15 NOTE — ED Notes (Signed)
PA Harris made aware of BP ?

## 2021-07-15 NOTE — ED Notes (Signed)
Pt provided with a urinal for urine sample  ?

## 2021-07-15 NOTE — Consult Note (Addendum)
CARDIOLOGY CONSULT NOTE  ?Patient ID: ?William Nixon ?MRN: [id number] ?DOB/AGE: 60-Jan-1962 60 y.o. ? ?Admit date: 07/15/2021 ?Referring Physician: Zacarias Pontes, ER ?Reason for Consultation: A-fib, syncope ? ?HPI:  ? ?61 y.o. William Nixon male  with type 2 diabetes mellitus, paroxysmal atrial fibrillation, OSA on CPAP, presented with syncope, A-fib. ? ?Patient was last seen by Dr. Einar Gip over a year ago.  He has previously had vasovagal syncope, paroxysmal A-fib, that resolved after starting OSA treatment with CPAP.  Today, patient woke up this morning, admitted to bathroom to precious teeth.  He felt lightheaded, sat down on a bench.  After few minutes, he stood up and briefly lost consciousness did not have any major injuries.  His wife called EMS, EKGs performed by them showed A-fib.  Reportedly, patient was bradycardic in the ER, patient was hypertensive, as well as 79/55 mmHg.  It took 3 L to get his blood pressure back to normal.  Patient is no longer lightheaded at this time.  Work-up also showed mildly elevated D-dimer.  CT angiogram showed no PE. ? ?At baseline, patient plans for mild today.  He takes Jardiance 25 mg daily for diabetes, but admits drinking enough water every day.  He is also on olmesartan 40 mg daily.  His blood pressures generally run SBP 110 mmHg. ? ?Past Medical History:  ?Diagnosis Date  ? Diabetes mellitus   ? type II  ? Hyperlipidemia   ? pt. states that hdl ldl was elevated denies hyperlipidemia  ? Hypertension   ? Hyperthyroidism   ? pt. denies having any thyroid issues  ? Personal history of colonic polyps - adenomas 08/06/2013  ? 07/2013 - 3 diminutive adenomas - repeat colonoscopy 2018  ? Right hydrocele   ? S/P ACL repair   ? right   ?  ? ?Past Surgical History:  ?Procedure Laterality Date  ? COLONOSCOPY    ? KNEE SURGERY  2002  ? right  ? POLYPECTOMY    ?  ? ? ?Family History  ?Problem Relation Age of Onset  ? Alcohol abuse Other   ? Diabetes Other   ? Hypertension Other   ?  Diabetes Mother   ? Diabetes Father   ? Diabetes Brother   ? Diabetes Sister   ? Colon cancer Neg Hx   ? Rectal cancer Neg Hx   ? Stomach cancer Neg Hx   ? Esophageal cancer Neg Hx   ? Pancreatic cancer Neg Hx   ? Colon polyps Neg Hx   ?  ? ?Social History: ?Social History  ? ?Socioeconomic History  ? Marital status: Married  ?  Spouse name: Not on file  ? Number of children: 1  ? Years of education: Not on file  ? Highest education level: Not on file  ?Occupational History  ? Occupation: Full time  ?Tobacco Use  ? Smoking status: Never  ? Smokeless tobacco: Never  ?Vaping Use  ? Vaping Use: Never used  ?Substance and Sexual Activity  ? Alcohol use: Yes  ?  Alcohol/week: 2.0 standard drinks  ?  Types: 2 Standard drinks or equivalent per week  ?  Comment: occasionally  ? Drug use: No  ? Sexual activity: Not on file  ?Other Topics Concern  ? Not on file  ?Social History Narrative  ? Not on file  ? ?Social Determinants of Health  ? ?Financial Resource Strain: Not on file  ?Food Insecurity: Not on file  ?Transportation Needs: Not on file  ?Physical Activity:  Not on file  ?Stress: Not on file  ?Social Connections: Not on file  ?Intimate Partner Violence: Not on file  ?  ? ?(Not in a hospital admission) ? ? ?Review of Systems  ?Cardiovascular:  Positive for syncope. Negative for chest pain, dyspnea on exertion, leg swelling and palpitations.  ?Neurological:  Positive for light-headedness (Now resolved).  ?  ? ?Physical Exam: ?Physical Exam ?Vitals and nursing note reviewed.  ?Constitutional:   ?   General: He is not in acute distress. ?Neck:  ?   Vascular: No JVD.  ?Cardiovascular:  ?   Rate and Rhythm: Normal rate and regular rhythm.  ?   Heart sounds: Normal heart sounds. No murmur heard. ?Pulmonary:  ?   Effort: Pulmonary effort is normal.  ?   Breath sounds: Normal breath sounds. No wheezing or rales.  ?Musculoskeletal:  ?   Right lower leg: No edema.  ?   Left lower leg: No edema.  ? ? ? ?  ?Lab Results: ?Reviewed  and interpreted: ?Telemetry 07/15/2021: ?No arrhythmia ? ? ? ? ?EKG 07/15/2021: ?Sinus rhythm, occasional PAC ? ? ?Assessment & Recommendations: ? ?61 y.o. William Nixon male  with type 2 diabetes mellitus, paroxysmal atrial fibrillation, OSA on CPAP, presented with syncope, A-fib. ? ?Syncope: ?Episode of syncope this morning, preceded by lightheadedness, orthostatic in the ER requiring rehydration ?Suspect his syncope was due to hypovolemia and hypotension, possibly due to dehydration in the setting of Jardiance 25 mg daily, poor oral hydration, and an athletic male who runs 4 miles a day.  A-fib is less likely to be cause of his syncope, although postconversion syncope can be seen in A-fib. ?Currently, he is in sinus rhythm, asymptomatic, orthostatics negative.  He is eager to go home. ?I think this is reasonable.  See below regarding A-fib recommendations. ?Recommend holding Jardiance 25 mg, as well as olmesartan 40 mg daily for now. ?Resume olmesartan at 20 mg if SBP >140 mmHg. ?Recommend reaching out to endocrinologist Dr. Cruzita Lederer, consider resuming Jardiance at low-dose of 10 mg next week, if blood pressure stabilizes. ?Certainly recommend increase hydration. ? ?Paroxysmal A-fib: ?Noted on EKG by EMS, see media. ?Currently in sinus rhythm. ?CHA2DS2-VASc of 2, annual stroke risk 2.2%. ?Given that he has not had recurrence of A-fib, I recommended resuming anticoagulation Eliquis 5 mg twice daily. ?I encouraged him to get Apple Watch for monitoring of his A-fib. ?As such, he is not symptomatic from A-fib. ?In future, if he has increased frequency or severity of A-fib, could consider antiarrhythmic therapy versus ablation. ?Continue CPAP for OSA. ?Continue diabetes management with Dr. Cruzita Lederer. ? ?We will order echocardiogram, 2-week cardiac telemetry, and then follow-up with Dr. Einar Gip ? ? ?Discussed interpretation of tests and management recommendations with the primary team ? ?  ? ?Nigel Mormon, MD ?Pager:  [phone] ?Office: [phone] ? ?

## 2021-07-15 NOTE — ED Provider Notes (Signed)
?[facility] ?Provider Note ? ? ?CSN: [id number] ?Arrival date & time: 07/15/21  3220 ? ?  ? ?History ? ?Chief Complaint  ?Patient presents with  ? Loss of Consciousness  ? ? ?William Nixon is a 61 y.o. male who presents emergency department with chief complaint of syncope.Marland Kitchen  History is given by the patient, his wife at bedside.  The patient that he got up this morning to use the bathroom and brushes teeth.  He noticed that he felt really lightheaded so he sat down.  He began to feel better and stood up to get ready to brush his teeth and then passed out.  His wife notes that she heard a thud.  But she did not think anything of it.  She then heard a second thud and thinks it may be try to get back up and passed out again.  She found him on the floor seated up against the wall.  The patient does not use blood thinners.  He denies racing or skipping in his heart, palpitations, unilateral leg swelling, chest pain, shortness of breath, nausea vomiting or diarrhea.  He has a history of syncope in the past and was noted at that time to have atrial fibrillation.  The patient states that he was previously on blood thinners but had these discontinued after a sleep apnea test and has been on CPAP since that time.  He recently had an upper respiratory infection about a week and a half ago that ran around the entire family but was not confined to a bed denies a history of PE or DVT.  Patient denies melena or hematochezia.EMS reports that during transport the patient apparently became bradycardic and diaphoretic.  They briefly started a epi drip was discontinued on arrival. ? ? ?Loss of Consciousness ? ?  ? ?Home Medications ?Prior to Admission medications   ?Medication Sig Start Date End Date Taking? Authorizing Provider  ?Ascorbic Acid (VITAMIN C PO) Take 1 tablet by mouth daily.   Yes [provider]  ?Boswellia-Glucosamine-Vit D (OSTEO BI-FLEX ONE PER DAY PO) Take 1 tablet  by mouth daily.   Yes [provider]  ?Cromolyn Sodium (NASAL ALLERGY NA) Place 2 sprays into the nose daily as needed (allergies).   Yes [provider]  ?diltiazem (CARDIZEM CD) 180 MG 24 hr capsule TAKE ONE CAPSULE BY MOUTH DAILY 05/27/21  Yes Adrian Prows, MD  ?glipiZIDE (GLUCOTROL XL) 5 MG 24 hr tablet Take 1 tablet (5 mg total) by mouth daily with breakfast. ?Patient taking differently: Take 5 mg by mouth at bedtime. 10/15/20  Yes Philemon Kingdom, MD  ?glipiZIDE (GLUCOTROL) 5 MG tablet Take 5 mg by mouth daily before breakfast.   Yes [provider]  ?JARDIANCE 25 MG TABS tablet TAKE 1 TABLET BY MOUTH EVERY DAY 06/03/19  Yes Philemon Kingdom, MD  ?metFORMIN (GLUCOPHAGE) 1000 MG tablet TAKE ONE TABLET BY MOUTH TWICE A DAY ?Patient taking differently: Take 1,000 mg by mouth 2 (two) times daily with a meal. TAKE ONE TABLET BY MOUTH TWICE A DAY 04/09/21  Yes Philemon Kingdom, MD  ?Multiple Vitamin (MULTIVITAMIN WITH MINERALS) TABS Take 1 tablet by mouth daily. Centrum   Yes [provider]  ?olmesartan (BENICAR) 40 MG tablet Take 40 mg by mouth See admin instructions. 1/2 tablet at breakfast and 1/2 tablet at bedtime 05/27/21  Yes [provider]  ?rosuvastatin (CRESTOR) 10 MG tablet Take 10 mg by mouth at bedtime. 01/23/19  Yes [provider]  ?sildenafil (REVATIO) 20 MG tablet Take 1 tablet (20 mg total) by mouth at bedtime as needed. 01/16/18  Yes Dorena Cookey, MD  ?TRADJENTA 5 MG TABS tablet Take 5 mg by mouth daily. 03/30/21  Yes [provider]  ?apixaban (ELIQUIS) 5 MG TABS tablet Take 1 tablet (5 mg total) by mouth 2 (two) times daily. 07/15/21   Patwardhan, Reynold Bowen, MD  ?Continuous Blood Gluc Sensor (FREESTYLE LIBRE 2 SENSOR) MISC Use as instructed. Change every 14 days 04/07/21   Philemon Kingdom, MD  ?losartan (COZAAR) 100 MG tablet Take 1 tablet (100 mg total) by mouth at bedtime. ?Patient not taking: Reported on 07/15/2021 06/21/19   Adrian Prows, MD  ?Jonetta Speak Lancets 47S MISC Use to check blood sugar 3 times a day. 07/05/18   Philemon Kingdom, MD  ?[facility] VERIO test strip TEST THREE TIMES A DAY 08/26/20   Philemon Kingdom, MD  ?   ? ?Allergies    ?Patient has no known allergies.   ? ?Review of Systems   ?Review of Systems  ?Cardiovascular:  Positive for syncope.  ? ?Physical Exam ?Updated Vital Signs ?BP (!) 105/56   Pulse 84   Temp 98 ?F (36.7 ?C) (Oral)   Resp (!) 27   Ht '5\' 8"'$  (1.727 m)   Wt 70 kg   SpO2 99%   BMI 23.46 kg/m?  ?Physical Exam ?Vitals and nursing note reviewed.  ?Constitutional:   ?   General: He is not in acute distress. ?   Appearance: He is well-developed. He is not diaphoretic.  ?HENT:  ?   Head: Normocephalic and atraumatic.  ?   Mouth/Throat:  ?   Mouth: Mucous membranes are dry.  ?Eyes:  ?   General: No scleral icterus. ?   Extraocular Movements: Extraocular movements intact.  ?   Conjunctiva/sclera: Conjunctivae normal.  ?   Pupils: Pupils are equal, round, and reactive to light.  ?Cardiovascular:  ?   Rate and Rhythm: Normal rate and regular rhythm.  ?   Heart sounds: Normal heart sounds.  ?Pulmonary:  ?   Effort: Pulmonary effort is normal. No respiratory distress.  ?   Breath sounds: Normal breath sounds.  ?Abdominal:  ?   Palpations: Abdomen is soft.  ?   Tenderness: There is no abdominal tenderness.  ?Musculoskeletal:  ?   Cervical back: Normal range of motion and neck supple.  ?Skin: ?   General: Skin is warm and dry.  ?Neurological:  ?   General: No focal deficit present.  ?   Mental Status: He is alert and oriented to person, place, and time.  ?   Cranial Nerves: No cranial nerve deficit.  ?   Sensory: No sensory deficit.  ?   Motor: No weakness.  ?   Coordination: Coordination normal.  ?Psychiatric:     ?   Behavior: Behavior normal.  ? ? ?ED Results / Procedures / Treatments   ?Labs ?(all labs ordered are listed, but only abnormal results are displayed) ?Labs Reviewed  ?BASIC METABOLIC PANEL -  Abnormal; Notable for the following components:  ?    Result Value  ? Glucose, Bld 228 (*)   ? Calcium 8.7 (*)   ? All other components within normal limits  ?URINALYSIS, ROUTINE W REFLEX MICROSCOPIC - Abnormal; Notable for the following components:  ? Glucose, UA >=500 (*)   ? All other components within normal limits  ?D-DIMER, QUANTITATIVE - Abnormal; Notable for the following components:  ?  D-Dimer, Quant 0.71 (*)   ? All other components within normal limits  ?URINALYSIS, MICROSCOPIC (REFLEX) - Abnormal; Notable for the following components:  ? Bacteria, UA RARE (*)   ? All other components within normal limits  ?CBG MONITORING, ED - Abnormal; Notable for the following components:  ? Glucose-Capillary 215 (*)   ? All other components within normal limits  ?CBC  ?MAGNESIUM  ?TROPONIN I (HIGH SENSITIVITY)  ?TROPONIN I (HIGH SENSITIVITY)  ? ? ?EKG ?None ? ?Radiology ?CT Angio Chest PE W and/or Wo Contrast ? ?Result Date: 07/15/2021 ?CLINICAL DATA:  Elevated D-dimer EXAM: CT ANGIOGRAPHY CHEST WITH CONTRAST TECHNIQUE: Multidetector CT imaging of the chest was performed using the standard protocol during bolus administration of intravenous contrast. Multiplanar CT image reconstructions and MIPs were obtained to evaluate the vascular anatomy. RADIATION DOSE REDUCTION: This exam was performed according to the departmental dose-optimization program which includes automated exposure control, adjustment of the mA and/or kV according to patient size and/or use of iterative reconstruction technique. CONTRAST:  139m OMNIPAQUE IOHEXOL 350 MG/ML SOLN COMPARISON:  Chest x-ray earlier the same day FINDINGS: Cardiovascular: No pulmonary embolism identified. Main pulmonary artery is normal caliber. Heart is mildly enlarged. No pericardial effusion identified. Coronary artery calcifications noted. Thoracic aorta is normal in caliber with mild atherosclerotic plaques. Mediastinum/Nodes: 1.4 cm hypodense nodule in the left thyroid  lobe. No bulky axillary, mediastinal or hilar lymphadenopathy identified. Lungs/Pleura: Breathing motion and mild subsegmental atelectatic changes in the lungs. No focal consolidation identified. Central bronchial wal

## 2021-07-15 NOTE — Addendum Note (Signed)
Addended by: Nigel Mormon on: 07/15/2021 04:13 PM ? ? Modules accepted: Orders ? ?

## 2021-07-16 ENCOUNTER — Encounter: Payer: Self-pay | Admitting: Internal Medicine

## 2021-07-16 ENCOUNTER — Other Ambulatory Visit: Payer: Self-pay | Admitting: Internal Medicine

## 2021-07-16 DIAGNOSIS — E041 Nontoxic single thyroid nodule: Secondary | ICD-10-CM

## 2021-07-16 MED ORDER — EMPAGLIFLOZIN 10 MG PO TABS: 10.0000 mg | ORAL_TABLET | Freq: Every day | ORAL | 3 refills | Status: DC

## 2021-07-22 ENCOUNTER — Ambulatory Visit: Payer: 59

## 2021-07-22 ENCOUNTER — Inpatient Hospital Stay: Payer: 59

## 2021-07-22 DIAGNOSIS — I480 Paroxysmal atrial fibrillation: Secondary | ICD-10-CM

## 2021-07-26 NOTE — Progress Notes (Signed)
Seeing you in two weeks. Recent Afib, probable vasovagal syncope.  ? ?He was also interested in Cardiologs monitoring, as mentioned to [facility] on the day of echocardiogram. I did not hear back from them re: preferred email address to send the link. Please follow up with the patient. ? ?Thanks ?MJP ? ?

## 2021-07-26 NOTE — Progress Notes (Signed)
He called me about this, I do not do this, so you may reach out to him. I spoke to him personally. He can also f/u with you as well.

## 2021-07-26 NOTE — Progress Notes (Signed)
Normal heart function. I have requested Dr. Wonda Amis to f/u with you going forwards unless you prefer to f/u with me.

## 2021-07-27 ENCOUNTER — Ambulatory Visit: Payer: BC Managed Care – PPO | Admitting: Neurology

## 2021-07-29 ENCOUNTER — Ambulatory Visit: Payer: 59

## 2021-08-03 ENCOUNTER — Encounter: Payer: Self-pay | Admitting: Internal Medicine

## 2021-08-06 MED ORDER — EMPAGLIFLOZIN 25 MG PO TABS: 25.0000 mg | ORAL_TABLET | Freq: Every day | ORAL | 1 refills | Status: DC

## 2021-08-12 ENCOUNTER — Ambulatory Visit: Payer: 59 | Admitting: Cardiology

## 2021-08-12 ENCOUNTER — Encounter: Payer: Self-pay | Admitting: Cardiology

## 2021-08-12 VITALS — BP 141/72 | HR 68 | Temp 97.7°F | Resp 17 | Ht 68.0 in | Wt 159.0 lb

## 2021-08-12 DIAGNOSIS — I10 Essential (primary) hypertension: Secondary | ICD-10-CM

## 2021-08-12 DIAGNOSIS — R55 Syncope and collapse: Secondary | ICD-10-CM

## 2021-08-12 DIAGNOSIS — I480 Paroxysmal atrial fibrillation: Secondary | ICD-10-CM

## 2021-08-12 MED ORDER — LOSARTAN POTASSIUM 25 MG PO TABS: 25.0000 mg | ORAL_TABLET | Freq: Every evening | ORAL | 2 refills | Status: DC

## 2021-08-12 MED ORDER — METOPROLOL TARTRATE 50 MG PO TABS: 50.0000 mg | ORAL_TABLET | Freq: Two times a day (BID) | ORAL | 2 refills | Status: DC

## 2021-08-12 NOTE — Patient Instructions (Signed)
Discontinue diltiazem. ? ?I have started you on metoprolol tartrate 50 mg twice daily.  Please watch your blood pressure and heart rate, continue to exercise as usual. ? ?Monitor your apple watch parameters probably at least once or twice a day. ? ?Do not start taking losartan for at least a week since I am starting on metoprolol.  If blood pressure >110/70 mmHg, he can start with losartan in the evening.  I would like to see you back here in 4 weeks. ?

## 2021-08-12 NOTE — Progress Notes (Signed)
Seeing you today at 2:15 PM. ? ?Thanks ?MJP ? ?

## 2021-08-12 NOTE — Progress Notes (Signed)
? ?Primary Physician/Referring:  Prince Solian, MD ? ?Patient ID: William Nixon, male    DOB: [birth year], 61 y.o.   MRN: [id number] ? ?Chief Complaint  ?Patient presents with  ? Atrial Fibrillation  ? Loss of Consciousness  ? ?HPI:   ? ?William Nixon  is a 61 y.o. [place] male patient.  Pneumonitis, hyperlipidemia, who I had seen for a brief episode of atrial fibrillation in September 2022 and has been on CPAP since then with no recurrence.  He presented to emergency room on 07/15/2021 with syncope, new onset atrial fibrillation with RVR. ? ?He felt lightheaded and while in the bathroom had an episode of syncope and EMS was called and showed atrial fibrillation with RVR and was hypotensive, fluid resuscitation was performed and discharged home with negative CT angiogram of the chest for PE done for elevated D-dimer. ? ?He was back in sinus rhythm upon presentation to the emergency room.  He now presents for follow-up. Asymptomatic.  ? ?Past Medical History:  ?Diagnosis Date  ? Diabetes mellitus   ? type II  ? Hyperlipidemia   ? pt. states that hdl ldl was elevated denies hyperlipidemia  ? Hypertension   ? Hyperthyroidism   ? pt. denies having any thyroid issues  ? Personal history of colonic polyps - adenomas 08/06/2013  ? 07/2013 - 3 diminutive adenomas - repeat colonoscopy 2018  ? Right hydrocele   ? S/P ACL repair   ? right   ? ?Past Surgical History:  ?Procedure Laterality Date  ? COLONOSCOPY    ? KNEE SURGERY  2002  ? right  ? POLYPECTOMY    ? ?Family History  ?Problem Relation Age of Onset  ? Alcohol abuse Other   ? Diabetes Other   ? Hypertension Other   ? Diabetes Mother   ? Diabetes Father   ? Diabetes Brother   ? Diabetes Sister   ? Colon cancer Neg Hx   ? Rectal cancer Neg Hx   ? Stomach cancer Neg Hx   ? Esophageal cancer Neg Hx   ? Pancreatic cancer Neg Hx   ? Colon polyps Neg Hx   ?  ?Social History  ? ?Tobacco Use  ? Smoking status: Never  ? Smokeless tobacco: Never  ?Substance Use  Topics  ? Alcohol use: Yes  ?  Alcohol/week: 2.0 standard drinks  ?  Types: 2 Standard drinks or equivalent per week  ?  Comment: occasionally  ? ?Marital Status: Married  ?ROS  ?Review of Systems  ?Cardiovascular:  Negative for chest pain, dyspnea on exertion and leg swelling.  ?Objective  ?Blood pressure (!) 141/72, pulse 68, temperature 97.7 ?F (36.5 ?C), resp. rate 17, height '5\' 8"'$  (1.727 m), weight 159 lb (72.1 kg), SpO2 99 %. Body mass index is 24.18 kg/m?.  ? ?  08/12/2021  ?  2:24 PM 07/15/2021  ?  4:15 PM 07/15/2021  ?  4:00 PM  ?Vitals with BMI  ?Height '5\' 8"'$     ?Weight 159 lbs    ?BMI 24.18    ?Systolic 326 712 458  ?Diastolic 72 56 62  ?Pulse 68 84 87  ?  ?Physical Exam ?Neck:  ?   Vascular: No JVD.  ?Cardiovascular:  ?   Rate and Rhythm: Normal rate and regular rhythm.  ?   Pulses: Intact distal pulses.  ?   Heart sounds: Normal heart sounds. No murmur heard. ?  No gallop.  ?Pulmonary:  ?   Effort: Pulmonary effort is  normal.  ?   Breath sounds: Normal breath sounds.  ?Abdominal:  ?   General: Bowel sounds are normal.  ?   Palpations: Abdomen is soft.  ?Musculoskeletal:  ?   Right lower leg: No edema.  ?   Left lower leg: No edema.  ? ?Medications and allergies  ?No Known Allergies  ? ?Medication list after today's encounter  ? ?Current Outpatient Medications:  ?  apixaban (ELIQUIS) 5 MG TABS tablet, Take 1 tablet (5 mg total) by mouth 2 (two) times daily., Disp: 60 tablet, Rfl: 1 ?  Ascorbic Acid (VITAMIN C PO), Take 1 tablet by mouth daily., Disp: , Rfl:  ?  Boswellia-Glucosamine-Vit D (OSTEO BI-FLEX ONE PER DAY PO), Take 1 tablet by mouth daily., Disp: , Rfl:  ?  Continuous Blood Gluc Sensor (FREESTYLE LIBRE 2 SENSOR) MISC, Use as instructed. Change every 14 days, Disp: 6 each, Rfl: 3 ?  Cromolyn Sodium (NASAL ALLERGY NA), Place 2 sprays into the nose daily as needed (allergies)., Disp: , Rfl:  ?  empagliflozin (JARDIANCE) 25 MG TABS tablet, Take 1 tablet (25 mg total) by mouth daily before breakfast.,  Disp: 90 tablet, Rfl: 1 ?  glipiZIDE (GLUCOTROL XL) 5 MG 24 hr tablet, Take 1 tablet (5 mg total) by mouth daily with breakfast. (Patient taking differently: Take 5 mg by mouth at bedtime.), Disp: 90 tablet, Rfl: 3 ?  glipiZIDE (GLUCOTROL) 5 MG tablet, Take 5 mg by mouth daily before breakfast., Disp: , Rfl:  ?  losartan (COZAAR) 25 MG tablet, Take 1 tablet (25 mg total) by mouth every evening., Disp: 30 tablet, Rfl: 2 ?  metFORMIN (GLUCOPHAGE) 1000 MG tablet, TAKE ONE TABLET BY MOUTH TWICE A DAY (Patient taking differently: Take 1,000 mg by mouth 2 (two) times daily with a meal. TAKE ONE TABLET BY MOUTH TWICE A DAY), Disp: 180 tablet, Rfl: 3 ?  metoprolol tartrate (LOPRESSOR) 50 MG tablet, Take 1 tablet (50 mg total) by mouth 2 (two) times daily., Disp: 60 tablet, Rfl: 2 ?  Multiple Vitamin (MULTIVITAMIN WITH MINERALS) TABS, Take 1 tablet by mouth daily. Centrum, Disp: , Rfl:  ?  OneTouch Delica Lancets 96E MISC, Use to check blood sugar 3 times a day., Disp: 300 each, Rfl: 12 ?  ONETOUCH VERIO test strip, TEST THREE TIMES A DAY, Disp: 100 strip, Rfl: 11 ?  rosuvastatin (CRESTOR) 10 MG tablet, Take 10 mg by mouth at bedtime., Disp: , Rfl:  ?  TRADJENTA 5 MG TABS tablet, Take 5 mg by mouth daily., Disp: , Rfl:  ? ?Laboratory examination:  ? ?Recent Labs  ?  07/15/21 ?0849  ?NA 137  ?K 3.6  ?CL 105  ?CO2 22  ?GLUCOSE 228*  ?BUN 14  ?CREATININE 0.90  ?CALCIUM 8.7*  ?GFRNONAA >60  ? ?CrCl cannot be calculated (Patient's most recent lab result is older than the maximum 21 days allowed.).  ? ?  Latest Ref Rng & Units 07/15/2021  ?  8:49 AM 01/16/2018  ? 10:08 AM 01/10/2017  ?  9:59 AM  ?CMP  ?Glucose 70 - 99 mg/dL 228   134   121    ?BUN 6 - 20 mg/dL '14   15   10    '$ ?Creatinine 0.61 - 1.24 mg/dL 0.90   0.88   0.78    ?Sodium 135 - 145 mmol/L 137   139   138    ?Potassium 3.5 - 5.1 mmol/L 3.6   4.4   4.1    ?  Chloride 98 - 111 mmol/L 105   102   100    ?CO2 22 - 32 mmol/L '22   29   31    '$ ?Calcium 8.9 - 10.3 mg/dL 8.7   9.3    9.5    ?Total Protein 6.0 - 8.3 g/dL  7.3   7.2    ?Total Bilirubin 0.2 - 1.2 mg/dL  0.7   0.9    ?Alkaline Phos 39 - 117 U/L  68   65    ?AST 0 - 37 U/L  25   23    ?ALT 0 - 53 U/L  28   24    ? ? ?  Latest Ref Rng & Units 07/15/2021  ?  8:49 AM 01/16/2018  ? 10:08 AM 01/10/2017  ?  9:59 AM  ?CBC  ?WBC 4.0 - 10.5 K/uL 10.3   3.6   5.8    ?Hemoglobin 13.0 - 17.0 g/dL 14.3   15.5   15.6    ?Hematocrit 39.0 - 52.0 % 42.9   44.2   45.9    ?Platelets 150 - 400 K/uL 279   136.0   148.0    ? ? ?HEMOGLOBIN A1C ?Lab Results  ?Component Value Date  ? HGBA1C 7.0 (A) 04/05/2021  ? ?External labs:  ? ?Cholesterol, total 124.000 m 01/21/2021 ?HDL 24.000 mg 01/21/2021 ?LDL 65.000 mg 01/21/2021 ?Triglycerides 174.000 m 01/21/2021 ? ?TSH 3.500 01/21/2021 ? ?Radiology:  ? ? ?Cardiac Studies:  ? ?PCV ECHOCARDIOGRAM COMPLETE 07/22/2021  ?Normal LV systolic function with visual EF 60-65%. Left ventricle cavity is normal in size. Mild concentric hypertrophy of the left ventricle. Normal global wall motion. Unable to evaluate diastolic function due to underlying rhythm; but filling pattern suggestive of normal LAP. ?No significant valvular heart disease. ?Compared to study 04/09/2019 no significant change. ? ?PCV MYOCARDIAL PERFUSION WO LEXISCAN 03/20/2019  ?No previous exam available for comparison. ?Lexiscan nuclear stress test performed using 1-day protocol. Myocardial perfusion imaging is normal. Left ventricular ejection fraction is  60% with normal wall motion. Low risk study. ? ?Mobile cardiac telemetry 13 days 07/22/2021 - 08/05/2021: ?Dominant rhythm: Sinus. ?HR 49-145 bpm. Avg HR 78 bpm, in sinus rhythm. ?Atrial fibrillation occurred (28% burden), ventricular rate 54-188 bpm, avg 100 bpm. ?1 episode of atrial tachcyardia at 143 bpm for 12 beats. ?<1% isolated SVE, couplet/triplets. ?0 episodes of VT. ?<1% isolated VE, couplets. ?No atrial fibrillationVT/high grade AV block, sinus pause >3sec noted. ?0 patient triggered events.  ? ?EKG:   ? ?EKG 08/12/2021: Normal sinus rhythm at rate of 63 bpm, left atrial enlargement, normal axis, incomplete right bundle branch block.  No evidence of ischemia, normal QT interval.   ? ?Assessment  ? ?  ICD-

## 2021-08-13 ENCOUNTER — Ambulatory Visit
Admission: RE | Admit: 2021-08-13 | Discharge: 2021-08-13 | Disposition: A | Discharge status: Home / Self Care | Payer: 59 | Source: Ambulatory Visit | Attending: Internal Medicine | Admitting: Internal Medicine

## 2021-08-13 DIAGNOSIS — E041 Nontoxic single thyroid nodule: Secondary | ICD-10-CM

## 2021-08-23 ENCOUNTER — Ambulatory Visit: Payer: 59 | Admitting: Neurology

## 2021-08-23 ENCOUNTER — Encounter: Payer: Self-pay | Admitting: Neurology

## 2021-08-23 VITALS — BP 139/73 | HR 60 | Ht 68.0 in | Wt 151.5 lb

## 2021-08-23 DIAGNOSIS — Z9989 Dependence on other enabling machines and devices: Secondary | ICD-10-CM | POA: Diagnosis not present

## 2021-08-23 DIAGNOSIS — G4733 Obstructive sleep apnea (adult) (pediatric): Secondary | ICD-10-CM

## 2021-08-23 DIAGNOSIS — E785 Hyperlipidemia, unspecified: Secondary | ICD-10-CM

## 2021-08-23 DIAGNOSIS — I480 Paroxysmal atrial fibrillation: Secondary | ICD-10-CM

## 2021-08-23 DIAGNOSIS — E1169 Type 2 diabetes mellitus with other specified complication: Secondary | ICD-10-CM | POA: Diagnosis not present

## 2021-08-23 NOTE — Progress Notes (Signed)
CM sent to [facility] for new order ?

## 2021-08-23 NOTE — Patient Instructions (Signed)

## 2021-08-23 NOTE — Progress Notes (Signed)
? ? ?SLEEP MEDICINE CLINIC ?  ? ?Provider:  Larey Seat, MD  ?Primary Care Physician:  Prince Solian, MD ?[address] ?[place] [address]  ? ?  ?Referring Provider: Dr Einar Gip, MD Cardiologist ?  ?  ?    ?Chief Complaint according to patient   ?Patient presents with:  ?  ? New Patient (Initial Visit)  ?   Dr. Einar Gip originally referred for sleep study-at the time new onset atrial fibrillation, has 20 year- history of DM, On CPAP now: ?Here for yearly CPAP f/u. Pt reports doing well on CPAP. Pt reports having an episode of syncope on 3/30 early morning and was seen at the ED. Low BP and HR. Was Afib.  ?  ?  ?HISTORY OF PRESENT ILLNESS:  ? ? ?William Nixon,  is a 61  year- old Asian male patient of [place] descent , and seen in a RV on 08/23/2021. Dr Einar Gip saw him on 08-12-2021: after a syncope and low BP, dehydrated and was on decongestants.  ?He is interested in hearing about INSPIRE;  ? ?He underwent sleep testing here last year -his sleep test actually was performed on 07/24/2019 he had a moderate degree of sleep apnea obstructive with an AHI of 19.6 some snoring was recorded no REM sleep accentuation was noted.  There was no note of hypoxemia.   ?So this patient could potentially be a candidate for inspire device.  Looking at his current compliance. ?In spite of not exactly living his CPAP he has been a highly compliant user use the machine 20 out of 30 days with a compliance and hours of 93%.  Average user time is 6 hours and 46 minutes and he fulfills all insurance that compliance data.   ? ?His machine was set up on 05-22-2019.  It is an AutoSet AirSense 10 with a minimum pressure of 5 and a maximum pressure of 16 cm water ,  as well as 3 cm EPR level.  The residual apnea is 0 point 0.8/h which is a good resolution of apnea.  95th percentile pressure is 11 cmH2O well within the current settings.  Air leak is low at the 95th percentile leaks only 1.2 L a minute that is an excellent fit in terms of  mask.  So basically I would hesitate to recommend an alternative therapy because this 1 seems to work so well.  The patient states that when he had his syncope spell it was also discovered that he still was in atrial fibrillation.  He has not been undergoing cardiac ablation or cardiac "reset', just beta blocker rate controlled.  ? ?Inspire works in non-REM sleep apnea without hypoxia. He is less interested in a surgical procedure.  ? ?Loss of Consciousness 07-15-2021.   ?  ?  ?William Nixon is a 61 y.o. male who presents emergency department with chief complaint of syncope.Marland Kitchen  History is given by the patient, his wife at bedside.  The patient that he got up this morning to use the bathroom and brushes teeth.  He noticed that he felt really lightheaded so he sat down.  He began to feel better and stood up to get ready to brush his teeth and then passed out.  His wife notes that she heard a thud.  But she did not think anything of it.  She then heard a second thud and thinks it may be try to get back up and passed out again.  She found him on the  floor seated up against the wall.  The patient does not use blood thinners.  He denies racing or skipping in his heart, palpitations, unilateral leg swelling, chest pain, shortness of breath, nausea vomiting or diarrhea.  He has a history of syncope in the past and was noted at that time to have atrial fibrillation.  The patient states that he was previously on blood thinners but had these discontinued after a sleep apnea test and has been on CPAP since that time.  He recently had an upper respiratory infection about a week and a half ago that ran around the entire family but was not confined to a bed denies a history of PE or DVT.  Patient denies melena or hematochezia.EMS reports that during transport the patient apparently became bradycardic and diaphoretic.  They briefly started a epi drip was discontinued on arrival. ?  ?  ?Loss of Consciousness ? ? ?07-27-2020;Ganji  referred for atrial fibrillation- no cardioversion, no ablation, not anticoagulation. He converted to sinus rhythm.  ?The patient had undergone a home sleep test which showed a moderate degree of sleep apnea obstructive in nature with an AHI of 19.6.  And he started on autotitrator today is his yearly revisit and he has been a 93% compliant with an 6-hour 18-minute average use the settings have not changed between 5 and 16 cmH2O with 3 cm EPR his residual AHI is very well controlled 0.7/h pressure at the 95th percentile is at 11.3 cmH2O so well covered with in his current parameters.  There is almost no air leakage noted indicating that his mask is a good seal there have been some medication changes he is off Trulicity now and has returned to glipizide his diabetes is treated by Dr. Crista Curb.avs ?His general physician is Dr. Herby Abraham his cardiologist is Dr. Einar Gip.  He endorsed the Epworth sleepiness score at 12 points out of 24 which is a little bit higher than last time and he reports that more than twice a week he will wake up between 3 and 4 in the morning and feeling difficulties to return to sleep for the rest of the night.  He has remained physically active, he is not obese, he remains on diltiazem Cardizem 180 mg by mouth once a day. ? ? ? ? ? ?07-24-2019: ?William Nixon had undergone a home sleep test which showed a moderate degree of sleep apnea of obstructive origin with an AHI of 19.6.  There was some snoring recorded but no REM sleep accentuation was noted.  I prescribed an auto CPAP device with a setting between 5 and 16 cmH2O pressure and 1 cm EPR.  In the meantime Dr. Arbie Cookey has tried multiple different interfaces and he had for a while also tried melatonin but has given ample melatonin.  His compliance for CPAP is excellent he has used it 87% of the time only 1 day less than 4 hours with an average usage time of 5 hours 6 minutes at night he still feels that he does not sleep through the night or if has still  fragmented sleep.  The AutoSet has the pressure settings that I have quoted above and his residual AHI is 0.6/h so from an AHI of 19.6-0.6 is a very good resolution.  There is no Cheyne-Stokes respiration noted.  The 95th percentile pressure is at 8.9 cmH2O and the air leaks are really no the 95th percentile air leak is 1.1 L/min that is physiologically normal.  So we are meeting today to discuss  not just the compliance of CPAP but also how we can increase sleep duration and decrease sleep fragmentation. ? ?He works from home and has gained an hour of morning sleep. He works in Engineer, mining.  ?No on call duties. 8-5 office hours.  ?Has been drinking tea and alcohol on occasion. 1 glass only at a time.  ? ? ?  ?I have the pleasure of seeing William Nixon 04-02-2018, [name] with a possible sleep disorder.  He  has a past medical history of Diabetes mellitus, Hyperlipidemia, Hypertension, Hyperthyroidism, Personal history of colonic polyps - adenomas (08/06/2013), Right hydrocele, and S/P ACL repair.Marland Kitchen ?New onset atrial fib discovered by Dr. Dagmar Hait- patient had a syncope, followed work up with normal EKG, and followed by cardiac monitoring ( atrial fib positive)  , had chemical induced stress test, echocardiogram were normal.  ?  ?  ?Sleep relevant medical history: Nocturia only once,  No Tonsillectomy, no ENT or cervical injuries or surgeries.   Family medical /sleep history: No other family member is known to have OSA, but strong diabetes history.   ?Social history: Patient is working as Optometrist and lives in a household with 3 persons- he has an adult daughter, lives with wife and father- in- Sports coach. ?Pets are present- one dog. ?Tobacco use never .  ETOH use; wine and single malt, 3/ week.  ?Caffeine intake in form of Coffee( none ) Soda( none Tea ( yes- almost daily) or energy drinks. ?Regular exercise in form of running, four miles a day and light weight lifting    ?Hobbies: cricket ? ?   ?Sleep habits are as follows: The patient's dinner time is between 8.45  PM. The patient goes to bed at 11 PM and continues to sleep for several hours, wakes for one bathroom break at 4 AM.   ?The prefer

## 2021-09-07 ENCOUNTER — Ambulatory Visit: Payer: 59 | Admitting: Internal Medicine

## 2021-09-07 ENCOUNTER — Encounter: Payer: Self-pay | Admitting: Internal Medicine

## 2021-09-07 VITALS — BP 110/72 | HR 96 | Ht 68.0 in | Wt 151.0 lb

## 2021-09-07 DIAGNOSIS — E119 Type 2 diabetes mellitus without complications: Secondary | ICD-10-CM | POA: Diagnosis not present

## 2021-09-07 DIAGNOSIS — E041 Nontoxic single thyroid nodule: Secondary | ICD-10-CM

## 2021-09-07 DIAGNOSIS — E785 Hyperlipidemia, unspecified: Secondary | ICD-10-CM

## 2021-09-07 DIAGNOSIS — E1169 Type 2 diabetes mellitus with other specified complication: Secondary | ICD-10-CM | POA: Diagnosis not present

## 2021-09-07 LAB — POCT GLYCOSYLATED HEMOGLOBIN (HGB A1C): Hemoglobin A1C: 6.7 % — AB (ref 4.0–5.6)

## 2021-09-07 NOTE — Patient Instructions (Addendum)
Please use the following regimen: Medicine  Breakfast Lunch Dinner  Metformin M  M  Glipizide   G -5  Glipizide XL G XL -5    Jardiance J    Tradjeta T      Try to move dinners earlier.  Please return in 4-6 months with your sugar log.

## 2021-09-07 NOTE — Progress Notes (Signed)
Patient ID: JAXSYN AZAM, male   DOB: [birth year], 61 y.o.   MRN: [id number]  This visit occurred during the SARS-CoV-2 public health emergency.  Safety protocols were in place, including screening questions prior to the visit, additional usage of staff PPE, and extensive cleaning of exam room while observing appropriate contact time as indicated for disinfecting solutions.   HPI: ADETOKUNBO MCCADDEN is a 61 y.o.-year-old male, returning for f/u DM2, dx in ~1998, non-insulin-dependent, well controlled, without long-term complications. Last visit 7 months ago.  Interim history:  He had a syncopal episode in 02/2019 with loss of consciousness for 1 to 2 minutes - he saw cardiology (Dr. Rosalia Hammers) and this was considered a vasovagal syncope.  He was then found to have paroxysmal A. Fib 2/2 moderate OSA (now on a CPAP). He was started on a blood thinner (now off), also on statin and diltiazem.  Since last visit, he had another syncopal episode 07/15/2021.  She was found to be hypotensive (dehydration + overtreatment with antihypertensive medications) at that time.  After the last episode, we decreased the dose of Jardiance to half a 25 mg tablet >> now back to 25 mg. Also on Metoprolol, Eliquis.  Around that time, he was also found to have a thyroid nodule, incidentally, on CT.  He had a thyroid ultrasound afterwards.  Now doing well, w/o complaints.  He continues to exercise-running, weightlifting. No increased urination, blurry vision, nausea, chest pain.  Preparing to go to [place] next mo and to [place] in 02/2021.  DM2: Reviewed latest HbA1c levels: Lab Results  Component Value Date   HGBA1C 7.0 (A) 04/05/2021   HGBA1C 6.6 (A) 08/25/2020   HGBA1C 7.0 (A) 04/08/2020  01/21/2021: HbA1c 7.1% 02/14/2020: HbA1c 6.6% During summer months, as he is more active, his HbA1c is usually lower.  He is on: Medicine  Breakfast Lunch Dinner  Metformin M  M  Glipizide   G -5  Glipizide XL G XL -5     Jardiance 25 J    Januvia/Tradjenta J/T     He also tried herbal remedies: - mustard seed daily >> helps a lot - bitter melon once a week.  He tried Rybelsus >> N/V/weight loss. Tried Trulicity >> N/V and Glu in the 200s. He feels that his sugar control deteriorated after stopping glipizide XL before visit from 12/21 so we started this back.  Pt checks his sugars 1-2 times a day: - am: 98-140, 153 >> 103-139, 150 (b'day) >> 108, 117-164 >> 90, 132-140 (eats dinner later at night) - 2h after b'fast: n/c  - before lunch: n/c >> 103-165 >> n/c  - 2h after lunch: 135-179, 181 >> 160, 165 >> <170 >> 133-183 >> 170-180 - before dinner: 92-114 >> n/c - 2h after dinner: 170-180 >> 162-172 >> n/c >> <170 >> n/c  - bedtime: n/c - nighttime: n/c Lowest sugar was 80s >> 94 >> 98 >> 108 >> 90; he has hypoglycemia awareness in the 80s. Highest sugar was 165 >> 183 >> 180.  Glucometer: AccuChek (?[employer])  His meals are mostly vegetarian.  He continues to run 4 to 5 miles almost every day and lifting weights twice a week.  She goes to the gym often works and has a late dinner, around 9 PM.  -No CKD, last BUN/creatinine:  01/21/2021: 17/1.0, GFT 76.5, glucose 169 02/11/2020: 12/0.8, GFR 99.3, glucose 129 08/12/2019: 14/0.9, glucose 187 Lab Results  Component Value Date   BUN 14 07/15/2021   CREATININE  0.90 07/15/2021  Prev. On Olmesartan 40 then  losartan 25 << 100 << 50. Now off.  -No microalbuminuria Lab Results  Component Value Date   MICRALBCREAT 2.1 01/16/2018   MICRALBCREAT 2.5 01/10/2017   MICRALBCREAT 2.1 01/06/2016   MICRALBCREAT 2.3 09/10/2014   MICRALBCREAT 0.0 04/30/2014   MICRALBCREAT 0.6 10/02/2013   MICRALBCREAT 1.3 05/29/2013   MICRALBCREAT 0.8 11/07/2012   MICRALBCREAT 0.7 11/02/2011   MICRALBCREAT 1.2 04/27/2011   -+ HL; last set of lipids: 01/21/2021: 124/174/60/65 02/11/2020: 130/104/28/81 03/22/2019: 109/114/29/57 Lab Results  Component Value Date   CHOL  163 01/16/2018   HDL 27.00 (L) 01/16/2018   LDLCALC 103 (H) 01/16/2018   LDLDIRECT 96.9 11/07/2012   TRIG 165.0 (H) 01/16/2018   CHOLHDL 6 01/16/2018  On Crestor 10.  - last eye exam was in 05/2021: no DR (Dr. Deloria Lair).   -No numbness and tingling in his feet.  Thyroid nodule:  Thyroid U/S (08/13/2021): Parenchymal Echotexture: Mildly heterogenous  Isthmus: 4 mm  Right lobe:  4.4 x 1.4 x 1.5 cm  Left lobe: 4.7 x 1.4 x 1.8 cm  _________________________________________________________   Estimated total number of nodules >/= 1 cm: 1  _________________________________________________________   Nodule # 1:  Location: Left; Inferior  Maximum size: 1.3 cm; Other 2 dimensions: 1.1 x 1.0 cm  Composition: mixed cystic and solid (1)  Echogenicity: hypoechoic (2) Given size (<1.4 cm) and appearance, this nodule does NOT meet TI-RADS criteria for biopsy or dedicated follow-up.  _________________________________________________________   No additional thyroid abnormality. No hypervascularity. Negative for adenopathy.   IMPRESSION: 1.3 cm left inferior thyroid TR 3 nodule does not meet criteria for biopsy or follow-up. This correlates with the chest CT finding.   No other significant abnormality by ultrasound.  Pt denies: - feeling nodules in neck - hoarseness - dysphagia - choking - SOB with lying down  Also: 01/21/2021: TSH 3.5 02/11/2020: TSH 3.44 TSH 3.64 and free T4 1.2, both normal, on 08/12/2019. Lab Results  Component Value Date   TSH 5.19 (H) 01/16/2018   No FH of thyroid cancer. No h/o radiation tx to head or neck. No herbal supplements. No Biotin use. No recent steroids use.   He started a new job in 06/2016.  Now working from home.  Much less stress.  ROS: + See HPI  I reviewed pt's medications, allergies, PMH, social hx, family hx, and changes were documented in the history of present illness. Otherwise, unchanged from my initial visit note.  Past  Medical History:  Diagnosis Date   Diabetes mellitus    type II   Hyperlipidemia    pt. states that hdl ldl was elevated denies hyperlipidemia   Hypertension    Hyperthyroidism    pt. denies having any thyroid issues   Personal history of colonic polyps - adenomas 08/06/2013   07/2013 - 3 diminutive adenomas - repeat colonoscopy 2018   Right hydrocele    S/P ACL repair    right    Past Surgical History:  Procedure Laterality Date   COLONOSCOPY     KNEE SURGERY  2002   right   POLYPECTOMY     History   Social History   Marital Status: Married    Spouse Name: N/A    Number of Children: 1   Occupational History   controller   Social History Main Topics   Smoking status: Never Smoker    Smokeless tobacco: Never Used   Alcohol Use:     1 drink(s)  per week   Drug Use: No   Current Outpatient Medications on File Prior to Visit  Medication Sig Dispense Refill   apixaban (ELIQUIS) 5 MG TABS tablet Take 1 tablet (5 mg total) by mouth 2 (two) times daily. 60 tablet 1   Ascorbic Acid (VITAMIN C PO) Take 1 tablet by mouth daily.     Boswellia-Glucosamine-Vit D (OSTEO BI-FLEX ONE PER DAY PO) Take 1 tablet by mouth daily.     Continuous Blood Gluc Sensor (FREESTYLE LIBRE 2 SENSOR) MISC Use as instructed. Change every 14 days 6 each 3   Cromolyn Sodium (NASAL ALLERGY NA) Place 2 sprays into the nose daily as needed (allergies).     empagliflozin (JARDIANCE) 25 MG TABS tablet Take 1 tablet (25 mg total) by mouth daily before breakfast. 90 tablet 1   glipiZIDE (GLUCOTROL XL) 5 MG 24 hr tablet Take 1 tablet (5 mg total) by mouth daily with breakfast. (Patient taking differently: Take 5 mg by mouth at bedtime.) 90 tablet 3   glipiZIDE (GLUCOTROL) 5 MG tablet Take 5 mg by mouth daily before breakfast.     losartan (COZAAR) 25 MG tablet Take 1 tablet (25 mg total) by mouth every evening. 30 tablet 2   metFORMIN (GLUCOPHAGE) 1000 MG tablet TAKE ONE TABLET BY MOUTH TWICE A DAY (Patient taking  differently: Take 1,000 mg by mouth 2 (two) times daily with a meal. TAKE ONE TABLET BY MOUTH TWICE A DAY) 180 tablet 3   metoprolol tartrate (LOPRESSOR) 50 MG tablet Take 1 tablet (50 mg total) by mouth 2 (two) times daily. 60 tablet 2   Multiple Vitamin (MULTIVITAMIN WITH MINERALS) TABS Take 1 tablet by mouth daily. Centrum     OneTouch Delica Lancets 77O MISC Use to check blood sugar 3 times a day. 300 each 12   ONETOUCH VERIO test strip TEST THREE TIMES A DAY 100 strip 11   rosuvastatin (CRESTOR) 10 MG tablet Take 10 mg by mouth at bedtime.     TRADJENTA 5 MG TABS tablet Take 5 mg by mouth daily.     No current facility-administered medications on file prior to visit.   No Known Allergies Family History  Problem Relation Age of Onset   Alcohol abuse Other    Diabetes Other    Hypertension Other    Diabetes Mother    Diabetes Father    Diabetes Brother    Diabetes Sister    Colon cancer Neg Hx    Rectal cancer Neg Hx    Stomach cancer Neg Hx    Esophageal cancer Neg Hx    Pancreatic cancer Neg Hx    Colon polyps Neg Hx    PE: BP 110/72 (BP Location: Right Arm, Patient Position: Sitting, Cuff Size: Normal)   Pulse 96   Ht '5\' 8"'$  (1.727 m)   Wt 151 lb (68.5 kg)   SpO2 98%   BMI 22.96 kg/m   Wt Readings from Last 3 Encounters:  09/07/21 151 lb (68.5 kg)  08/23/21 151 lb 8 oz (68.7 kg)  08/12/21 159 lb (72.1 kg)   Constitutional: normal weight, in NAD Eyes: PERRLA, EOMI, no exophthalmos ENT: moist mucous membranes, no thyromegaly, no cervical lymphadenopathy Cardiovascular: tachycardia, RR, No MRG Respiratory: CTA B Musculoskeletal: no deformities, strength intact in all 4 Skin: moist, warm, no rashes Neurological: no tremor with outstretched hands, DTR normal in all 4 Diabetic Foot Exam - Simple   Simple Foot Form Diabetic Foot exam was performed with the following  findings: Yes 09/07/2021  3:36 PM  Visual Inspection No deformities, no ulcerations, no other skin  breakdown bilaterally: Yes Sensation Testing Intact to touch and monofilament testing bilaterally: Yes Pulse Check Posterior Tibialis and Dorsalis pulse intact bilaterally: Yes Comments     ASSESSMENT: 1. DM2, non-insulin-dependent, controlled, without complications - also FH of mild DM on mother's side  In the past, he was getting [place] and [place] from [place] - much cheaper.  2. HL  3.  Left thyroid nodule  PLAN:  1. Patient with longstanding, uncontrolled, type 2 diabetes, on oral antidiabetic regimen with metformin, sulfonylurea, DPP 4 inhibitor, and SGLT2 inhibitor.  In the past, PCP tried to stop his sulfonylurea (2021) and started on oral, then an injectable GLP-1 receptor agonist, however, he did not tolerate these: Nausea, vomiting, significantly decreased appetite and weight loss.  Afterwards, sulfonylurea was restarted.  He is on a stable regimen with the last HbA1c 7.0%, improved. -I did suggest a freestyle libre 3 CGM at last visit if covered by insurance.  He did not obtain this since last visit. -At today's visit, sugars appear to be at goal, but in the upper interval of the target range.  In the morning, sugars may be high because he is having dinner as late, even after 9 PM. My recommendation was to try to move this earlier.  Does not, we cannot use the entire dose of metformin with dinner since he had hyperglycemia during the day when he was taking 2000 mg with dinner. -He describes much less stress since he started to work from home.  He feels that this is the reason why he had his syncopal episodes (he continues on the same antihypertensive doses when his blood pressure started to improve due to the decrease stress).  He is also able to exercise consistently. -No other changes are needed in his regimen for now. - I suggested to:  Patient Instructions  Please use the following regimen: Medicine  Breakfast Lunch Dinner  Metformin M  M  Glipizide   G -5  Glipizide  XL G XL -5    Jardiance J    Tradjeta T      Try to move dinners earlier.  Please return in 4-6 months with your sugar log.    - we checked his HbA1c: 6.7% (improved) - advised to check sugars at different times of the day - 1x a day, rotating check times - advised for yearly eye exams >> he is UTD - return to clinic in 6 months  2. HL - Reviewed latest lipid panel from 01/2021: LDL at goal, but triglycerides high: 01/21/2021: 124/174/24/65 - he increased exercise and improved his diet more after the above results returned - Continues Crestor 10 mg daily without side effects.  3.  Left thyroid nodule -Incidentally seen on CT scan, and then on the dedicated thyroid ultrasound. -The nodule is found in the left inferior thyroid gland, appears to be mixed (solid-cystic), hypoechoic.  The nodule measured 1.3 x 1.1 x 1 cm. -No neck compression symptoms -Since the nodule has no worrisome features, and it is small in size, no further follow-up is needed for this  Philemon Kingdom, MD PhD [facility] Endocrinology

## 2021-09-09 ENCOUNTER — Encounter: Payer: Self-pay | Admitting: Cardiology

## 2021-09-09 ENCOUNTER — Inpatient Hospital Stay: Payer: 59

## 2021-09-09 ENCOUNTER — Ambulatory Visit: Payer: 59 | Admitting: Cardiology

## 2021-09-09 VITALS — BP 130/77 | HR 60 | Temp 97.8°F | Resp 16 | Ht 68.0 in | Wt 154.0 lb

## 2021-09-09 DIAGNOSIS — I480 Paroxysmal atrial fibrillation: Secondary | ICD-10-CM

## 2021-09-09 NOTE — Progress Notes (Signed)
Primary Physician/Referring:  Prince Solian, MD  Patient ID: William Nixon, male    DOB: [birth year], 61 y.o.   MRN: [id number]  Chief Complaint  Patient presents with   Atrial Fibrillation   Hypertension   Follow-up    4 weeks    HPI:    William Nixon  is a 61 y.o. [place] male patient.  Pneumonitis, hyperlipidemia, who I had seen for a brief episode of atrial fibrillation in September 2022 and has been on CPAP since then with no recurrence.  He presented to emergency room on 07/15/2021 with syncope, new onset atrial fibrillation with RVR.  Syncope was felt to be due to vasovagal episode and hypotension.  He was back in sinus rhythm upon presentation to the emergency room.  She presents for routine follow-up, remains asymptomatic and is accompanied by his wife.   Past Medical History:  Diagnosis Date   Diabetes mellitus    type II   Hyperlipidemia    pt. states that hdl ldl was elevated denies hyperlipidemia   Hypertension    Hyperthyroidism    pt. denies having any thyroid issues   Personal history of colonic polyps - adenomas 08/06/2013   07/2013 - 3 diminutive adenomas - repeat colonoscopy 2018   Right hydrocele    S/P ACL repair    right    Past Surgical History:  Procedure Laterality Date   COLONOSCOPY     KNEE SURGERY  2002   right   POLYPECTOMY     Family History  Problem Relation Age of Onset   Diabetes Mother    Diabetes Father    Diabetes Sister    Diabetes Brother    Alcohol abuse Other    Diabetes Other    Hypertension Other     Social History   Tobacco Use   Smoking status: Never   Smokeless tobacco: Never  Substance Use Topics   Alcohol use: Yes    Alcohol/week: 2.0 standard drinks    Types: 2 Standard drinks or equivalent per week    Comment: occasionally   Marital Status: Married  ROS  Review of Systems  Cardiovascular:  Negative for chest pain, dyspnea on exertion and leg swelling.  Objective  Blood pressure 130/77,  pulse 60, temperature 97.8 F (36.6 C), temperature source Temporal, resp. rate 16, height '5\' 8"'$  (1.727 m), weight 154 lb (69.9 kg), SpO2 99 %. Body mass index is 23.42 kg/m.     09/09/2021    2:24 PM 09/07/2021    3:25 PM 08/23/2021    2:40 PM  Vitals with BMI  Height '5\' 8"'$  '5\' 8"'$  '5\' 8"'$   Weight 154 lbs 151 lbs 151 lbs 8 oz  BMI 23.42 31.51 76.16  Systolic 073 710 626  Diastolic 77 72 73  Pulse 60 96 60    Physical Exam Neck:     Vascular: No JVD.  Cardiovascular:     Rate and Rhythm: Normal rate and regular rhythm.     Pulses: Intact distal pulses.     Heart sounds: Normal heart sounds. No murmur heard.   No gallop.  Pulmonary:     Effort: Pulmonary effort is normal.     Breath sounds: Normal breath sounds.  Abdominal:     General: Bowel sounds are normal.     Palpations: Abdomen is soft.  Musculoskeletal:     Right lower leg: No edema.     Left lower leg: No edema.   Medications and allergies  No Known Allergies   Medication list after today's encounter   Current Outpatient Medications:    apixaban (ELIQUIS) 5 MG TABS tablet, Take 1 tablet (5 mg total) by mouth 2 (two) times daily., Disp: 60 tablet, Rfl: 1   Ascorbic Acid (VITAMIN C PO), Take 1 tablet by mouth daily., Disp: , Rfl:    Boswellia-Glucosamine-Vit D (OSTEO BI-FLEX ONE PER DAY PO), Take 1 tablet by mouth daily., Disp: , Rfl:    Continuous Blood Gluc Sensor (FREESTYLE LIBRE 2 SENSOR) MISC, Use as instructed. Change every 14 days, Disp: 6 each, Rfl: 3   empagliflozin (JARDIANCE) 25 MG TABS tablet, Take 1 tablet (25 mg total) by mouth daily before breakfast., Disp: 90 tablet, Rfl: 1   glipiZIDE (GLUCOTROL XL) 5 MG 24 hr tablet, Take 1 tablet (5 mg total) by mouth daily with breakfast. (Patient taking differently: Take 5 mg by mouth at bedtime.), Disp: 90 tablet, Rfl: 3   losartan (COZAAR) 25 MG tablet, Take 1 tablet (25 mg total) by mouth every evening., Disp: 30 tablet, Rfl: 2   metFORMIN (GLUCOPHAGE) 1000 MG  tablet, TAKE ONE TABLET BY MOUTH TWICE A DAY (Patient taking differently: Take 1,000 mg by mouth 2 (two) times daily with a meal. TAKE ONE TABLET BY MOUTH TWICE A DAY), Disp: 180 tablet, Rfl: 3   metoprolol tartrate (LOPRESSOR) 50 MG tablet, Take 1 tablet (50 mg total) by mouth 2 (two) times daily., Disp: 60 tablet, Rfl: 2   Multiple Vitamin (MULTIVITAMIN WITH MINERALS) TABS, Take 1 tablet by mouth daily. Centrum, Disp: , Rfl:    OneTouch Delica Lancets 25E MISC, Use to check blood sugar 3 times a day., Disp: 300 each, Rfl: 12   ONETOUCH VERIO test strip, TEST THREE TIMES A DAY, Disp: 100 strip, Rfl: 11   rosuvastatin (CRESTOR) 10 MG tablet, Take 10 mg by mouth at bedtime., Disp: , Rfl:    TRADJENTA 5 MG TABS tablet, Take 5 mg by mouth daily., Disp: , Rfl:   Laboratory examination:   Recent Labs    07/15/21 0849  NA 137  K 3.6  CL 105  CO2 22  GLUCOSE 228*  BUN 14  CREATININE 0.90  CALCIUM 8.7*  GFRNONAA >60   CrCl cannot be calculated (Patient's most recent lab result is older than the maximum 21 days allowed.).     Latest Ref Rng & Units 07/15/2021    8:49 AM 01/16/2018   10:08 AM 01/10/2017    9:59 AM  CMP  Glucose 70 - 99 mg/dL 228   134   121    BUN 6 - 20 mg/dL '14   15   10    '$ Creatinine 0.61 - 1.24 mg/dL 0.90   0.88   0.78    Sodium 135 - 145 mmol/L 137   139   138    Potassium 3.5 - 5.1 mmol/L 3.6   4.4   4.1    Chloride 98 - 111 mmol/L 105   102   100    CO2 22 - 32 mmol/L '22   29   31    '$ Calcium 8.9 - 10.3 mg/dL 8.7   9.3   9.5    Total Protein 6.0 - 8.3 g/dL  7.3   7.2    Total Bilirubin 0.2 - 1.2 mg/dL  0.7   0.9    Alkaline Phos 39 - 117 U/L  68   65    AST 0 - 37 U/L  25  23    ALT 0 - 53 U/L  28   24        Latest Ref Rng & Units 07/15/2021    8:49 AM 01/16/2018   10:08 AM 01/10/2017    9:59 AM  CBC  WBC 4.0 - 10.5 K/uL 10.3   3.6   5.8    Hemoglobin 13.0 - 17.0 g/dL 14.3   15.5   15.6    Hematocrit 39.0 - 52.0 % 42.9   44.2   45.9    Platelets 150 - 400  K/uL 279   136.0   148.0      HEMOGLOBIN A1C Lab Results  Component Value Date   HGBA1C 6.7 (A) 09/07/2021   External labs:   Cholesterol, total 124.000 m 01/21/2021 HDL 24.000 mg 01/21/2021 LDL 65.000 mg 01/21/2021 Triglycerides 174.000 m 01/21/2021  TSH 3.500 01/21/2021  Radiology:    Cardiac Studies:   [facility] ECHOCARDIOGRAM COMPLETE 07/22/2021  Normal LV systolic function with visual EF 60-65%. Left ventricle cavity is normal in size. Mild concentric hypertrophy of the left ventricle. Normal global wall motion. Unable to evaluate diastolic function due to underlying rhythm; but filling pattern suggestive of normal LAP. No significant valvular heart disease. Compared to study 04/09/2019 no significant change.  [facility] MYOCARDIAL PERFUSION WO LEXISCAN 03/20/2019  No previous exam available for comparison. Lexiscan nuclear stress test performed using 1-day protocol. Myocardial perfusion imaging is normal. Left ventricular ejection fraction is  60% with normal wall motion. Low risk study.  Mobile cardiac telemetry 13 days 07/22/2021 - 08/05/2021: Dominant rhythm: Sinus. HR 49-145 bpm. Avg HR 78 bpm, in sinus rhythm. Atrial fibrillation occurred (28% burden), ventricular rate 54-188 bpm, avg 100 bpm. 1 episode of atrial tachcyardia at 143 bpm for 12 beats. <1% isolated SVE, couplet/triplets. 0 episodes of VT. <1% isolated VE, couplets. No atrial fibrillationVT/high grade AV block, sinus pause >3sec noted. 0 patient triggered events.   EKG:   EKG 09/09/2021: Sinus bradycardia at rate of 58 bpm, LAE, incomplete right bundle branch block, nonspecific T abnormality.  Normal QT interval.  No significant change from 08/12/2021.   Assessment     ICD-10-CM   1. Paroxysmal atrial fibrillation (HCC)  I48.0 EKG 12-Lead    Ambulatory referral to Cardiac Electrophysiology    LONG TERM MONITOR (3-14 DAYS)      CHA2DS2-VASc Score is 2.  Yearly risk of stroke: 2.3% (HTN, DM).  Score of 1=0.6;  2=2.2; 3=3.2; 4=4.8; 5=7.2; 6=9.8; 7=>9.8) -(CHF; HTN; vasc disease DM,  Male = 1; Age <65 =0; 65-74 = 1,  >75 =2; stroke/embolism= 2).    Medications Discontinued During This Encounter  Medication Reason   Cromolyn Sodium (NASAL ALLERGY NA)    glipiZIDE (GLUCOTROL) 5 MG tablet     No orders of the defined types were placed in this encounter.  Orders Placed This Encounter  Procedures   Ambulatory referral to Cardiac Electrophysiology    Referral Priority:   Routine    Referral Type:   Consultation    Referral Reason:   Specialty Services Required    Requested Specialty:   Cardiology    Number of Visits Requested:   1   LONG TERM MONITOR (3-14 DAYS)    Standing Status:   Future    Number of Occurrences:   1    Standing Expiration Date:   09/10/2022    Order Specific Question:   Where should this test be performed?    Answer:   [facility]  Order Specific Question:   Does the patient have an implanted cardiac device?    Answer:   No    Order Specific Question:   Prescribed days of wear    Answer:   28    Order Specific Question:   Type of enrollment    Answer:   Clinic Enrollment    Order Specific Question:   Release to patient    Answer:   Immediate   EKG 12-Lead   Recommendations:   William Nixon is a 61 y.o.  [place] male patient with diabetes mellitus, hyperlipidemia, who I had seen for a brief episode of atrial fibrillation in September 2022 and has been on CPAP since then with no recurrence.  He presented to emergency room on 07/15/2021 with syncope, new onset atrial fibrillation with RVR. Syncope felt to be due to dehydration vs vasovagal.  Negative CT angiogram of the chest for PE done for elevated D-dimer. He was back in sinus rhythm upon presentation to the emergency room.  He is presently asymptomatic, I reviewed his extended EKG monitoring, patient has 28% atrial fibrillation burden, has several episodes of A-fib with RVR but on questioning, patient  has still been running anywhere from 3 to 4 miles.  I suspect his A-fib with RVR was related to patient exercising during those times.  At other times A-fib has been rate controlled.  On his last office visit I had switched him from diltiazem to metoprolol to tartrate 50 mg twice daily which she is tolerating, although he has a smart watch, appropriate EKG monitoring has not been performed to evaluate for atrial fibrillation burden.  I discussed with the patient and his wife lab most probably metoprolol will not change atrial fibrillation burden and given his relatively young age, I would recommend consideration for atrial fibrillation ablation although he is asymptomatic to reduce potential complications in future including heart failure, persistent atrial fibrillation leading to symptoms.  He is also fairly active and likes to run, usually the heart rate is around 120-130 but he has recorded heart rates all the way up to 150-160 recently.  There would like to repeat event monitoring for 2 weeks to evaluate for atrial fibrillation burden.  I have made referral for EP for consideration of ablation, I have also discussed with him regarding potential for use of antiarrhythmic therapy I am concerned about underlying bradycardia and potential for side effects including dizziness and hypotension and reduced exercise capacity and the patient would like to run AAD.  I will see him back in 3 months for follow-up.  He does have severe obstructive sleep apnea and he is compliant with CPAP.    Adrian Prows, MD, [facility] 09/10/2021, 5:52 AM Office: [phone]

## 2021-09-10 ENCOUNTER — Encounter: Payer: Self-pay | Admitting: Cardiology

## 2021-09-10 ENCOUNTER — Other Ambulatory Visit: Payer: Self-pay | Admitting: Cardiology

## 2021-09-20 ENCOUNTER — Other Ambulatory Visit: Payer: Self-pay | Admitting: Internal Medicine

## 2021-10-03 ENCOUNTER — Encounter: Payer: Self-pay | Admitting: Cardiology

## 2021-10-03 ENCOUNTER — Telehealth: Payer: Self-pay | Admitting: Cardiology

## 2021-10-03 NOTE — Telephone Encounter (Signed)
Discussed with patient and his wife regarding event monitor, patient has had brief episodes of elevated heart rate while during exercise, I discussed with him that these are probably related to brief episodes of atrial tachycardia anywhere from 8-10 beats and also due to PACs and PVCs.  Advised him as long as he remains asymptomatic, as long as the heart rate does not remain elevated for greater than 30 minutes to an hour, not to be too worried about elevated heart rate.  So far he has been maintaining sinus rhythm and since being on metoprolol, has not had any further episodes of atrial fibrillation.  He does continue to exercise on a regular basis.  He also has an appointment with EP.

## 2021-10-13 ENCOUNTER — Ambulatory Visit: Payer: BC Managed Care – PPO | Admitting: Neurology

## 2021-10-25 ENCOUNTER — Encounter: Payer: Self-pay | Admitting: *Deleted

## 2021-10-25 ENCOUNTER — Telehealth: Payer: Self-pay | Admitting: Neurology

## 2021-10-25 ENCOUNTER — Encounter: Payer: Self-pay | Admitting: Cardiology

## 2021-10-25 ENCOUNTER — Ambulatory Visit: Payer: 59 | Admitting: Cardiology

## 2021-10-25 VITALS — BP 138/76 | HR 55 | Ht 68.0 in | Wt 152.0 lb

## 2021-10-25 DIAGNOSIS — Z01812 Encounter for preprocedural laboratory examination: Secondary | ICD-10-CM

## 2021-10-25 DIAGNOSIS — I4819 Other persistent atrial fibrillation: Secondary | ICD-10-CM | POA: Diagnosis not present

## 2021-10-25 NOTE — Patient Instructions (Signed)
Medication Instructions:  Your physician recommends that you continue on your current medications as directed. Please refer to the Current Medication list given to you today.  *If you need a refill on your cardiac medications before your next appointment, please call your pharmacy*   Lab Work: Pre procedure labs -- see procedure instruction letter:  BMP & CBC  If you have labs (blood work) drawn today and your tests are completely normal, you will receive your results only by: [place] (if you have MyChart) OR A paper copy in the mail If you have any lab test that is abnormal or we need to change your treatment, we will call you to review the results.   Testing/Procedures: Your physician has requested that you have cardiac CT within 7 days PRIOR to your ablation. Cardiac computed tomography (CT) is a painless test that uses an x-ray machine to take clear, detailed pictures of your heart.  Please follow instruction below located under "other instructions". You will get a call from our office to schedule the date for this test.  Your physician has recommended that you have an ablation. Catheter ablation is a medical procedure used to treat some cardiac arrhythmias (irregular heartbeats). During catheter ablation, a long, thin, flexible tube is put into a blood vessel in your groin (upper thigh), or neck. This tube is called an ablation catheter. It is then guided to your heart through the blood vessel. Radio frequency waves destroy small areas of heart tissue where abnormal heartbeats may cause an arrhythmia to start. Please follow instruction letter given to you today.   Follow-Up: At [facility], you and your health needs are our priority.  As part of our continuing mission to provide you with exceptional heart care, we have created designated Provider Care Teams.  These Care Teams include your primary Cardiologist (physician) and Advanced Practice Providers (APPs -  Physician  Assistants and Nurse Practitioners) who all work together to provide you with the care you need, when you need it.  We recommend signing up for the patient portal called "MyChart".  Sign up information is provided on this After Visit Summary.  MyChart is used to connect with patients for Virtual Visits (Telemedicine).  Patients are able to view lab/test results, encounter notes, upcoming appointments, etc.  Non-urgent messages can be sent to your provider as well.   To learn more about what you can do with MyChart, go to [email or web address].    Your next appointment:   1 month(s) after your ablation  The format for your next appointment:   In Person  Provider:   AFib clinic   Thank you for choosing [facility]!!   Trinidad Curet, RN [phone]    Other Instructions

## 2021-10-25 NOTE — Telephone Encounter (Signed)
[employer] pending uploaded notes on the portal  

## 2021-10-25 NOTE — Progress Notes (Signed)
Electrophysiology Office Note   Date:  10/25/2021   ID:  William Nixon, DOB 1960/09/13, MRN [id number]  PCP:  Prince Solian, MD  Cardiologist:  Einar Gip Primary Electrophysiologist:  Constance Haw, MD    Chief Complaint: AF   History of Present Illness: William Nixon is a 61 y.o. male who is being seen today for the evaluation of AF at the request of Adrian Prows, MD. Presenting today for electrophysiology evaluation.  He has a history significant for type 2 diabetes, hyperlipidemia, hypertension.  He also has sleep apnea and has been using CPAP.  He presented the emergency room 07/15/2021 with syncope and new onset atrial fibrillation with rapid rates.  Syncope was felt due to a vasovagal episode.  He converted to sinus rhythm while in the emergency room.  He wore a cardiac monitor that showed episodes of rapid atrial fibrillation with a burden of 28%.  He does not have much in the way of symptoms, but would prefer to prevent further episodes of atrial fibrillation.  Today, he denies symptoms of palpitations, chest pain, shortness of breath, orthopnea, PND, lower extremity edema, claudication, dizziness, presyncope, syncope, bleeding, or neurologic sequela. The patient is tolerating medications without difficulties.    Past Medical History:  Diagnosis Date   Diabetes mellitus    type II   Hyperlipidemia    pt. states that hdl ldl was elevated denies hyperlipidemia   Hypertension    Hyperthyroidism    pt. denies having any thyroid issues   Personal history of colonic polyps - adenomas 08/06/2013   07/2013 - 3 diminutive adenomas - repeat colonoscopy 2018   Right hydrocele    S/P ACL repair    right    Past Surgical History:  Procedure Laterality Date   COLONOSCOPY     KNEE SURGERY  2002   right   POLYPECTOMY       Current Outpatient Medications  Medication Sig Dispense Refill   Ascorbic Acid (VITAMIN C PO) Take 1 tablet by mouth daily.      Boswellia-Glucosamine-Vit D (OSTEO BI-FLEX ONE PER DAY PO) Take 1 tablet by mouth daily.     ELIQUIS 5 MG TABS tablet TAKE ONE TABLET BY MOUTH TWICE A DAY 60 tablet 1   empagliflozin (JARDIANCE) 25 MG TABS tablet Take 1 tablet (25 mg total) by mouth daily before breakfast. 90 tablet 1   glipiZIDE (GLUCOTROL XL) 5 MG 24 hr tablet Take 1 tablet (5 mg total) by mouth daily with breakfast. (Patient taking differently: Take 5 mg by mouth at bedtime.) 90 tablet 3   losartan (COZAAR) 25 MG tablet Take 1 tablet (25 mg total) by mouth every evening. 30 tablet 2   metFORMIN (GLUCOPHAGE) 1000 MG tablet TAKE ONE TABLET BY MOUTH TWICE A DAY 180 tablet 3   metoprolol tartrate (LOPRESSOR) 50 MG tablet Take 1 tablet (50 mg total) by mouth 2 (two) times daily. 60 tablet 2   Multiple Vitamin (MULTIVITAMIN WITH MINERALS) TABS Take 1 tablet by mouth daily. Centrum     OneTouch Delica Lancets 53I MISC Use to check blood sugar 3 times a day. 300 each 12   ONETOUCH VERIO test strip USE TO TEST THREE TIMES A DAY 50 strip 3   rosuvastatin (CRESTOR) 10 MG tablet Take 10 mg by mouth at bedtime.     TRADJENTA 5 MG TABS tablet Take 5 mg by mouth daily.     No current facility-administered medications for this visit.    Allergies:  Patient has no known allergies.   Social History:  The patient  reports that he has never smoked. He has never used smokeless tobacco. He reports current alcohol use of about 2.0 standard drinks of alcohol per week. He reports that he does not use drugs.   Family History:  The patient's family history includes Alcohol abuse in an other family member; Diabetes in his brother, father, mother, sister, and another family member; Hypertension in an other family member.    ROS:  Please see the history of present illness.   Otherwise, review of systems is positive for none.   All other systems are reviewed and negative.    PHYSICAL EXAM: VS:  BP 138/76   Pulse (!) 55   Ht '5\' 8"'$  (1.727 m)   Wt  152 lb (68.9 kg)   SpO2 99%   BMI 23.11 kg/m  , BMI Body mass index is 23.11 kg/m. GEN: Well nourished, well developed, in no acute distress  HEENT: normal  Neck: no JVD, carotid bruits, or masses Cardiac: RRR; no murmurs, rubs, or gallops,no edema  Respiratory:  clear to auscultation bilaterally, normal work of breathing GI: soft, nontender, nondistended, + BS MS: no deformity or atrophy  Skin: warm and dry Neuro:  Strength and sensation are intact Psych: euthymic mood, full affect  EKG:  EKG is ordered today. Personal review of the ekg ordered shows sinus rhythm, rate 55  Recent Labs: 07/15/2021: BUN 14; Creatinine, Ser 0.90; Hemoglobin 14.3; Magnesium 1.9; Platelets 279; Potassium 3.6; Sodium 137    Lipid Panel     Component Value Date/Time   CHOL 163 01/16/2018 1008   TRIG 165.0 (H) 01/16/2018 1008   HDL 27.00 (L) 01/16/2018 1008   CHOLHDL 6 01/16/2018 1008   VLDL 33.0 01/16/2018 1008   LDLCALC 103 (H) 01/16/2018 1008   LDLDIRECT 96.9 11/07/2012 0806     Wt Readings from Last 3 Encounters:  10/25/21 152 lb (68.9 kg)  09/09/21 154 lb (69.9 kg)  09/07/21 151 lb (68.5 kg)      Other studies Reviewed: Additional studies/ records that were reviewed today include: TTE 07/25/21  Review of the above records today demonstrates:  Normal LV systolic function with visual EF 60-65%. Left ventricle cavity  is normal in size. Mild concentric hypertrophy of the left ventricle.  Normal global wall motion. Unable to evaluate diastolic function due to  underlying rhythm; but filling pattern suggestive of normal LAP.  No significant valvular heart disease.   Cardiac monitor 10/08/2021 personally reviewed Predominant rhythm is normal sinus rhythm.  There are brief atrial tachycardia episodes.  Atrial fibrillation occurred with a heart rate ranging from 51 bpm to 193 bpm with average of 101 bpm.  Longest atrial fibrillation was 20 hours and 13 minutes with average heart rate of 97 bpm.   This constituted 28% atrial fibrillation burden.  1 patient triggered event which correlated with PACs.  There was no heart block, significant ventricular arrhythmias. There were rare and PVCs.  ASSESSMENT AND PLAN:  1.  Paroxysmal atrial fibrillation: Has a 28% burden on cardiac monitor.  Currently on Eliquis 5 mg twice daily.  CHA2DS2-VASc of 2.  He would prefer to avoid long-term medications for his atrial fibrillation.  Due to that, we Boston Catarino plan for ablation.  Risk and benefits of been discussed.  He understands these risks and is agreed to the procedure.  Risk, benefits, and alternatives to EP study and radiofrequency ablation for afib were also discussed in detail  today. These risks include but are not limited to stroke, bleeding, vascular damage, tamponade, perforation, damage to the esophagus, lungs, and other structures, pulmonary vein stenosis, worsening renal function, and death. The patient understands these risk and wishes to proceed.  We Saniah Schroeter therefore proceed with catheter ablation at the next available time.  Carto, ICE, anesthesia are requested for the procedure.  Verta Riedlinger also obtain CT PV protocol prior to the procedure to exclude LAA thrombus and further evaluate atrial anatomy.    Case discussed with primary cardiology  Current medicines are reviewed at length with the patient today.   The patient does not have concerns regarding his medicines.  The following changes were made today:  none  Labs/ tests ordered today include:  Orders Placed This Encounter  Procedures   CT CARDIAC MORPH/PULM VEIN W/CM&W/O CA SCORE   Basic metabolic panel   CBC   EKG 12-Lead     Disposition:   FU with Dakari Stabler 3 months  Signed, Marylouise Mallet Meredith Leeds, MD  10/25/2021 2:24 PM     [place] [address] [place] [address] [phone] (office) [phone] (fax)

## 2021-10-26 ENCOUNTER — Ambulatory Visit: Payer: BC Managed Care – PPO | Admitting: Neurology

## 2021-11-03 ENCOUNTER — Other Ambulatory Visit: Payer: Self-pay | Admitting: Cardiology

## 2021-11-03 DIAGNOSIS — I480 Paroxysmal atrial fibrillation: Secondary | ICD-10-CM

## 2021-11-08 NOTE — Telephone Encounter (Signed)
[employer]- [employer] Josem Kaufmann: [id number] (exp. 10/25/21 to 01/25/22).   Patient is scheduled at [facility] for 11/28/21 at 9 pm. I mailed packet to the patient.

## 2021-11-11 ENCOUNTER — Other Ambulatory Visit: Payer: Self-pay

## 2021-11-11 MED ORDER — APIXABAN 5 MG PO TABS: 5.0000 mg | ORAL_TABLET | Freq: Two times a day (BID) | ORAL | 1 refills | Status: DC

## 2021-11-22 ENCOUNTER — Other Ambulatory Visit: Payer: Self-pay

## 2021-11-22 DIAGNOSIS — E119 Type 2 diabetes mellitus without complications: Secondary | ICD-10-CM

## 2021-11-22 MED ORDER — GLIPIZIDE ER 5 MG PO TB24: 5.0000 mg | ORAL_TABLET | Freq: Every day | ORAL | 3 refills | Status: DC

## 2021-11-28 ENCOUNTER — Ambulatory Visit (INDEPENDENT_AMBULATORY_CARE_PROVIDER_SITE_OTHER): Payer: 59 | Admitting: Neurology

## 2021-11-28 DIAGNOSIS — G4733 Obstructive sleep apnea (adult) (pediatric): Secondary | ICD-10-CM | POA: Diagnosis not present

## 2021-11-28 DIAGNOSIS — E1169 Type 2 diabetes mellitus with other specified complication: Secondary | ICD-10-CM

## 2021-11-28 DIAGNOSIS — I480 Paroxysmal atrial fibrillation: Secondary | ICD-10-CM

## 2021-12-10 ENCOUNTER — Encounter: Payer: Self-pay | Admitting: Cardiology

## 2021-12-10 ENCOUNTER — Ambulatory Visit: Payer: 59 | Admitting: Cardiology

## 2021-12-10 VITALS — BP 140/75 | HR 66 | Temp 98.7°F | Resp 16 | Ht 68.0 in | Wt 152.2 lb

## 2021-12-10 DIAGNOSIS — I480 Paroxysmal atrial fibrillation: Secondary | ICD-10-CM

## 2021-12-10 MED ORDER — MULTAQ 400 MG PO TABS: 400.0000 mg | ORAL_TABLET | Freq: Two times a day (BID) | ORAL | 2 refills | Status: DC

## 2021-12-10 NOTE — Progress Notes (Unsigned)
Primary Physician/Referring:  Prince Solian, MD  Patient ID: William Nixon, male    DOB: 1960-06-17, 61 y.o.   MRN: [id number]  No chief complaint on file.  HPI:    William Nixon  is a 61 y.o. Asian [place] male patient.  Pneumonitis, hyperlipidemia, who I had seen for a brief episode of atrial fibrillation in September 2022 and has been on CPAP since then with no recurrence.  He presented to emergency room on 07/15/2021 with syncope, new onset atrial fibrillation with RVR.  Syncope was felt to be due to vasovagal episode and hypotension.  He was back in sinus rhythm upon presentation to the emergency room.  She presents for routine follow-up, remains asymptomatic and is accompanied by his wife.   Past Medical History:  Diagnosis Date   Diabetes mellitus    type II   Hyperlipidemia    pt. states that hdl ldl was elevated denies hyperlipidemia   Hypertension    Hyperthyroidism    pt. denies having any thyroid issues   Personal history of colonic polyps - adenomas 08/06/2013   07/2013 - 3 diminutive adenomas - repeat colonoscopy 2018   Right hydrocele    S/P ACL repair    right    Past Surgical History:  Procedure Laterality Date   COLONOSCOPY     KNEE SURGERY  2002   right   POLYPECTOMY     Family History  Problem Relation Age of Onset   Diabetes Mother    Diabetes Father    Diabetes Sister    Diabetes Brother    Alcohol abuse Other    Diabetes Other    Hypertension Other     Social History   Tobacco Use   Smoking status: Never   Smokeless tobacco: Never  Substance Use Topics   Alcohol use: Yes    Alcohol/week: 2.0 standard drinks of alcohol    Types: 2 Standard drinks or equivalent per week    Comment: occasionally   Marital Status: Married  ROS  Review of Systems  Cardiovascular:  Negative for chest pain, dyspnea on exertion and leg swelling.   Objective  Blood pressure (!) 144/75, pulse 66, temperature 98.7 F (37.1 C), temperature source  Temporal, resp. rate 16, height '5\' 8"'$  (1.727 m), weight 152 lb 3.2 oz (69 kg), SpO2 98 %. Body mass index is 23.14 kg/m.     12/10/2021   11:53 AM 10/25/2021   12:05 PM 09/09/2021    2:24 PM  Vitals with BMI  Height '5\' 8"'$  '5\' 8"'$  '5\' 8"'$   Weight 152 lbs 3 oz 152 lbs 154 lbs  BMI 23.15 78.58 85.02  Systolic 774 128 786  Diastolic 75 76 77  Pulse 66 55 60    Physical Exam Neck:     Vascular: No JVD.  Cardiovascular:     Rate and Rhythm: Normal rate and regular rhythm.     Pulses: Intact distal pulses.     Heart sounds: Normal heart sounds. No murmur heard.    No gallop.  Pulmonary:     Effort: Pulmonary effort is normal.     Breath sounds: Normal breath sounds.  Abdominal:     General: Bowel sounds are normal.     Palpations: Abdomen is soft.  Musculoskeletal:     Right lower leg: No edema.     Left lower leg: No edema.    Medications and allergies  No Known Allergies   Medication list after today's encounter  Current Outpatient Medications:    apixaban (ELIQUIS) 5 MG TABS tablet, Take 1 tablet (5 mg total) by mouth 2 (two) times daily., Disp: 60 tablet, Rfl: 1   Ascorbic Acid (VITAMIN C PO), Take 1 tablet by mouth daily., Disp: , Rfl:    Boswellia-Glucosamine-Vit D (OSTEO BI-FLEX ONE PER DAY PO), Take 1 tablet by mouth daily., Disp: , Rfl:    empagliflozin (JARDIANCE) 25 MG TABS tablet, Take 1 tablet (25 mg total) by mouth daily before breakfast., Disp: 90 tablet, Rfl: 1   glipiZIDE (GLUCOTROL XL) 5 MG 24 hr tablet, Take 1 tablet (5 mg total) by mouth daily with breakfast., Disp: 90 tablet, Rfl: 3   glipiZIDE (GLUCOTROL) 5 MG tablet, Take 1 tablet by mouth daily., Disp: , Rfl:    losartan (COZAAR) 25 MG tablet, Take 1 tablet (25 mg total) by mouth every evening., Disp: 30 tablet, Rfl: 2   metFORMIN (GLUCOPHAGE) 1000 MG tablet, TAKE ONE TABLET BY MOUTH TWICE A DAY, Disp: 180 tablet, Rfl: 3   metoprolol tartrate (LOPRESSOR) 50 MG tablet, TAKE ONE TABLET BY MOUTH TWICE A DAY,  Disp: 60 tablet, Rfl: 2   Multiple Vitamin (MULTIVITAMIN WITH MINERALS) TABS, Take 1 tablet by mouth daily. Centrum, Disp: , Rfl:    OneTouch Delica Lancets 00L MISC, Use to check blood sugar 3 times a day., Disp: 300 each, Rfl: 12   ONETOUCH VERIO test strip, USE TO TEST THREE TIMES A DAY, Disp: 50 strip, Rfl: 3   rosuvastatin (CRESTOR) 10 MG tablet, Take 10 mg by mouth at bedtime., Disp: , Rfl:    TRADJENTA 5 MG TABS tablet, Take 5 mg by mouth daily., Disp: , Rfl:   Laboratory examination:   Recent Labs    07/15/21 0849  NA 137  K 3.6  CL 105  CO2 22  GLUCOSE 228*  BUN 14  CREATININE 0.90  CALCIUM 8.7*  GFRNONAA >60    CrCl cannot be calculated (Patient's most recent lab result is older than the maximum 21 days allowed.).     Latest Ref Rng & Units 07/15/2021    8:49 AM 01/16/2018   10:08 AM 01/10/2017    9:59 AM  CMP  Glucose 70 - 99 mg/dL 228  134  121   BUN 6 - 20 mg/dL '14  15  10   '$ Creatinine 0.61 - 1.24 mg/dL 0.90  0.88  0.78   Sodium 135 - 145 mmol/L 137  139  138   Potassium 3.5 - 5.1 mmol/L 3.6  4.4  4.1   Chloride 98 - 111 mmol/L 105  102  100   CO2 22 - 32 mmol/L '22  29  31   '$ Calcium 8.9 - 10.3 mg/dL 8.7  9.3  9.5   Total Protein 6.0 - 8.3 g/dL  7.3  7.2   Total Bilirubin 0.2 - 1.2 mg/dL  0.7  0.9   Alkaline Phos 39 - 117 U/L  68  65   AST 0 - 37 U/L  25  23   ALT 0 - 53 U/L  28  24       Latest Ref Rng & Units 07/15/2021    8:49 AM 01/16/2018   10:08 AM 01/10/2017    9:59 AM  CBC  WBC 4.0 - 10.5 K/uL 10.3  3.6  5.8   Hemoglobin 13.0 - 17.0 g/dL 14.3  15.5  15.6   Hematocrit 39.0 - 52.0 % 42.9  44.2  45.9   Platelets 150 -  400 K/uL 279  136.0  148.0     HEMOGLOBIN A1C Lab Results  Component Value Date   HGBA1C 6.7 (A) 09/07/2021   External labs:   Cholesterol, total 124.000 m 01/21/2021 HDL 24.000 mg 01/21/2021 LDL 65.000 mg 01/21/2021 Triglycerides 174.000 m 01/21/2021  TSH 3.500 01/21/2021  Radiology:    Cardiac Studies:   PCV  ECHOCARDIOGRAM COMPLETE 07/22/2021  Normal LV systolic function with visual EF 60-65%. Left ventricle cavity is normal in size. Mild concentric hypertrophy of the left ventricle. Normal global wall motion. Unable to evaluate diastolic function due to underlying rhythm; but filling pattern suggestive of normal LAP. No significant valvular heart disease. Compared to study 04/09/2019 no significant change.  PCV MYOCARDIAL PERFUSION WO LEXISCAN 03/20/2019  No previous exam available for comparison. Lexiscan nuclear stress test performed using 1-day protocol. Myocardial perfusion imaging is normal. Left ventricular ejection fraction is  60% with normal wall motion. Low risk study.  Mobile cardiac telemetry 13 days 07/22/2021 - 08/05/2021: Dominant rhythm: Sinus. HR 49-145 bpm. Avg HR 78 bpm, in sinus rhythm. Atrial fibrillation occurred (28% burden), ventricular rate 54-188 bpm, avg 100 bpm. 1 episode of atrial tachcyardia at 143 bpm for 12 beats. <1% isolated SVE, couplet/triplets. 0 episodes of VT. <1% isolated VE, couplets. No atrial fibrillationVT/high grade AV block, sinus pause >3sec noted. 0 patient triggered events.   EKG:   EKG 09/09/2021: Sinus bradycardia at rate of 58 bpm, LAE, incomplete right bundle branch block, nonspecific T abnormality.  Normal QT interval.  No significant change from 08/12/2021.   Assessment   No diagnosis found.   CHA2DS2-VASc Score is 2.  Yearly risk of stroke: 2.3% (HTN, DM).  Score of 1=0.6; 2=2.2; 3=3.2; 4=4.8; 5=7.2; 6=9.8; 7=>9.8) -(CHF; HTN; vasc disease DM,  Male = 1; Age <65 =0; 65-74 = 1,  >75 =2; stroke/embolism= 2).    There are no discontinued medications.   No orders of the defined types were placed in this encounter.  No orders of the defined types were placed in this encounter.  Recommendations:   CONALL VANGORDER is a 61 y.o.  Asian [place] male patient with diabetes mellitus, hyperlipidemia, who I had seen for a brief episode of  atrial fibrillation in September 2022 and has been on CPAP since then with no recurrence.  He presented to emergency room on 07/15/2021 with syncope, new onset atrial fibrillation with RVR. Syncope felt to be due to dehydration vs vasovagal.  Negative CT angiogram of the chest for PE done for elevated D-dimer. He was back in sinus rhythm upon presentation to the emergency room.  He is presently asymptomatic, I reviewed his extended EKG monitoring, patient has 28% atrial fibrillation burden, has several episodes of A-fib with RVR but on questioning, patient has still been running anywhere from 3 to 4 miles.  I suspect his A-fib with RVR was related to patient exercising during those times.  At other times A-fib has been rate controlled.  On his last office visit I had switched him from diltiazem to metoprolol to tartrate 50 mg twice daily which she is tolerating, although he has a smart watch, appropriate EKG monitoring has not been performed to evaluate for atrial fibrillation burden.  I discussed with the patient and his wife lab most probably metoprolol will not change atrial fibrillation burden and given his relatively young age, I would recommend consideration for atrial fibrillation ablation although he is asymptomatic to reduce potential complications in future including heart failure, persistent atrial fibrillation  leading to symptoms.  He is also fairly active and likes to run, usually the heart rate is around 120-130 but he has recorded heart rates all the way up to 150-160 recently.  There would like to repeat event monitoring for 2 weeks to evaluate for atrial fibrillation burden.  I have made referral for EP for consideration of ablation, I have also discussed with him regarding potential for use of antiarrhythmic therapy I am concerned about underlying bradycardia and potential for side effects including dizziness and hypotension and reduced exercise capacity and the patient would like to run  AAD.  I will see him back in 3 months for follow-up.  He does have severe obstructive sleep apnea and he is compliant with CPAP.    Adrian Prows, MD, [facility] 12/10/2021, 12:05 PM Office: [phone]

## 2021-12-12 ENCOUNTER — Encounter: Payer: Self-pay | Admitting: Neurology

## 2021-12-13 DIAGNOSIS — E119 Type 2 diabetes mellitus without complications: Secondary | ICD-10-CM | POA: Insufficient documentation

## 2021-12-13 NOTE — Telephone Encounter (Signed)
I read his study today- sorry for the delay due to our new soft ware. There was atrial fib seen and mild apnea overall, moderate apnea in REM sleep and also in supine sleep. Recommend to continue CPAP. No hypoxia. THIS WAS IN LAB PSG, NOT HST.

## 2021-12-13 NOTE — Procedures (Signed)
[facility] POLYSOMNOGRAPHY  INTERPRETATION REPORT   STUDY DATE:  11/28/2021     PATIENT NAME:  William Nixon         DATE OF BIRTH:  1960/09/03  PATIENT ID:  [id number]    TYPE OF STUDY:  PSG  READING PHYSICIAN: Larey Seat, MD REFERRED BY: Adrian Prows, MD - Cardiology and Berneta Sages, MD, PhD, his primary Internist.  SCORING TECHNICIAN: Forde Radon, RPSGT   INDICATIONS: This 61 year-old Male patient uses CPAP. Dr. Einar Gip originally referred for sleep study-at the time with new onset atrial fibrillation, the patient has 20 year- history of DM, On CPAP now: Here for yearly CPAP f/u. Pt reports doing well on CPAP. William Nixon,  is a 61 year- old  male patient of [place] descent , and seen in a RV on?08/23/2021. Dr. Einar Gip saw him on 08-12-2021: after a syncope and low BP, dehydrated and was on decongestants. He was interested in hearing about INSPIRE; He underwent sleep testing here last year -his sleep test actually was performed on 07/24/2019 he had a moderate degree of sleep apnea obstructive with an AHI of 19.6 some snoring was recorded no REM sleep accentuation was noted. ?There was no note of hypoxemia. So this patient could potentially be a candidate for inspire device. Looking at his current compliance. In spite of not exactly loving his CPAP he has been a highly compliant user - use of  the machine 27 out of 30 days with a compliance and hours of 93%. Average user time is 6 hours and 46 minutes and he fulfills all insurance-compliance data.  His machine was set up on 05-22-2019. It is an Marketing executive Sense 10 with a minimum pressure of 5 and a maximum pressure of 16 cm water as well as 3 cm EPR level. ?The residual apnea is 0 point 0.8/h which is a good resolution of apnea. 95th percentile pressure is 11 cmH2O well within the current settings. Air leak is low at the 95th percentile leaks only 1.2 L a minute that is an excellent fit in terms of mask. I would  hesitate to recommend an alternative therapy because this 1 seems to work so well. The patient states that when he had his syncope spell it was also discovered that he still was in atrial fibrillation. He has not been undergoing cardiac ablation or cardiac "reset', just beta blocker rate controlled.  Inspire works in non-REM sleep apnea without hypoxia. He is less interested in a surgical procedure.? The Epworth Sleepiness Scale was 13 out of 24 (scores above or equal to 10 are suggestive of hypersomnolence).  CLINICAL INFORMATION:  Height: 68 in Weight: 151 lb (BMI 22) Neck Size: 0 in. MEDICATION: Eliquis, Vitamin C, Osteo Bi-Flex, Cromolyn Sodium, Jardiance, Glucotrol, Tradjenta, Cozaar, Glucophage, Lopressor, Multivitamin, Crestor   DESCRIPTION: A RPSGT sleep technologist was in attendance for the duration of the recording.  Data collection, scoring, video monitoring, and reporting were performed in compliance with the AASM Manual for the Scoring of Sleep and Associated Events; (Hypopnea is scored based on the criteria listed in Section VIII D. 1b in the AASM Manual V2.6 using a 4% oxygen desaturation rule or Hypopnea is scored based on the criteria listed in Section VIII D. 1a in the AASM Manual V2.6 using 3% oxygen desaturation and /or arousal rule).    SLEEP CONTINUITY AND SLEEP ARCHITECTURE:  Lights off was at 21:41: and lights on 05:07: (7.4 hours in bed). Total sleep time  was 378.0 minutes (13.5% supine;  86.5% lateral;  0% prone, 25.7% REM sleep), with a decreased sleep efficiency at 84.8%.  Sleep latency was  brief at 13.0 minutes.  REM sleep latency was normal at 50.5 minutes.  Of the total sleep time, the percentage of stage N1 sleep was 3.6%, stage N2 sleep was 71%, stage N3 sleep was 0.0%, and REM sleep was 25.7%. There were 5 Stage R periods observed on this study night, 13 awakenings (i.e. transitions to Stage W from any sleep stage), and 40 total stage transitions. Wake after sleep onset  (WASO) time accounted for 55 minutes.  BODY POSITION: Duration of total sleep and percent of total sleep in their respective position is as follows: supine sleep was seen for 51 minutes (13%), non-supine sleep position totally was seen over 327 minutes (87%); divided further  into sleep positions:  right lateral for 216 minutes (57%), left sided for 111 minutes (29%), and prone 00 minutes (0%).  Total supine REM sleep time was 00 minutes (0% of total REM sleep). RESPIRATORY MONITORING: Based on AASM criteria (using a 3% oxygen desaturation and /or arousal rule for scoring hypopneas), there were 2 apneas (2 obstructive; 0 central; 0 mixed), and 62 hypopneas. Apnea index was 0.3/h. Hypopnea index was 9.8/h. The apnea-hypopnea index was 10.2/h overall; The AHI was 22.4/h in  supine, 18/h non-supine; AHI was 17.9/h during non supine REM, 0.0/h supine REM.  OXIMETRY: Total sleep time spent at, or below 88% was 0.4 minutes, or 0.1% of total sleep time. Respiratory events were associated with oxyhemoglobin desaturations (nadir during sleep 83% from a mean of 96%). Snoring was classified as mild. LIMB MOVEMENTS: There were 0 periodic limb movements of sleep (0.0/hr). AROUSAL: There were 16 arousals in total, for an arousal index of 3 arousals/hour.  Of these, 6 were identified as respiratory-related arousals (1 /h), 0 were PLM-related arousals (0 /h), and 18 were non-specific arousals (3 /h)  EEG:  Normal amplitude and frequency without asymmetry or epileptiform d/c EKG: The electrocardiogram documented an average heart rate during sleep at 62 bpm.  The minimum heart rate was 52 bpm and the  maximum heart rate during sleep was 80 bpm. The maximum heart rate during recording was 85 bpm- irregular heart rate with variable R to R intervals, atrial fibrillation was rate controlled. Warden Fillers &VIDEO:  NO vocalization or complex movements during sleep were captured.    IMPRESSION:   this patient presented with mild  overall apnea , obstructive sleep apnea, associated with oxygen desaturation to a nadir at 83% for a total time in hypoxia of  0.4 minutes only.   Respiratory events worsened while the patient slept supine or while REM sleep, but supine REM sleep was not seen. REM sleep had much higher AHI than non REM sleep.      No clinically significant periodic  limb movements (PLMs) were observed. EKG showed Atrial fibrillation.   RECOMMENDATIONS:  The patient should continue to use his CPAP and avoid supine sleep if possible.  He remains in atrial fibrillation and continued use should protect him for reconverting into atrial fibrillation after future ablation or cardioversion.  The patient could consider the Inspire device, but his AHI is overall to mild to justify this therapy.    Larey Seat,  MD

## 2022-01-02 ENCOUNTER — Encounter: Payer: Self-pay | Admitting: Cardiology

## 2022-01-09 ENCOUNTER — Other Ambulatory Visit: Payer: Self-pay | Admitting: Cardiology

## 2022-01-31 ENCOUNTER — Other Ambulatory Visit: Payer: Self-pay | Admitting: Cardiology

## 2022-01-31 DIAGNOSIS — I480 Paroxysmal atrial fibrillation: Secondary | ICD-10-CM

## 2022-02-02 ENCOUNTER — Other Ambulatory Visit: Payer: Self-pay

## 2022-02-02 DIAGNOSIS — I480 Paroxysmal atrial fibrillation: Secondary | ICD-10-CM

## 2022-02-02 MED ORDER — APIXABAN 5 MG PO TABS: 5.0000 mg | ORAL_TABLET | Freq: Two times a day (BID) | ORAL | 2 refills | Status: DC

## 2022-02-02 MED ORDER — METOPROLOL TARTRATE 50 MG PO TABS: 50.0000 mg | ORAL_TABLET | Freq: Two times a day (BID) | ORAL | 2 refills | Status: DC

## 2022-02-03 ENCOUNTER — Other Ambulatory Visit: Payer: Self-pay | Admitting: Internal Medicine

## 2022-02-04 ENCOUNTER — Encounter: Payer: Self-pay | Admitting: Internal Medicine

## 2022-02-17 ENCOUNTER — Other Ambulatory Visit: Payer: Self-pay | Admitting: Cardiology

## 2022-02-17 DIAGNOSIS — I480 Paroxysmal atrial fibrillation: Secondary | ICD-10-CM

## 2022-02-23 ENCOUNTER — Encounter: Payer: Self-pay | Admitting: Internal Medicine

## 2022-02-26 ENCOUNTER — Other Ambulatory Visit: Payer: Self-pay | Admitting: Internal Medicine

## 2022-03-22 ENCOUNTER — Ambulatory Visit: Payer: 59 | Attending: Cardiology

## 2022-03-22 ENCOUNTER — Encounter: Payer: Self-pay | Admitting: Internal Medicine

## 2022-03-22 DIAGNOSIS — I4819 Other persistent atrial fibrillation: Secondary | ICD-10-CM

## 2022-03-22 DIAGNOSIS — Z01812 Encounter for preprocedural laboratory examination: Secondary | ICD-10-CM

## 2022-03-22 LAB — CBC
Hematocrit: 43.9 % (ref 37.5–51.0)
Hemoglobin: 14.8 g/dL (ref 13.0–17.7)
MCH: 30.3 pg (ref 26.6–33.0)
MCHC: 33.7 g/dL (ref 31.5–35.7)
MCV: 90 fL (ref 79–97)
Platelets: 176 10*3/uL (ref 150–450)
RBC: 4.89 x10E6/uL (ref 4.14–5.80)
RDW: 15.2 % (ref 11.6–15.4)
WBC: 6.7 10*3/uL (ref 3.4–10.8)

## 2022-03-22 LAB — BASIC METABOLIC PANEL
BUN/Creatinine Ratio: 18 (ref 10–24)
BUN: 14 mg/dL (ref 8–27)
CO2: 27 mmol/L (ref 20–29)
Calcium: 10 mg/dL (ref 8.6–10.2)
Chloride: 101 mmol/L (ref 96–106)
Creatinine, Ser: 0.76 mg/dL (ref 0.76–1.27)
Glucose: 156 mg/dL — ABNORMAL HIGH (ref 70–99)
Potassium: 3.9 mmol/L (ref 3.5–5.2)
Sodium: 139 mmol/L (ref 134–144)
eGFR: 103 mL/min/{1.73_m2} (ref >59)

## 2022-03-28 ENCOUNTER — Telehealth (HOSPITAL_COMMUNITY): Payer: Self-pay | Admitting: Emergency Medicine

## 2022-03-28 ENCOUNTER — Ambulatory Visit (HOSPITAL_BASED_OUTPATIENT_CLINIC_OR_DEPARTMENT_OTHER): Payer: 59

## 2022-03-28 NOTE — Telephone Encounter (Signed)
Attempted to call patient regarding upcoming cardiac CT appointment. °Left message on voicemail with name and callback number °Jeniah Kishi RN Navigator Cardiac Imaging °[facility] °[phone] Office °[phone] Cell ° °

## 2022-03-29 ENCOUNTER — Encounter (HOSPITAL_BASED_OUTPATIENT_CLINIC_OR_DEPARTMENT_OTHER): Payer: Self-pay

## 2022-03-29 ENCOUNTER — Ambulatory Visit (HOSPITAL_BASED_OUTPATIENT_CLINIC_OR_DEPARTMENT_OTHER)
Admission: RE | Admit: 2022-03-29 | Discharge: 2022-03-29 | Disposition: A | Discharge status: Home / Self Care | Payer: 59 | Source: Ambulatory Visit | Attending: Cardiology | Admitting: Cardiology

## 2022-03-29 DIAGNOSIS — I4819 Other persistent atrial fibrillation: Secondary | ICD-10-CM

## 2022-03-29 MED ORDER — IOHEXOL 350 MG/ML SOLN
100.0000 mL | Freq: Once | INTRAVENOUS | Status: AC | PRN
  Administered 2022-03-29: 80 mL via INTRAVENOUS

## 2022-03-30 NOTE — Pre-Procedure Instructions (Signed)
Instructed patient on the following items: Arrival time 0530 Nothing to eat or drink after midnight No meds AM of procedure Responsible person to drive you home and stay with you for 24 hrs  Have you missed any doses of anti-coagulant Eliquis- hasn't missed any doses    

## 2022-03-31 ENCOUNTER — Encounter (HOSPITAL_COMMUNITY)
Admission: RE | Disposition: A | Discharge status: Home / Self Care | Payer: Self-pay | Source: Ambulatory Visit | Attending: Cardiology

## 2022-03-31 ENCOUNTER — Ambulatory Visit (HOSPITAL_BASED_OUTPATIENT_CLINIC_OR_DEPARTMENT_OTHER): Payer: 59 | Admitting: General Practice

## 2022-03-31 ENCOUNTER — Other Ambulatory Visit: Payer: Self-pay

## 2022-03-31 ENCOUNTER — Encounter (HOSPITAL_COMMUNITY): Payer: Self-pay | Admitting: Cardiology

## 2022-03-31 ENCOUNTER — Ambulatory Visit (HOSPITAL_COMMUNITY): Payer: 59 | Admitting: General Practice

## 2022-03-31 ENCOUNTER — Ambulatory Visit (HOSPITAL_COMMUNITY)
Admission: RE | Admit: 2022-03-31 | Discharge: 2022-03-31 | Disposition: A | Discharge status: Home / Self Care | Payer: 59 | Source: Ambulatory Visit | Attending: Cardiology | Admitting: Cardiology

## 2022-03-31 DIAGNOSIS — Z7984 Long term (current) use of oral hypoglycemic drugs: Secondary | ICD-10-CM

## 2022-03-31 DIAGNOSIS — E119 Type 2 diabetes mellitus without complications: Secondary | ICD-10-CM

## 2022-03-31 DIAGNOSIS — G4730 Sleep apnea, unspecified: Secondary | ICD-10-CM | POA: Insufficient documentation

## 2022-03-31 DIAGNOSIS — I10 Essential (primary) hypertension: Secondary | ICD-10-CM | POA: Diagnosis not present

## 2022-03-31 DIAGNOSIS — I4891 Unspecified atrial fibrillation: Secondary | ICD-10-CM

## 2022-03-31 DIAGNOSIS — E785 Hyperlipidemia, unspecified: Secondary | ICD-10-CM | POA: Diagnosis not present

## 2022-03-31 DIAGNOSIS — I480 Paroxysmal atrial fibrillation: Secondary | ICD-10-CM | POA: Insufficient documentation

## 2022-03-31 HISTORY — PX: ATRIAL FIBRILLATION ABLATION: EP1191

## 2022-03-31 LAB — GLUCOSE, CAPILLARY
Glucose-Capillary: 167 mg/dL — ABNORMAL HIGH (ref 70–99)
Glucose-Capillary: 169 mg/dL — ABNORMAL HIGH (ref 70–99)

## 2022-03-31 LAB — POCT ACTIVATED CLOTTING TIME
Activated Clotting Time: 282 seconds
Activated Clotting Time: 320 seconds

## 2022-03-31 SURGERY — ATRIAL FIBRILLATION ABLATION
Anesthesia: General

## 2022-03-31 MED ORDER — PROTAMINE SULFATE 10 MG/ML IV SOLN
INTRAVENOUS | Status: DC | PRN
  Administered 2022-03-31: 40 mg via INTRAVENOUS

## 2022-03-31 MED ORDER — SODIUM CHLORIDE 0.9 % IV SOLN: INTRAVENOUS | Status: DC

## 2022-03-31 MED ORDER — LIDOCAINE 2% (20 MG/ML) 5 ML SYRINGE
INTRAMUSCULAR | Status: DC | PRN
  Administered 2022-03-31: 60 mg via INTRAVENOUS

## 2022-03-31 MED ORDER — HEPARIN (PORCINE) IN NACL 1000-0.9 UT/500ML-% IV SOLN
INTRAVENOUS | Status: DC | PRN
  Administered 2022-03-31 (×3): 500 mL

## 2022-03-31 MED ORDER — HEPARIN SODIUM (PORCINE) 1000 UNIT/ML IJ SOLN
INTRAMUSCULAR | Status: AC
  Filled 2022-03-31: qty 10

## 2022-03-31 MED ORDER — SODIUM CHLORIDE 0.9% FLUSH: 3.0000 mL | INTRAVENOUS | Status: DC | PRN

## 2022-03-31 MED ORDER — SODIUM CHLORIDE 0.9 % IV SOLN: 250.0000 mL | INTRAVENOUS | Status: DC | PRN

## 2022-03-31 MED ORDER — ONDANSETRON HCL 4 MG/2ML IJ SOLN
INTRAMUSCULAR | Status: DC | PRN
  Administered 2022-03-31: 4 mg via INTRAVENOUS

## 2022-03-31 MED ORDER — HEPARIN SODIUM (PORCINE) 1000 UNIT/ML IJ SOLN
INTRAMUSCULAR | Status: DC | PRN
  Administered 2022-03-31: 1000 [IU] via INTRAVENOUS

## 2022-03-31 MED ORDER — PHENYLEPHRINE 80 MCG/ML (10ML) SYRINGE FOR IV PUSH (FOR BLOOD PRESSURE SUPPORT)
PREFILLED_SYRINGE | INTRAVENOUS | Status: DC | PRN
  Administered 2022-03-31 (×2): 80 ug via INTRAVENOUS
  Administered 2022-03-31: 160 ug via INTRAVENOUS
  Administered 2022-03-31: 80 ug via INTRAVENOUS

## 2022-03-31 MED ORDER — PROPOFOL 10 MG/ML IV BOLUS
INTRAVENOUS | Status: DC | PRN
  Administered 2022-03-31: 160 mg via INTRAVENOUS

## 2022-03-31 MED ORDER — FENTANYL CITRATE (PF) 250 MCG/5ML IJ SOLN
INTRAMUSCULAR | Status: DC | PRN
  Administered 2022-03-31: 100 ug via INTRAVENOUS

## 2022-03-31 MED ORDER — PHENYLEPHRINE HCL-NACL 20-0.9 MG/250ML-% IV SOLN
INTRAVENOUS | Status: DC | PRN
  Administered 2022-03-31: 20 ug/min via INTRAVENOUS

## 2022-03-31 MED ORDER — HEPARIN SODIUM (PORCINE) 1000 UNIT/ML IJ SOLN
INTRAMUSCULAR | Status: DC | PRN
  Administered 2022-03-31: 14000 [IU] via INTRAVENOUS
  Administered 2022-03-31: 5000 [IU] via INTRAVENOUS

## 2022-03-31 MED ORDER — HEPARIN (PORCINE) IN NACL 1000-0.9 UT/500ML-% IV SOLN
INTRAVENOUS | Status: AC
  Filled 2022-03-31: qty 500

## 2022-03-31 MED ORDER — SUGAMMADEX SODIUM 200 MG/2ML IV SOLN
INTRAVENOUS | Status: DC | PRN
  Administered 2022-03-31: 200 mg via INTRAVENOUS

## 2022-03-31 MED ORDER — DOBUTAMINE INFUSION FOR EP/ECHO/NUC (1000 MCG/ML)
INTRAVENOUS | Status: DC | PRN
  Administered 2022-03-31: 20 ug/kg/min via INTRAVENOUS

## 2022-03-31 MED ORDER — ROCURONIUM BROMIDE 10 MG/ML (PF) SYRINGE
PREFILLED_SYRINGE | INTRAVENOUS | Status: DC | PRN
  Administered 2022-03-31: 60 mg via INTRAVENOUS

## 2022-03-31 MED ORDER — DOBUTAMINE INFUSION FOR EP/ECHO/NUC (1000 MCG/ML)
INTRAVENOUS | Status: AC
  Filled 2022-03-31: qty 250

## 2022-03-31 MED ORDER — DEXAMETHASONE SODIUM PHOSPHATE 10 MG/ML IJ SOLN
INTRAMUSCULAR | Status: DC | PRN
  Administered 2022-03-31: 5 mg via INTRAVENOUS

## 2022-03-31 MED ORDER — MIDAZOLAM HCL 2 MG/2ML IJ SOLN
INTRAMUSCULAR | Status: DC | PRN
  Administered 2022-03-31: 2 mg via INTRAVENOUS

## 2022-03-31 MED ORDER — ONDANSETRON HCL 4 MG/2ML IJ SOLN: 4.0000 mg | Freq: Four times a day (QID) | INTRAMUSCULAR | Status: DC | PRN

## 2022-03-31 MED ORDER — ACETAMINOPHEN 325 MG PO TABS: 650.0000 mg | ORAL_TABLET | ORAL | Status: DC | PRN

## 2022-03-31 SURGICAL SUPPLY — 19 items
CATH 8FR REPROCESSED SOUNDSTAR (CATHETERS) ×1 IMPLANT
CATH 8FR SOUNDSTAR REPROCESSED (CATHETERS) IMPLANT
CATH ABLAT QDOT MICRO BI TC DF (CATHETERS) IMPLANT
CATH OCTARAY 2.0 F 3-3-3-3-3 (CATHETERS) IMPLANT
CATH PIGTAIL STEERABLE D1 8.7 (WIRE) IMPLANT
CATH S-M CIRCA TEMP PROBE (CATHETERS) IMPLANT
CATH WEBSTER BI DIR CS D-F CRV (CATHETERS) IMPLANT
CLOSURE PERCLOSE PROSTYLE (VASCULAR PRODUCTS) IMPLANT
PACK EP LATEX FREE (CUSTOM PROCEDURE TRAY) ×1
PACK EP LF (CUSTOM PROCEDURE TRAY) ×2 IMPLANT
PAD DEFIB RADIO PHYSIO CONN (PAD) ×2 IMPLANT
PATCH CARTO3 (PAD) IMPLANT
SHEATH CARTO VIZIGO MED CURVE (SHEATH) IMPLANT
SHEATH CARTO VIZIGO SM CVD (SHEATH) IMPLANT
SHEATH PINNACLE 7F 10CM (SHEATH) IMPLANT
SHEATH PINNACLE 8F 10CM (SHEATH) IMPLANT
SHEATH PINNACLE 9F 10CM (SHEATH) IMPLANT
SHEATH PROBE COVER 6X72 (BAG) IMPLANT
TUBING SMART ABLATE COOLFLOW (TUBING) IMPLANT

## 2022-03-31 NOTE — Transfer of Care (Signed)
Immediate Anesthesia Transfer of Care Note  Patient: William Nixon  Procedure(s) Performed: ATRIAL FIBRILLATION ABLATION  Patient Location: Cath Lab  Anesthesia Type:General  Level of Consciousness: awake and drowsy  Airway & Oxygen Therapy: Patient Spontanous Breathing and Patient connected to nasal cannula oxygen  Post-op Assessment: Report given to RN and Post -op Vital signs reviewed and stable  Post vital signs: Reviewed and stable  Last Vitals:  Vitals Value Taken Time  BP 100/58 03/31/22 0933  Temp    Pulse 76 03/31/22 0935  Resp 32 03/31/22 0935  SpO2 96 % 03/31/22 0935  Vitals shown include unvalidated device data.  Last Pain:  Vitals:   03/31/22 0554  TempSrc: Temporal  PainSc:          Complications: There were no known notable events for this encounter.

## 2022-03-31 NOTE — Progress Notes (Signed)
Patient and wife was given discharge instructions. Both verbalized understanding. 

## 2022-03-31 NOTE — H&P (Signed)
Electrophysiology Office Note   Date:  03/31/2022   ID:  [name], DOB 22-May-1960, MRN [id number]  PCP:  Prince Solian, MD  Cardiologist:  Einar Gip Primary Electrophysiologist:  Jameal Razzano Meredith Leeds, MD    Chief Complaint: AF   History of Present Illness: William Nixon is a 61 y.o. male who is being seen today for the evaluation of AF at the request of No ref. provider found. Presenting today for electrophysiology evaluation.  He has a history significant for type 2 diabetes, hyperlipidemia, hypertension.  He also has sleep apnea and has been using CPAP.  He presented the emergency room 07/15/2021 with syncope and new onset atrial fibrillation with rapid rates.  Syncope was felt due to a vasovagal episode.  He converted to sinus rhythm while in the emergency room.  He wore a cardiac monitor that showed episodes of rapid atrial fibrillation with a burden of 28%.  He does not have much in the way of symptoms, but would prefer to prevent further episodes of atrial fibrillation.  Today, denies symptoms of palpitations, chest pain, shortness of breath, orthopnea, PND, lower extremity edema, claudication, dizziness, presyncope, syncope, bleeding, or neurologic sequela. The patient is tolerating medications without difficulties. Plan ablation today.     Past Medical History:  Diagnosis Date   Diabetes mellitus    type II   Hyperlipidemia    pt. states that hdl ldl was elevated denies hyperlipidemia   Hypertension    Hyperthyroidism    pt. denies having any thyroid issues   Personal history of colonic polyps - adenomas 08/06/2013   07/2013 - 3 diminutive adenomas - repeat colonoscopy 2018   Right hydrocele    S/P ACL repair    right    Past Surgical History:  Procedure Laterality Date   COLONOSCOPY     KNEE SURGERY  2002   right   POLYPECTOMY       Current Facility-Administered Medications  Medication Dose Route Frequency Provider Last Rate Last Admin   0.9 %   sodium chloride infusion   Intravenous Continuous Constance Haw, MD 50 mL/hr at 03/31/22 0657 Continued from Pre-op at 03/31/22 2263    Allergies:   Beef-derived products   Social History:  The patient  reports that he has never smoked. He has never used smokeless tobacco. He reports current alcohol use of about 2.0 standard drinks of alcohol per week. He reports that he does not use drugs.   Family History:  The patient's family history includes Alcohol abuse in an other family member; Diabetes in his brother, father, mother, sister, and another family member; Hypertension in an other family member.   ROS:  Please see the history of present illness.   Otherwise, review of systems is positive for none.   All other systems are reviewed and negative.   PHYSICAL EXAM: VS:  BP 131/72   Pulse 62   Temp 97.9 F (36.6 C) (Temporal)   Resp 17   Ht '5\' 8"'$  (1.727 m)   Wt 68 kg   SpO2 98%   BMI 22.81 kg/m  , BMI Body mass index is 22.81 kg/m. GEN: Well nourished, well developed, in no acute distress  HEENT: normal  Neck: no JVD, carotid bruits, or masses Cardiac: RRR; no murmurs, rubs, or gallops,no edema  Respiratory:  clear to auscultation bilaterally, normal work of breathing GI: soft, nontender, nondistended, + BS MS: no deformity or atrophy  Skin: warm and dry Neuro:  Strength and sensation  are intact Psych: euthymic mood, full affect  Recent Labs: 07/15/2021: Magnesium 1.9 03/22/2022: BUN 14; Creatinine, Ser 0.76; Hemoglobin 14.8; Platelets 176; Potassium 3.9; Sodium 139    Lipid Panel     Component Value Date/Time   CHOL 163 01/16/2018 1008   TRIG 165.0 (H) 01/16/2018 1008   HDL 27.00 (L) 01/16/2018 1008   CHOLHDL 6 01/16/2018 1008   VLDL 33.0 01/16/2018 1008   LDLCALC 103 (H) 01/16/2018 1008   LDLDIRECT 96.9 11/07/2012 0806     Wt Readings from Last 3 Encounters:  03/31/22 68 kg  12/10/21 69 kg  10/25/21 68.9 kg      Other studies Reviewed: Additional  studies/ records that were reviewed today include: TTE 07/25/21  Review of the above records today demonstrates:  Normal LV systolic function with visual EF 60-65%. Left ventricle cavity  is normal in size. Mild concentric hypertrophy of the left ventricle.  Normal global wall motion. Unable to evaluate diastolic function due to  underlying rhythm; but filling pattern suggestive of normal LAP.  No significant valvular heart disease.   Cardiac monitor 10/08/2021 personally reviewed Predominant rhythm is normal sinus rhythm.  There are brief atrial tachycardia episodes.  Atrial fibrillation occurred with a heart rate ranging from 51 bpm to 193 bpm with average of 101 bpm.  Longest atrial fibrillation was 20 hours and 13 minutes with average heart rate of 97 bpm.  This constituted 28% atrial fibrillation burden.  1 patient triggered event which correlated with PACs.  There was no heart block, significant ventricular arrhythmias. There were rare and PVCs.  ASSESSMENT AND PLAN:  1.  Paroxysmal atrial fibrillation: William Nixon has presented today for surgery, with the diagnosis of AF.  The various methods of treatment have been discussed with the patient and family. After consideration of risks, benefits and other options for treatment, the patient has consented to  Procedure(s): Catheter ablation as a surgical intervention .  Risks include but not limited to complete heart block, stroke, esophageal damage, nerve damage, bleeding, vascular damage, tamponade, perforation, MI, and death. The patient's history has been reviewed, patient examined, no change in status, stable for surgery.  I have reviewed the patient's chart and labs.  Questions were answered to the patient's satisfaction.    Elektra Wartman Curt Bears, MD 03/31/2022 7:06 AM

## 2022-03-31 NOTE — Discharge Instructions (Signed)

## 2022-03-31 NOTE — Anesthesia Postprocedure Evaluation (Signed)
Anesthesia Post Note  Patient: William Nixon  Procedure(s) Performed: ATRIAL FIBRILLATION ABLATION     Patient location during evaluation: PACU Anesthesia Type: General Level of consciousness: awake and alert Pain management: pain level controlled Vital Signs Assessment: post-procedure vital signs reviewed and stable Respiratory status: spontaneous breathing, nonlabored ventilation, respiratory function stable and patient connected to nasal cannula oxygen Cardiovascular status: blood pressure returned to baseline and stable Postop Assessment: no apparent nausea or vomiting Anesthetic complications: no   There were no known notable events for this encounter.  Last Vitals:  Vitals:   03/31/22 1010 03/31/22 1015  BP: (!) 92/50 (!) 94/50  Pulse: 73 73  Resp: (!) 23 (!) 23  Temp:    SpO2: 95% 95%    Last Pain:  Vitals:   03/31/22 0936  TempSrc:   PainSc: 0-No pain                 March Rummage Majesti Gambrell

## 2022-03-31 NOTE — Anesthesia Preprocedure Evaluation (Addendum)
Anesthesia Evaluation  Patient identified by MRN, date of birth, ID band Patient awake    Reviewed: Allergy & Precautions, NPO status , Patient's Chart, lab work & pertinent test results  Airway Mallampati: II  TM Distance: >3 FB Neck ROM: Full    Dental no notable dental hx.    Pulmonary sleep apnea    Pulmonary exam normal        Cardiovascular hypertension, Pt. on medications and Pt. on home beta blockers  Rhythm:Irregular Rate:Normal  Echocardiogram 07/22/2021:  Normal LV systolic function with visual EF 60-65%. Left ventricle cavity  is normal in size. Mild concentric hypertrophy of the left ventricle.  Normal global wall motion. Unable to evaluate diastolic function due to  underlying rhythm; but filling pattern suggestive of normal LAP.  No significant valvular heart disease.  Compared to study 04/09/2019 no significant change.     Neuro/Psych negative neurological ROS  negative psych ROS   GI/Hepatic negative GI ROS, Neg liver ROS,,,  Endo/Other  diabetes, Type 2, Oral Hypoglycemic Agents    Renal/GU   negative genitourinary   Musculoskeletal negative musculoskeletal ROS (+)    Abdominal Normal abdominal exam  (+)   Peds  Hematology negative hematology ROS (+)   Anesthesia Other Findings   Reproductive/Obstetrics                             Anesthesia Physical Anesthesia Plan  ASA: 3  Anesthesia Plan: General   Post-op Pain Management:    Induction: Intravenous  PONV Risk Score and Plan: 2 and Dexamethasone, Midazolam and Treatment may vary due to age or medical condition  Airway Management Planned: Mask and Oral ETT  Additional Equipment: None  Intra-op Plan:   Post-operative Plan: Extubation in OR  Informed Consent: I have reviewed the patients History and Physical, chart, labs and discussed the procedure including the risks, benefits and alternatives for the  proposed anesthesia with the patient or authorized representative who has indicated his/her understanding and acceptance.     Dental advisory given  Plan Discussed with: CRNA  Anesthesia Plan Comments: (Lab Results      Component                Value               Date                      WBC                      6.7                 03/22/2022                HGB                      14.8                03/22/2022                HCT                      43.9                03/22/2022                MCV  90                  03/22/2022                PLT                      176                 03/22/2022            Lab Results      Component                Value               Date                      NA                       139                 03/22/2022                K                        3.9                 03/22/2022                CO2                      27                  03/22/2022                GLUCOSE                  156 (H)             03/22/2022                BUN                      14                  03/22/2022                CREATININE               0.76                03/22/2022                CALCIUM                  10.0                03/22/2022                EGFR                     103                 03/22/2022                GFRNONAA                 >60  07/15/2021           )       Anesthesia Quick Evaluation

## 2022-03-31 NOTE — Anesthesia Procedure Notes (Signed)
Procedure Name: Intubation Date/Time: 03/31/2022 7:48 AM  Performed by: Dorann Lodge, CRNAPre-anesthesia Checklist: Patient identified, Emergency Drugs available, Suction available and Patient being monitored Patient Re-evaluated:Patient Re-evaluated prior to induction Oxygen Delivery Method: Circle System Utilized Preoxygenation: Pre-oxygenation with 100% oxygen Induction Type: IV induction Ventilation: Mask ventilation without difficulty Laryngoscope Size: Mac and 4 Grade View: Grade I Tube type: Oral Tube size: 7.5 mm Number of attempts: 1 Airway Equipment and Method: Stylet Placement Confirmation: ETT inserted through vocal cords under direct vision, positive ETCO2 and breath sounds checked- equal and bilateral Secured at: 22 cm Tube secured with: Tape Dental Injury: Teeth and Oropharynx as per pre-operative assessment

## 2022-04-05 ENCOUNTER — Ambulatory Visit: Payer: 59 | Admitting: Internal Medicine

## 2022-04-07 ENCOUNTER — Other Ambulatory Visit: Payer: Self-pay | Admitting: Internal Medicine

## 2022-04-28 ENCOUNTER — Ambulatory Visit (HOSPITAL_COMMUNITY)
Admission: RE | Admit: 2022-04-28 | Discharge: 2022-04-28 | Disposition: A | Discharge status: Home / Self Care | Payer: 59 | Source: Ambulatory Visit | Attending: Nurse Practitioner | Admitting: Nurse Practitioner

## 2022-04-28 VITALS — BP 136/80 | HR 61 | Ht 68.0 in | Wt 152.0 lb

## 2022-04-28 DIAGNOSIS — Z7901 Long term (current) use of anticoagulants: Secondary | ICD-10-CM | POA: Insufficient documentation

## 2022-04-28 DIAGNOSIS — I4891 Unspecified atrial fibrillation: Secondary | ICD-10-CM | POA: Insufficient documentation

## 2022-04-28 DIAGNOSIS — D6869 Other thrombophilia: Secondary | ICD-10-CM | POA: Diagnosis not present

## 2022-04-28 DIAGNOSIS — I480 Paroxysmal atrial fibrillation: Secondary | ICD-10-CM

## 2022-04-28 DIAGNOSIS — I10 Essential (primary) hypertension: Secondary | ICD-10-CM | POA: Diagnosis not present

## 2022-04-28 NOTE — Progress Notes (Signed)
Primary Care Physician: Prince Solian, MD Referring Physician: Dr. Starr Lake William Nixon is a 62 y.o. male with a h/o afib that is in the afib clinic for f/u ablation performed 03/31/22 by Dr. Curt Bears. Pt has done well without any s/s of afib since procedure. No swallowing or groin issues. Complaint with anticoagulation.   Today, he denies symptoms of palpitations, chest pain, shortness of breath, orthopnea, PND, lower extremity edema, dizziness, presyncope, syncope, or neurologic sequela. The patient is tolerating medications without difficulties and is otherwise without complaint today.   Past Medical History:  Diagnosis Date   Diabetes mellitus    type II   Hyperlipidemia    pt. states that hdl ldl was elevated denies hyperlipidemia   Hypertension    Hyperthyroidism    pt. denies having any thyroid issues   Personal history of colonic polyps - adenomas 08/06/2013   07/2013 - 3 diminutive adenomas - repeat colonoscopy 2018   Right hydrocele    S/P ACL repair    right    Past Surgical History:  Procedure Laterality Date   ATRIAL FIBRILLATION ABLATION N/A 03/31/2022   Procedure: ATRIAL FIBRILLATION ABLATION;  Surgeon: Constance Haw, MD;  Location: [facility];  Service: Cardiovascular;  Laterality: N/A;   COLONOSCOPY     KNEE SURGERY  2002   right   POLYPECTOMY      Current Outpatient Medications  Medication Sig Dispense Refill   apixaban (ELIQUIS) 5 MG TABS tablet Take 1 tablet (5 mg total) by mouth 2 (two) times daily. 60 tablet 2   Ascorbic Acid (VITAMIN C PO) Take 1 tablet by mouth daily.     Boswellia-Glucosamine-Vit D (OSTEO BI-FLEX ONE PER DAY PO) Take 1 tablet by mouth daily.     dronedarone (MULTAQ) 400 MG tablet TAKE 1 TABLET BY MOUTH TWICE A DAY WITH A MEAL 180 tablet 1   empagliflozin (JARDIANCE) 25 MG TABS tablet TAKE ONE TABLET BY MOUTH DAILY BEFORE BREAKFAST 30 tablet 1   glipiZIDE (GLUCOTROL XL) 5 MG 24 hr tablet Take 1 tablet (5 mg  total) by mouth daily with breakfast. 90 tablet 3   glipiZIDE (GLUCOTROL) 5 MG tablet Take 5 mg by mouth every evening.     metFORMIN (GLUCOPHAGE) 1000 MG tablet TAKE ONE TABLET BY MOUTH TWICE A DAY 180 tablet 3   metoprolol tartrate (LOPRESSOR) 50 MG tablet Take 1 tablet (50 mg total) by mouth 2 (two) times daily. 60 tablet 2   Multiple Vitamin (MULTIVITAMIN WITH MINERALS) TABS Take 1 tablet by mouth daily. Centrum     OneTouch Delica Lancets 00Q MISC Use to check blood sugar 3 times a day. 300 each 12   ONETOUCH VERIO test strip USE ONE STRIP TO TEST THREE TIMES A DAY 50 strip 3   rosuvastatin (CRESTOR) 10 MG tablet Take 10 mg by mouth at bedtime.     TRADJENTA 5 MG TABS tablet Take 5 mg by mouth daily.     No current facility-administered medications for this encounter.    Allergies  Allergen Reactions   Beef-Derived Products     Cultural     Social History   Socioeconomic History   Marital status: Married    Spouse name: Not on file   Number of children: 1   Years of education: Not on file   Highest education level: Not on file  Occupational History   Occupation: Full time  Tobacco Use   Smoking status: Never  Smokeless tobacco: Never  Vaping Use   Vaping Use: Never used  Substance and Sexual Activity   Alcohol use: Yes    Alcohol/week: 2.0 standard drinks of alcohol    Types: 2 Standard drinks or equivalent per week    Comment: occasionally   Drug use: No   Sexual activity: Not on file  Other Topics Concern   Not on file  Social History Narrative   Not on file   Social Determinants of Health   Financial Resource Strain: Not on file  Food Insecurity: Not on file  Transportation Needs: Not on file  Physical Activity: Not on file  Stress: Not on file  Social Connections: Not on file  Intimate Partner Violence: Not on file    Family History  Problem Relation Age of Onset   Diabetes Mother    Diabetes Father    Diabetes Sister    Diabetes Brother     Alcohol abuse Other    Diabetes Other    Hypertension Other     ROS- All systems are reviewed and negative except as per the HPI above  Physical Exam: Vitals:   04/28/22 1133  BP: 136/80  Pulse: 61  Weight: 68.9 kg  Height: '5\' 8"'$  (1.727 m)   Wt Readings from Last 3 Encounters:  04/28/22 68.9 kg  03/31/22 68 kg  12/10/21 69 kg    Labs: Lab Results  Component Value Date   NA 139 03/22/2022   K 3.9 03/22/2022   CL 101 03/22/2022   CO2 27 03/22/2022   GLUCOSE 156 (H) 03/22/2022   BUN 14 03/22/2022   CREATININE 0.76 03/22/2022   CALCIUM 10.0 03/22/2022   MG 1.9 07/15/2021   No results found for: "INR" Lab Results  Component Value Date   CHOL 163 01/16/2018   HDL 27.00 (L) 01/16/2018   LDLCALC 103 (H) 01/16/2018   TRIG 165.0 (H) 01/16/2018     GEN- The patient is well appearing, alert and oriented x 3 today.   Head- normocephalic, atraumatic Eyes-  Sclera clear, conjunctiva pink Ears- hearing intact Oropharynx- clear Neck- supple, no JVP Lymph- no cervical lymphadenopathy Lungs- Clear to ausculation bilaterally, normal work of breathing Heart- Regular rate and rhythm, no murmurs, rubs or gallops, PMI not laterally displaced GI- soft, NT, ND, + BS Extremities- no clubbing, cyanosis, or edema MS- no significant deformity or atrophy Skin- no rash or lesion Psych- euthymic mood, full affect Neuro- strength and sensation are intact  EKG-Vent. rate 61 BPM PR interval 178 ms QRS duration 100 ms QT/QTcB 424/426 ms P-R-T axes 69 89 65 Normal sinus rhythm Normal ECG When compared with ECG of 31-Mar-2022 09:53, PREVIOUS ECG IS PRESENT    Assessment and Plan:  1. Afib  S/p ablation and is doing well staying in SR Continue Multaq 400 mg bid  Continue metoprolol 50 mg bid  2. CHA2DS2VASc  score of 2 Continue eliquis 5 mg bid without interruption for the 3 month post procedure time frame   3,HTN Controlled   F/u with Dr. Curt Bears as scheduled  07/01/22  Geroge Baseman. Cowen Pesqueira, [facility] [address] [place], [place] [address] [phone]

## 2022-05-04 ENCOUNTER — Encounter: Payer: Self-pay | Admitting: Internal Medicine

## 2022-05-04 ENCOUNTER — Ambulatory Visit (INDEPENDENT_AMBULATORY_CARE_PROVIDER_SITE_OTHER): Payer: 59 | Admitting: Internal Medicine

## 2022-05-04 VITALS — BP 139/86 | HR 55 | Ht 68.0 in | Wt 153.0 lb

## 2022-05-04 DIAGNOSIS — Z86010 Personal history of colonic polyps: Secondary | ICD-10-CM

## 2022-05-04 DIAGNOSIS — Z7901 Long term (current) use of anticoagulants: Secondary | ICD-10-CM

## 2022-05-04 DIAGNOSIS — I480 Paroxysmal atrial fibrillation: Secondary | ICD-10-CM

## 2022-05-04 NOTE — Progress Notes (Signed)
William Nixon 62 y.o. [birth year] [id number]  Assessment & Plan:   Encounter Diagnoses  Name Primary?   Personal history of colonic polyps - adenomas Yes   Paroxysmal atrial fibrillation ([place])    Long term current use of anticoagulant    I explained that the guidelines have involved and it is a reasonable time to pursue repeat or that we could delay colonoscopy.  We have decided to revisit it in the summer, plan to recall then and schedule a colonoscopy when he is able to temporarily hold Eliquis.  I think we could do that through the previsit process after I review his chart as opposed to another office visit.  I explained that annual iFOBT testing is not necessary in his case since he is getting surveillance colonoscopies and it is appropriate for him to decline that.  I appreciate the opportunity to care for this patient. CC: Avva, Ravisankar, MD   Subjective:   Chief Complaint: History of colon polyps  HPI 62 year old William Nixon man with a history of colon polyps in 2015 and 2018, here to discuss surveillance examination in the setting of recent A-fib ablation and ongoing Eliquis use.  April 2015 3 adenomas, August 2018 1 adenoma all diminutive.  No active GI symptoms.  Doing well after A-fib ablation by Dr. Curt Bears 03/31/2022.  Needs uninterrupted anticoagulation for 3 months barring emergency.  Reports that he has done annual Hemoccult through Dr. Dell Ponto.  I have looked at last year's from October and he had a normal CBC and CMET at that time.   Allergies  Allergen Reactions   Beef-Derived Products     Cultural    Current Meds  Medication Sig   apixaban (ELIQUIS) 5 MG TABS tablet Take 1 tablet (5 mg total) by mouth 2 (two) times daily.   Ascorbic Acid (VITAMIN C PO) Take 1 tablet by mouth daily.   Boswellia-Glucosamine-Vit D (OSTEO BI-FLEX ONE PER DAY PO) Take 1 tablet by mouth daily.   dronedarone (MULTAQ) 400 MG tablet TAKE 1 TABLET BY MOUTH TWICE A DAY  WITH A MEAL   empagliflozin (JARDIANCE) 25 MG TABS tablet TAKE ONE TABLET BY MOUTH DAILY BEFORE BREAKFAST   glipiZIDE (GLUCOTROL XL) 5 MG 24 hr tablet Take 1 tablet (5 mg total) by mouth daily with breakfast.   glipiZIDE (GLUCOTROL) 5 MG tablet Take 5 mg by mouth every evening.   metFORMIN (GLUCOPHAGE) 1000 MG tablet TAKE ONE TABLET BY MOUTH TWICE A DAY   metoprolol tartrate (LOPRESSOR) 50 MG tablet Take 1 tablet (50 mg total) by mouth 2 (two) times daily.   Multiple Vitamin (MULTIVITAMIN WITH MINERALS) TABS Take 1 tablet by mouth daily. Centrum   OneTouch Delica Lancets 00X MISC Use to check blood sugar 3 times a day.   ONETOUCH VERIO test strip USE ONE STRIP TO TEST THREE TIMES A DAY   rosuvastatin (CRESTOR) 10 MG tablet Take 10 mg by mouth at bedtime.   TRADJENTA 5 MG TABS tablet Take 5 mg by mouth daily.   Past Medical History:  Diagnosis Date   Diabetes mellitus    type II   Hyperlipidemia    pt. states that hdl ldl was elevated denies hyperlipidemia   Hypertension    Hyperthyroidism    pt. denies having any thyroid issues   Paroxysmal atrial fibrillation ([facility])    Personal history of colonic polyps - adenomas 08/06/2013   07/2013 - 3 diminutive adenomas - repeat colonoscopy 2018   Right hydrocele  S/P ACL repair    right    Past Surgical History:  Procedure Laterality Date   ATRIAL FIBRILLATION ABLATION N/A 03/31/2022   Procedure: ATRIAL FIBRILLATION ABLATION;  Surgeon: Constance Haw, MD;  Location: [facility];  Service: Cardiovascular;  Laterality: N/A;   COLONOSCOPY     KNEE SURGERY  2002   right   Social History   Social History Narrative   Married with 1 child   Never tobacco no drug use occasional alcohol approximately 2 drinks per week   family history includes Alcohol abuse in an other family member; Diabetes in his brother, father, mother, sister, and another family member; Hypertension in an other family member.   Review of Systems As per  HPI otherwise negative  Objective:   Physical Exam BP 139/86   Pulse (!) 55   Ht '5\' 8"'$  (1.727 m)   Wt 153 lb (69.4 kg)   BMI 23.26 kg/m  Trim well-developed well-nourished Asian man no acute distress

## 2022-05-04 NOTE — Patient Instructions (Signed)
We are changing your colon recall to June 2024.   Don't do stool testing as we discussed.   Due to recent changes in healthcare laws, you may see the results of your imaging and laboratory studies on MyChart before your provider has had a chance to review them.  We understand that in some cases there may be results that are confusing or concerning to you. Not all laboratory results come back in the same time frame and the provider may be waiting for multiple results in order to interpret others.  Please give [place] 48 hours in order for your provider to thoroughly review all the results before contacting the office for clarification of your results.    I appreciate the opportunity to care for you. Silvano Rusk, MD, [facility]

## 2022-05-19 ENCOUNTER — Other Ambulatory Visit: Payer: Self-pay | Admitting: Internal Medicine

## 2022-05-22 ENCOUNTER — Encounter: Payer: Self-pay | Admitting: Neurology

## 2022-05-23 ENCOUNTER — Other Ambulatory Visit: Payer: Self-pay | Admitting: Neurology

## 2022-05-23 DIAGNOSIS — G4733 Obstructive sleep apnea (adult) (pediatric): Secondary | ICD-10-CM

## 2022-05-26 ENCOUNTER — Encounter (HOSPITAL_COMMUNITY): Payer: Self-pay | Admitting: *Deleted

## 2022-06-01 ENCOUNTER — Other Ambulatory Visit: Payer: Self-pay

## 2022-06-01 MED ORDER — APIXABAN 5 MG PO TABS: 5.0000 mg | ORAL_TABLET | Freq: Two times a day (BID) | ORAL | 6 refills | Status: DC

## 2022-06-09 ENCOUNTER — Other Ambulatory Visit: Payer: Self-pay

## 2022-06-09 ENCOUNTER — Ambulatory Visit: Payer: 59 | Admitting: Cardiology

## 2022-06-09 MED ORDER — APIXABAN 5 MG PO TABS: 5.0000 mg | ORAL_TABLET | Freq: Two times a day (BID) | ORAL | 6 refills | Status: DC

## 2022-06-10 ENCOUNTER — Ambulatory Visit: Payer: 59 | Admitting: Cardiology

## 2022-06-14 ENCOUNTER — Encounter (INDEPENDENT_AMBULATORY_CARE_PROVIDER_SITE_OTHER): Payer: 59 | Admitting: Ophthalmology

## 2022-06-15 ENCOUNTER — Ambulatory Visit: Payer: 59 | Admitting: Internal Medicine

## 2022-06-15 ENCOUNTER — Encounter: Payer: Self-pay | Admitting: Internal Medicine

## 2022-06-15 VITALS — BP 130/70 | HR 55 | Ht 68.0 in | Wt 150.0 lb

## 2022-06-15 DIAGNOSIS — E785 Hyperlipidemia, unspecified: Secondary | ICD-10-CM | POA: Diagnosis not present

## 2022-06-15 DIAGNOSIS — E1169 Type 2 diabetes mellitus with other specified complication: Secondary | ICD-10-CM

## 2022-06-15 DIAGNOSIS — E041 Nontoxic single thyroid nodule: Secondary | ICD-10-CM | POA: Diagnosis not present

## 2022-06-15 DIAGNOSIS — E119 Type 2 diabetes mellitus without complications: Secondary | ICD-10-CM

## 2022-06-15 LAB — POCT GLYCOSYLATED HEMOGLOBIN (HGB A1C): Hemoglobin A1C: 6.7 % — AB (ref 4.0–5.6)

## 2022-06-15 NOTE — Progress Notes (Signed)
Patient ID: William Nixon, male   DOB: 1961-03-02, 62 y.o.   MRN: [id number]  HPI: William Nixon is a 62 y.o.-year-old male, returning for f/u DM2, dx in ~1998, non-insulin-dependent, well controlled, without long-term complications. Last visit 9 months ago.  Interim history: Since last visit, he had cardiac ablation for atrial fibrillation 03/31/2022. Now doing well, w/o complaints.  Previously running, weightlifting.  After his ablation, he is now running/walking and is not weightlifting yet. No increased urination, blurry vision, nausea, chest pain.  DM2: Reviewed latest HbA1c levels: 01/18/2022: HbA1c 6.9% Lab Results  Component Value Date   HGBA1C 6.7 (A) 09/07/2021   HGBA1C 7.0 (A) 04/05/2021   HGBA1C 6.6 (A) 08/25/2020  01/21/2021: HbA1c 7.1% 02/14/2020: HbA1c 6.6% During summer months, as he is more active, his HbA1c is usually lower.  He is on: Medicine  Breakfast Lunch Dinner  Metformin M  M  Glipizide   G -5  Glipizide XL G XL -5    Jardiance 25 J    Januvia/Tradjenta J/T     He also tried herbal remedies: - mustard seed daily >> helps a lot - bitter melon once a week.  He tried Rybelsus >> N/V/weight loss. Tried Trulicity >> N/V and Glu in the 200s. He feels that his sugar control deteriorated after stopping glipizide XL before visit from 12/21 so we started this back.  Pt checks his sugars 1-2 times a day: - am: 98-140, 153 >> 103-139, 150 (b'day) >> 108, 117-164 >> 90, 132-140 (eats dinner later at night) >> 109-138, 162 - 2h after b'fast: n/c  - before lunch: n/c >> 103-165 >> n/c  - 2h after lunch: 135-179, 181 >> 160, 165 >> <170 >> 133-183 >> 170-180 >> 137, 177, 205 - before dinner: 92-114 >> n/c - 2h after dinner: 170-180 >> 162-172 >> n/c >> <170 >> n/c  - bedtime: n/c - nighttime: n/c Lowest sugar was 80s >> 94 >> 98 >> 108 >> 90 >> 90s; he has hypoglycemia awareness in the 80s. Highest sugar was 165 >> 183 >> 180 >> 205.  Glucometer:  AccuChek (?[employer])  His meals are mostly vegetarian.  He continues to walk/run.  -No CKD, last BUN/creatinine: 01/18/2022: 14/0.8, GFR 98.6, glucose 144 01/21/2021: 17/1.0, GFT 76.5, glucose 169 02/11/2020: 12/0.8, GFR 99.3, glucose 129 08/12/2019: 14/0.9, glucose 187 Lab Results  Component Value Date   BUN 14 03/22/2022   CREATININE 0.76 03/22/2022  Prev. On Olmesartan 40 then  losartan 25 << 100 << 50. Now off.  -No persistent microalbuminuria 01/18/2022: ACR 40 Lab Results  Component Value Date   MICRALBCREAT 2.1 01/16/2018   MICRALBCREAT 2.5 01/10/2017   MICRALBCREAT 2.1 01/06/2016   MICRALBCREAT 2.3 09/10/2014   MICRALBCREAT 0.0 04/30/2014   MICRALBCREAT 0.6 10/02/2013   MICRALBCREAT 1.3 05/29/2013   MICRALBCREAT 0.8 11/07/2012   MICRALBCREAT 0.7 11/02/2011   MICRALBCREAT 1.2 04/27/2011   -+ HL; last set of lipids: 01/18/2022: 98/184/16/45 01/21/2021: 124/174/24/65 02/11/2020: 130/104/28/81 03/22/2019: 109/114/29/57 Lab Results  Component Value Date   CHOL 163 01/16/2018   HDL 27.00 (L) 01/16/2018   LDLCALC 103 (H) 01/16/2018   LDLDIRECT 96.9 11/07/2012   TRIG 165.0 (H) 01/16/2018   CHOLHDL 6 01/16/2018  On Crestor 10.  - last eye exam was in 05/2021: no DR (Dr. Deloria Lair).   -No numbness and tingling in his feet.  Last foot exam 08/2021.  Thyroid nodule: -Discovered incidentally, on CT.  Thyroid U/S (08/13/2021): Parenchymal Echotexture: Mildly heterogenous  Isthmus: 4  mm  Right lobe:  4.4 x 1.4 x 1.5 cm  Left lobe: 4.7 x 1.4 x 1.8 cm  _________________________________________________________   Estimated total number of nodules >/= 1 cm: 1  _________________________________________________________   Nodule # 1:  Location: Left; Inferior  Maximum size: 1.3 cm; Other 2 dimensions: 1.1 x 1.0 cm  Composition: mixed cystic and solid (1)  Echogenicity: hypoechoic (2) Given size (<1.4 cm) and appearance, this nodule does NOT meet TI-RADS criteria for  biopsy or dedicated follow-up.  _________________________________________________________   No additional thyroid abnormality. No hypervascularity. Negative for adenopathy.   IMPRESSION: 1.3 cm left inferior thyroid TR 3 nodule does not meet criteria for biopsy or follow-up. This correlates with the chest CT finding.   No other significant abnormality by ultrasound.  Pt denies: - feeling nodules in neck - hoarseness - dysphagia - choking  Also: 01/18/2022: TSH 3.31, free T4 1.6 01/21/2021: TSH 3.5 02/11/2020: TSH 3.44 TSH 3.64 and free T4 1.2, both normal, on 08/12/2019. Lab Results  Component Value Date   TSH 5.19 (H) 01/16/2018   No FH of thyroid cancer. No h/o radiation tx to head or neck. No herbal supplements. No Biotin use. No steroids use.   He had a syncopal episode in 02/2019 with loss of consciousness for 1 to 2 minutes - he saw cardiology (Dr. Rosalia Hammers) and this was considered a vasovagal syncope.  He was then found to have paroxysmal A. Fib 2/2 moderate OSA (now on a CPAP). He was started on a blood thinner (now off), also on statin and diltiazem. Still on Multaq.  He had another syncopal episode 07/15/2021.  She was found to be hypotensive (dehydration + overtreatment with antihypertensive medications) at that time.  After the last episode, we decreased the dose of Jardiance to half a 25 mg tablet >> then back to 25 mg. Also on Metoprolol, Eliquis.  He started a new job in 06/2016.  Now working from home.  Much less stress.  ROS: + See HPI  I reviewed pt's medications, allergies, PMH, social hx, family hx, and changes were documented in the history of present illness. Otherwise, unchanged from my initial visit note.  Past Medical History:  Diagnosis Date   Diabetes mellitus    type II   Hyperlipidemia    pt. states that hdl ldl was elevated denies hyperlipidemia   Hypertension    Hyperthyroidism    pt. denies having any thyroid issues   Paroxysmal atrial  fibrillation (HCC)    Personal history of colonic polyps - adenomas 08/06/2013   07/2013 - 3 diminutive adenomas - repeat colonoscopy 2018   Right hydrocele    S/P ACL repair    right    Past Surgical History:  Procedure Laterality Date   ATRIAL FIBRILLATION ABLATION N/A 03/31/2022   Procedure: ATRIAL FIBRILLATION ABLATION;  Surgeon: Constance Haw, MD;  Location: [facility];  Service: Cardiovascular;  Laterality: N/A;   COLONOSCOPY     KNEE SURGERY  2002   right   History   Social History   Marital Status: Married    Spouse Name: N/A    Number of Children: 1   Occupational History   controller   Social History Main Topics   Smoking status: Never Smoker    Smokeless tobacco: Never Used   Alcohol Use:     1 drink(s) per week   Drug Use: No   Current Outpatient Medications on File Prior to Visit  Medication Sig Dispense Refill  apixaban (ELIQUIS) 5 MG TABS tablet Take 1 tablet (5 mg total) by mouth 2 (two) times daily. 60 tablet 6   Ascorbic Acid (VITAMIN C PO) Take 1 tablet by mouth daily.     Boswellia-Glucosamine-Vit D (OSTEO BI-FLEX ONE PER DAY PO) Take 1 tablet by mouth daily.     dronedarone (MULTAQ) 400 MG tablet TAKE 1 TABLET BY MOUTH TWICE A DAY WITH A MEAL 180 tablet 1   glipiZIDE (GLUCOTROL XL) 5 MG 24 hr tablet Take 1 tablet (5 mg total) by mouth daily with breakfast. 90 tablet 3   glipiZIDE (GLUCOTROL) 5 MG tablet Take 5 mg by mouth every evening.     JARDIANCE 25 MG TABS tablet TAKE 1 TABLET BY MOUTH DAILY BEFORE BREAKFAST 30 tablet 1   metFORMIN (GLUCOPHAGE) 1000 MG tablet TAKE ONE TABLET BY MOUTH TWICE A DAY 180 tablet 3   metoprolol tartrate (LOPRESSOR) 50 MG tablet Take 1 tablet (50 mg total) by mouth 2 (two) times daily. 60 tablet 2   Multiple Vitamin (MULTIVITAMIN WITH MINERALS) TABS Take 1 tablet by mouth daily. Centrum     OneTouch Delica Lancets 99991111 MISC Use to check blood sugar 3 times a day. 300 each 12   ONETOUCH VERIO test strip USE  ONE STRIP TO TEST THREE TIMES A DAY 50 strip 3   rosuvastatin (CRESTOR) 10 MG tablet Take 10 mg by mouth at bedtime.     TRADJENTA 5 MG TABS tablet Take 5 mg by mouth daily.     No current facility-administered medications on file prior to visit.   Allergies  Allergen Reactions   Beef-Derived Products     Cultural    Family History  Problem Relation Age of Onset   Diabetes Mother    Diabetes Father    Diabetes Sister    Diabetes Brother    Alcohol abuse Other    Diabetes Other    Hypertension Other    PE: BP 130/70 (BP Location: Left Arm, Patient Position: Sitting, Cuff Size: Small)   Pulse (!) 55   Ht '5\' 8"'$  (1.727 m)   Wt 150 lb (68 kg)   SpO2 96%   BMI 22.81 kg/m   Wt Readings from Last 3 Encounters:  06/15/22 150 lb (68 kg)  05/04/22 153 lb (69.4 kg)  04/28/22 152 lb (68.9 kg)   Constitutional: normal weight, in NAD Eyes:  EOMI, no exophthalmos ENT: no neck masses, no cervical lymphadenopathy Cardiovascular: RRR, No MRG Respiratory: CTA B Musculoskeletal: no deformities Skin:no rashes Neurological: no tremor with outstretched hands  ASSESSMENT: 1. DM2, non-insulin-dependent, controlled, without complications - also FH of mild DM on mother's side  In the past, he was getting [place] and [place] from [place] - much cheaper.  2. HL  3.  Left thyroid nodule  PLAN:  1. Patient with longstanding, uncontrolled, type 2 diabetes, on oral antidiabetic regimen with metformin, sulfonylurea, SGLT2 inhibitor and DPP 4 inhibitor, with good control.In the past, PCP tried to stop his sulfonylurea (2021) and started on oral, then an injectable GLP-1 receptor agonist, however, he did not tolerate these: Nausea, vomiting, significantly decreased appetite and weight loss.  Afterwards, sulfonylurea was restarted.   -At last visit, HbA1c was at goal, at 6.7%, lower.  Sugars appear to be at goal but in the upper interval of the target range.  In the morning, sugars were occasionally  higher due to having dinners as late as 9 PM.  My recommendation was to try to move these  earlier.  We tried in the past to move the entire dose of metformin with dinner but he had hyperglycemia during the day. -At last visit he had less stress since he started to work from home.  He was able to exercise consistently.  We did not change his regimen at that time.  4 months ago, the HbA1c was slightly higher, at 6.9%. -At today's visit, sugars are mostly at goal with occasional hyperglycemic spikes after larger dinners - no need to change his regimen for now. - I suggested to:  Patient Instructions  Please use the following regimen: Medicine  Breakfast Lunch Dinner  Metformin M  M  Glipizide   G -5  Glipizide XL G XL -5    Jardiance J    Tradjeta T      Please talk to Dr. Einar Gip about restarting Losartan.  Please return in 6 months with your sugar log.    - we checked his HbA1c: 6.7% (slightly lower) - advised to check sugars at different times of the day - 1-2x a day, rotating check times - advised for yearly eye exams >> he is UTD - we did discuss about his most recent ACR, which was elevated.  He is off his ACE inhibitor after his metoprolol dose had to be increased for heart rate control.  However, now that he had the ablation, I advised him to discuss with Dr. Einar Gip to see if he can reduce the dose of metoprolol and restart Cozaar. - return to clinic in 6 months  2. HL -Reviewed latest lipid panel from  01/18/2022: 98/184/16/45 -LDL at goal, HDL very low, triglycerides slightly high -He is on Crestor 10 mg daily without side effects  3.  Left thyroid nodule -Incidentally seen on CT scan, and then on the dedicated thyroid ultrasound -The nodule is found in the left inferior thyroid gland, appears to be mixed (solid-cystic), hypoechoic.  The nodule measures 1.3 x 1.1 x 1 cm. -Neck compression symptoms -Since the nodule has no worrisome features and it is small, no further follow-up is  needed for this  Philemon Kingdom, MD PhD [facility] Endocrin[facility]

## 2022-06-15 NOTE — Patient Instructions (Addendum)
Please use the following regimen: Medicine  Breakfast Lunch Dinner  Metformin M  M  Glipizide   G -5  Glipizide XL G XL -5    Jardiance J    Tradjeta T      Please talk to Dr. Einar Gip about restarting Losartan.  Please return in 6 months with your sugar log.

## 2022-06-16 ENCOUNTER — Encounter: Payer: Self-pay | Admitting: Cardiology

## 2022-06-16 ENCOUNTER — Ambulatory Visit: Payer: 59 | Admitting: Cardiology

## 2022-06-16 VITALS — BP 140/80 | HR 59 | Resp 17 | Ht 68.0 in | Wt 151.6 lb

## 2022-06-16 DIAGNOSIS — I10 Essential (primary) hypertension: Secondary | ICD-10-CM

## 2022-06-16 DIAGNOSIS — E1169 Type 2 diabetes mellitus with other specified complication: Secondary | ICD-10-CM

## 2022-06-16 DIAGNOSIS — R931 Abnormal findings on diagnostic imaging of heart and coronary circulation: Secondary | ICD-10-CM

## 2022-06-16 DIAGNOSIS — I480 Paroxysmal atrial fibrillation: Secondary | ICD-10-CM

## 2022-06-16 MED ORDER — OLMESARTAN MEDOXOMIL 20 MG PO TABS: 20.0000 mg | ORAL_TABLET | Freq: Every evening | ORAL | 1 refills | Status: DC

## 2022-06-16 NOTE — Progress Notes (Signed)
Primary Physician/Referring:  Prince Solian, MD  Patient ID: William Nixon, male    DOB: 12/17/1960, 62 y.o.   MRN: [id number]  Chief Complaint  Patient presents with   Atrial Fibrillation   Follow-up    6 months   HPI:    William Nixon  is a 62 y.o. [place] male patient with diabetes mellitus, hyperlipidemia, presents for follow-up of paroxysmal atrial fibrillation, underwent atrial fibrillation ablation on 03/31/2022 by Dr. Curt Bears.    This is a 53-monthoffice visit, remains asymptomatic.  He is accompanied by his wife.   Past Medical History:  Diagnosis Date   Diabetes mellitus    type II   Hyperlipidemia    pt. states that hdl ldl was elevated denies hyperlipidemia   Hypertension    Hyperthyroidism    pt. denies having any thyroid issues   Paroxysmal atrial fibrillation (HCC)    Personal history of colonic polyps - adenomas 08/06/2013   07/2013 - 3 diminutive adenomas - repeat colonoscopy 2018   Right hydrocele    S/P ACL repair    right    Past Surgical History:  Procedure Laterality Date   ATRIAL FIBRILLATION ABLATION N/A 03/31/2022   Procedure: ATRIAL FIBRILLATION ABLATION;  Surgeon: CConstance Haw MD;  [facility];  Service: Cardiovascular;  Laterality: N/A;   COLONOSCOPY     KNEE SURGERY  2002   right   Family History  Problem Relation Age of Onset   Diabetes Mother    Diabetes Father    Diabetes Sister    Diabetes Brother    Alcohol abuse Other    Diabetes Other    Hypertension Other     Social History   Tobacco Use   Smoking status: Never   Smokeless tobacco: Never  Substance Use Topics   Alcohol use: Yes    Alcohol/week: 2.0 standard drinks of alcohol    Types: 2 Standard drinks or equivalent per week    Comment: occasionally   Marital Status: Married  ROS  Review of Systems  Cardiovascular:  Negative for chest pain, dyspnea on exertion and leg swelling.   Objective  Blood pressure (!) 140/80,  pulse (!) 59, resp. rate 17, height '5\' 8"'$  (1.727 m), weight 151 lb 9.6 oz (68.8 kg), SpO2 99 %. Body mass index is 23.05 kg/m.     06/16/2022   11:54 AM 06/15/2022    8:30 AM 05/04/2022    8:30 AM  Vitals with BMI  Height '5\' 8"'$  '5\' 8"'$  '5\' 8"'$   Weight 151 lbs 10 oz 150 lbs 153 lbs  BMI 23.06 [id number] Systolic [id number] Diastolic 80 70 86  Pulse 59 55 55    Physical Exam Neck:     Vascular: No JVD.  Cardiovascular:     Rate and Rhythm: Normal rate and regular rhythm.     Pulses: Intact distal pulses.     Heart sounds: Normal heart sounds. No murmur heard.    No gallop.  Pulmonary:     Effort: Pulmonary effort is normal.     Breath sounds: Normal breath sounds.  Abdominal:     General: Bowel sounds are normal.     Palpations: Abdomen is soft.  Musculoskeletal:     Right lower leg: No edema.     Left lower leg: No edema.    Medications and allergies   Allergies  Allergen Reactions   Beef-Derived Products     Cultural  Dulaglutide Nausea And Vomiting     Medication list after today's encounter   Current Outpatient Medications:    apixaban (ELIQUIS) 5 MG TABS tablet, Take 1 tablet (5 mg total) by mouth 2 (two) times daily., Disp: 60 tablet, Rfl: 6   Ascorbic Acid (VITAMIN C PO), Take 1 tablet by mouth daily., Disp: , Rfl:    Boswellia-Glucosamine-Vit D (OSTEO BI-FLEX ONE PER DAY PO), Take 1 tablet by mouth daily., Disp: , Rfl:    dronedarone (MULTAQ) 400 MG tablet, TAKE 1 TABLET BY MOUTH TWICE A DAY WITH A MEAL, Disp: 180 tablet, Rfl: 1   glipiZIDE (GLUCOTROL XL) 5 MG 24 hr tablet, Take 1 tablet (5 mg total) by mouth daily with breakfast., Disp: 90 tablet, Rfl: 3   JARDIANCE 25 MG TABS tablet, TAKE 1 TABLET BY MOUTH DAILY BEFORE BREAKFAST, Disp: 30 tablet, Rfl: 1   metFORMIN (GLUCOPHAGE) 1000 MG tablet, TAKE ONE TABLET BY MOUTH TWICE A DAY, Disp: 180 tablet, Rfl: 3   metoprolol tartrate (LOPRESSOR) 50 MG tablet, Take 1 tablet (50 mg total) by mouth 2 (two) times  daily., Disp: 60 tablet, Rfl: 2   Multiple Vitamin (MULTIVITAMIN WITH MINERALS) TABS, Take 1 tablet by mouth daily. Centrum, Disp: , Rfl:    olmesartan (BENICAR) 20 MG tablet, Take 1 tablet (20 mg total) by mouth every evening., Disp: 90 tablet, Rfl: 1   OneTouch Delica Lancets 99991111 MISC, Use to check blood sugar 3 times a day., Disp: 300 each, Rfl: 12   ONETOUCH VERIO test strip, USE ONE STRIP TO TEST THREE TIMES A DAY, Disp: 50 strip, Rfl: 3   rosuvastatin (CRESTOR) 10 MG tablet, Take 10 mg by mouth at bedtime., Disp: , Rfl:    TRADJENTA 5 MG TABS tablet, Take 5 mg by mouth daily., Disp: , Rfl:   Laboratory examination:   Recent Labs    07/15/21 0849 03/22/22 1348  NA 137 139  K 3.6 3.9  CL 105 101  CO2 22 27  GLUCOSE 228* 156*  BUN 14 14  CREATININE 0.90 0.76  CALCIUM 8.7* 10.0  GFRNONAA >60  --       Latest Ref Rng & Units 03/22/2022    1:48 PM 07/15/2021    8:49 AM 01/16/2018   10:08 AM  CMP  Glucose 70 - 99 mg/dL 156  228  134   BUN 8 - 27 mg/dL '14  14  15   '$ Creatinine 0.76 - 1.27 mg/dL 0.76  0.90  0.88   Sodium 134 - 144 mmol/L 139  137  139   Potassium 3.5 - 5.2 mmol/L 3.9  3.6  4.4   Chloride 96 - 106 mmol/L 101  105  102   CO2 20 - 29 mmol/L '27  22  29   '$ Calcium 8.6 - 10.2 mg/dL 10.0  8.7  9.3   Total Protein 6.0 - 8.3 g/dL   7.3   Total Bilirubin 0.2 - 1.2 mg/dL   0.7   Alkaline Phos 39 - 117 U/L   68   AST 0 - 37 U/L   25   ALT 0 - 53 U/L   28       Latest Ref Rng & Units 03/22/2022    1:48 PM 07/15/2021    8:49 AM 01/16/2018   10:08 AM  CBC  WBC 3.4 - 10.8 x10E3/uL 6.7  10.3  3.6   Hemoglobin 13.0 - 17.7 g/dL 14.8  14.3  15.5   Hematocrit 37.5 -  51.0 % 43.9  42.9  44.2   Platelets 150 - 450 x10E3/uL 176  279  136.0     HEMOGLOBIN A1C Lab Results  Component Value Date   HGBA1C 6.7 (A) 06/15/2022   External labs:   Cholesterol, total 98.000 mg 02/03/2022 HDL 16.000 mg 02/03/2022 LDL 45.000 mg 02/03/2022 Triglycerides 184.000 m 02/03/2022  A1C  6.900 % 02/03/2022  TSH 3.500 01/21/2021  Radiology:    Cardiac Studies:   PCV MYOCARDIAL PERFUSION WO LEXISCAN 03/20/2019  No previous exam available for comparison. Lexiscan nuclear stress test performed using 1-day protocol. Myocardial perfusion imaging is normal. Left ventricular ejection fraction is  60% with normal wall motion. Low risk study.  PCV ECHOCARDIOGRAM COMPLETE 07/22/2021  Normal LV systolic function with visual EF 60-65%. Left ventricle cavity is normal in size. Mild concentric hypertrophy of the left ventricle. Normal global wall motion. Unable to evaluate diastolic function due to underlying rhythm; but filling pattern suggestive of normal LAP. No significant valvular heart disease. Compared to study 04/09/2019 no significant change.  Mobile cardiac telemetry 13 days 07/22/2021 - 08/05/2021: Dominant rhythm: Sinus. HR 49-145 bpm. Avg HR 78 bpm, in sinus rhythm. Atrial fibrillation occurred (28% burden), ventricular rate 54-188 bpm, avg 100 bpm. 1 episode of atrial tachcyardia at 143 bpm for 12 beats. <1% isolated SVE, couplet/triplets. 0 episodes of VT. <1% isolated VE, couplets. No atrial fibrillationVT/high grade AV block, sinus pause >3sec noted. 0 patient triggered events.   CT cardiac structure 04/06/2022: CAC score of 1469 which is 98th percentile for age-, race-, and sex-matched controls  Atrial fibrillation ablation 03/31/2022: 1. Comprehensive electrophysiologic study. 2. Coronary sinus pacing and recording. 3. Three-dimensional mapping of atrial fibrillation with additional mapping and ablation of a second discrete focus 4. Ablation of atrial fibrillation with additional mapping and ablation of a second discrete focus  EKG:   EKG 06/16/2022: Normal sinus rhythm/sinus bradycardia rate of 57 bpm, incomplete right bundle branch block.  Otherwise normal EKG.  Compared to 09/09/2021, no change.  Assessment     ICD-10-CM   1. Paroxysmal atrial  fibrillation (HCC)  I48.0 EKG 12-Lead    2. Primary hypertension  I10 olmesartan (BENICAR) 20 MG tablet    Basic metabolic panel    3. Elevated coronary artery calcium score 04/06/2022: CAC score 1469, 98th percentile.  R93.1 Lipoprotein A (LPA)    4. Hyperlipidemia associated with type 2 diabetes mellitus (HCC)  E11.69 Lipoprotein A (LPA)   E78.5       CHA2DS2-VASc Score is 2.  Yearly risk of stroke: 2.3% (HTN, DM).  Score of 1=0.6; 2=2.2; 3=3.2; 4=4.8; 5=7.2; 6=9.8; 7=>9.8) -(CHF; HTN; vasc disease DM,  Male = 1; Age <65 =0; 65-74 = 1,  >75 =2; stroke/embolism= 2).    There are no discontinued medications.   Meds ordered this encounter  Medications   olmesartan (BENICAR) 20 MG tablet    Sig: Take 1 tablet (20 mg total) by mouth every evening.    Dispense:  90 tablet    Refill:  1   Orders Placed This Encounter  Procedures   Basic metabolic panel   Lipoprotein A (LPA)   EKG 12-Lead   Recommendations:   ROUX GIESBRECHT is a 62 y.o.  [place] male patient with diabetes mellitus, hyperlipidemia, presents for follow-up of paroxysmal atrial fibrillation, underwent atrial fibrillation ablation on 03/31/2022 by Dr. Curt Bears.   1. Paroxysmal atrial fibrillation ([place]) Patient has low [place] vascular score.  We had extensive discussion regarding continued  anticoagulation versus stopping anticoagulation after 3 months of therapy for atrial fibrillation.  Disposition has to be initiated decision making.  He will also discuss this with Dr. Curt Bears on his next office visit.  2. Primary hypertension Blood pressure was mildly elevated today however he has excellent blood pressure control.  He would like to come off of beta-blocker therapy, presently on metoprolol 50 mg twice daily, will slowly wean him off and in view of diabetic state, I will start him on Benicar 20 mg in the evening and he will need up titration to 40 mg if blood pressure is >130/80 mmHg.  Patient is an avid runner  hence I do not want to use any diuretics.  3. Elevated coronary artery calcium score 04/06/2022: CAC score 1469, 98th percentile. During evaluation of his cardiac structures by CT prior to AF ablation, coronary calcium score noted to be extremely high.  I discussed with him that he has had a negative nuclear stress test, he is completely asymptomatic, he is on appropriate and guideline directed medical therapy with regard to cardiac risk stratification with excellent control of lipids, hypertension and also diabetes mellitus and he continues to exercise.  4. Hyperlipidemia associated with type 2 diabetes mellitus ([place]) In view of markedly elevated coronary calcium score, I will obtain LPA along with BMP once he starts Benicar in 2 weeks to 3 weeks.  I will see him back in 6 months for follow-up. He does have severe obstructive sleep apnea and he is compliant with CPAP.    Adrian Prows, MD, [facility] 06/16/2022, 12:30 PM Office: [phone]

## 2022-06-16 NOTE — Progress Notes (Deleted)
Primary Physician/Referring:  Prince Solian, MD  Patient ID: William Nixon, male    DOB: [birth year], 61 y.o.   MRN: [id number]  Chief Complaint  Patient presents with  . Atrial Fibrillation  . Follow-up    6 months   HPI:    William Nixon  is a 62 y.o. [place] male patient with diabetes mellitus, hyperlipidemia, presents for follow-up of paroxysmal atrial fibrillation, underwent atrial fibrillation ablation on 03/31/2022 by Dr. Curt Bears.    This is a 61-monthoffice visit, patient and his wife state that he has not had any further episodes of atrial fibrillation over the past 2 months.  He is wondering whether he could try medical therapy instead of ablation.  He is accompanied by his wife.   Past Medical History:  Diagnosis Date  . Diabetes mellitus    type II  . Hyperlipidemia    pt. states that hdl ldl was elevated denies hyperlipidemia  . Hypertension   . Hyperthyroidism    pt. denies having any thyroid issues  . Paroxysmal atrial fibrillation (HCC)   . Personal history of colonic polyps - adenomas 08/06/2013   07/2013 - 3 diminutive adenomas - repeat colonoscopy 2018  . Right hydrocele   . S/P ACL repair    right    Past Surgical History:  Procedure Laterality Date  . ATRIAL FIBRILLATION ABLATION N/A 03/31/2022   Procedure: ATRIAL FIBRILLATION ABLATION;  Surgeon: CConstance Haw MD;  [facility];  Service: Cardiovascular;  Laterality: N/A;  . COLONOSCOPY    . KNEE SURGERY  2002   right   Family History  Problem Relation Age of Onset  . Diabetes Mother   . Diabetes Father   . Diabetes Sister   . Diabetes Brother   . Alcohol abuse Other   . Diabetes Other   . Hypertension Other     Social History   Tobacco Use  . Smoking status: Never  . Smokeless tobacco: Never  Substance Use Topics  . Alcohol use: Yes    Alcohol/week: 2.0 standard drinks of alcohol    Types: 2 Standard drinks or equivalent per week    Comment:  occasionally   Marital Status: Married  ROS  Review of Systems  Cardiovascular:  Negative for chest pain, dyspnea on exertion and leg swelling.   Objective  Blood pressure (!) 140/80, pulse (!) 59, resp. rate 17, height '5\' 8"'$  (1.727 m), weight 151 lb 9.6 oz (68.8 kg), SpO2 99 %. Body mass index is 23.05 kg/m.     06/16/2022   11:54 AM 06/15/2022    8:30 AM 05/04/2022    8:30 AM  Vitals with BMI  Height '5\' 8"'$  '5\' 8"'$  '5\' 8"'$   Weight 151 lbs 10 oz 150 lbs 153 lbs  BMI 23.06 [id number] Systolic [id number] Diastolic 80 70 86  Pulse 59 55 55    Physical Exam Neck:     Vascular: No JVD.  Cardiovascular:     Rate and Rhythm: Normal rate and regular rhythm.     Pulses: Intact distal pulses.     Heart sounds: Normal heart sounds. No murmur heard.    No gallop.  Pulmonary:     Effort: Pulmonary effort is normal.     Breath sounds: Normal breath sounds.  Abdominal:     General: Bowel sounds are normal.     Palpations: Abdomen is soft.  Musculoskeletal:     Right lower leg: No  edema.     Left lower leg: No edema.   Medications and allergies   Allergies  Allergen Reactions  . Beef-Derived Products     Cultural   . Dulaglutide Nausea And Vomiting     Medication list after today's encounter   Current Outpatient Medications:  .  apixaban (ELIQUIS) 5 MG TABS tablet, Take 1 tablet (5 mg total) by mouth 2 (two) times daily., Disp: 60 tablet, Rfl: 6 .  Ascorbic Acid (VITAMIN C PO), Take 1 tablet by mouth daily., Disp: , Rfl:  .  Boswellia-Glucosamine-Vit D (OSTEO BI-FLEX ONE PER DAY PO), Take 1 tablet by mouth daily., Disp: , Rfl:  .  dronedarone (MULTAQ) 400 MG tablet, TAKE 1 TABLET BY MOUTH TWICE A DAY WITH A MEAL, Disp: 180 tablet, Rfl: 1 .  glipiZIDE (GLUCOTROL XL) 5 MG 24 hr tablet, Take 1 tablet (5 mg total) by mouth daily with breakfast., Disp: 90 tablet, Rfl: 3 .  JARDIANCE 25 MG TABS tablet, TAKE 1 TABLET BY MOUTH DAILY BEFORE BREAKFAST, Disp: 30 tablet, Rfl: 1 .   metFORMIN (GLUCOPHAGE) 1000 MG tablet, TAKE ONE TABLET BY MOUTH TWICE A DAY, Disp: 180 tablet, Rfl: 3 .  metoprolol tartrate (LOPRESSOR) 50 MG tablet, Take 1 tablet (50 mg total) by mouth 2 (two) times daily., Disp: 60 tablet, Rfl: 2 .  Multiple Vitamin (MULTIVITAMIN WITH MINERALS) TABS, Take 1 tablet by mouth daily. Centrum, Disp: , Rfl:  .  olmesartan (BENICAR) 20 MG tablet, Take 1 tablet (20 mg total) by mouth every evening., Disp: 90 tablet, Rfl: 1 .  OneTouch Delica Lancets 99991111 MISC, Use to check blood sugar 3 times a day., Disp: 300 each, Rfl: 12 .  ONETOUCH VERIO test strip, USE ONE STRIP TO TEST THREE TIMES A DAY, Disp: 50 strip, Rfl: 3 .  rosuvastatin (CRESTOR) 10 MG tablet, Take 10 mg by mouth at bedtime., Disp: , Rfl:  .  TRADJENTA 5 MG TABS tablet, Take 5 mg by mouth daily., Disp: , Rfl:   Laboratory examination:   Recent Labs    07/15/21 0849 03/22/22 1348  NA 137 139  K 3.6 3.9  CL 105 101  CO2 22 27  GLUCOSE 228* 156*  BUN 14 14  CREATININE 0.90 0.76  CALCIUM 8.7* 10.0  GFRNONAA >60  --        Latest Ref Rng & Units 03/22/2022    1:48 PM 07/15/2021    8:49 AM 01/16/2018   10:08 AM  CMP  Glucose 70 - 99 mg/dL 156  228  134   BUN 8 - 27 mg/dL '14  14  15   '$ Creatinine 0.76 - 1.27 mg/dL 0.76  0.90  0.88   Sodium 134 - 144 mmol/L 139  137  139   Potassium 3.5 - 5.2 mmol/L 3.9  3.6  4.4   Chloride 96 - 106 mmol/L 101  105  102   CO2 20 - 29 mmol/L '27  22  29   '$ Calcium 8.6 - 10.2 mg/dL 10.0  8.7  9.3   Total Protein 6.0 - 8.3 g/dL   7.3   Total Bilirubin 0.2 - 1.2 mg/dL   0.7   Alkaline Phos 39 - 117 U/L   68   AST 0 - 37 U/L   25   ALT 0 - 53 U/L   28       Latest Ref Rng & Units 03/22/2022    1:48 PM 07/15/2021    8:49 AM 01/16/2018  10:08 AM  CBC  WBC 3.4 - 10.8 x10E3/uL 6.7  10.3  3.6   Hemoglobin 13.0 - 17.7 g/dL 14.8  14.3  15.5   Hematocrit 37.5 - 51.0 % 43.9  42.9  44.2   Platelets 150 - 450 x10E3/uL 176  279  136.0     HEMOGLOBIN A1C Lab Results   Component Value Date   HGBA1C 6.7 (A) 06/15/2022   External labs:   Cholesterol, total 98.000 mg 02/03/2022 HDL 16.000 mg 02/03/2022 LDL 45.000 mg 02/03/2022 Triglycerides 184.000 m 02/03/2022  A1C 6.900 % 02/03/2022  TSH 3.500 01/21/2021  Radiology:    Cardiac Studies:   PCV MYOCARDIAL PERFUSION WO LEXISCAN 03/20/2019  No previous exam available for comparison. Lexiscan nuclear stress test performed using 1-day protocol. Myocardial perfusion imaging is normal. Left ventricular ejection fraction is  60% with normal wall motion. Low risk study.  PCV ECHOCARDIOGRAM COMPLETE 07/22/2021  Normal LV systolic function with visual EF 60-65%. Left ventricle cavity is normal in size. Mild concentric hypertrophy of the left ventricle. Normal global wall motion. Unable to evaluate diastolic function due to underlying rhythm; but filling pattern suggestive of normal LAP. No significant valvular heart disease. Compared to study 04/09/2019 no significant change.  Mobile cardiac telemetry 13 days 07/22/2021 - 08/05/2021: Dominant rhythm: Sinus. HR 49-145 bpm. Avg HR 78 bpm, in sinus rhythm. Atrial fibrillation occurred (28% burden), ventricular rate 54-188 bpm, avg 100 bpm. 1 episode of atrial tachcyardia at 143 bpm for 12 beats. <1% isolated SVE, couplet/triplets. 0 episodes of VT. <1% isolated VE, couplets. No atrial fibrillationVT/high grade AV block, sinus pause >3sec noted. 0 patient triggered events.   CT cardiac structure 04/06/2022: CAC score of 1469 which is 98th percentile for age-, race-, and sex-matched controls  Atrial fibrillation ablation 03/31/2022: 1. Comprehensive electrophysiologic study. 2. Coronary sinus pacing and recording. 3. Three-dimensional mapping of atrial fibrillation with additional mapping and ablation of a second discrete focus 4. Ablation of atrial fibrillation with additional mapping and ablation of a second discrete focus  EKG:   EKG 06/16/2022:  Normal sinus rhythm/sinus bradycardia rate of 57 bpm, incomplete right bundle branch block.  Otherwise normal EKG.  Compared to 09/09/2021, no change.  Assessment     ICD-10-CM   1. Paroxysmal atrial fibrillation (HCC)  I48.0 EKG 12-Lead    2. Primary hypertension  I10 olmesartan (BENICAR) 20 MG tablet    3. Elevated coronary artery calcium score 04/06/2022: CAC score 1469, 98th percentile.  R93.1       CHA2DS2-VASc Score is 2.  Yearly risk of stroke: 2.3% (HTN, DM).  Score of 1=0.6; 2=2.2; 3=3.2; 4=4.8; 5=7.2; 6=9.8; 7=>9.8) -(CHF; HTN; vasc disease DM,  Male = 1; Age <65 =0; 65-74 = 1,  >75 =2; stroke/embolism= 2).    There are no discontinued medications.   Meds ordered this encounter  Medications  . olmesartan (BENICAR) 20 MG tablet    Sig: Take 1 tablet (20 mg total) by mouth every evening.    Dispense:  90 tablet    Refill:  1   Orders Placed This Encounter  Procedures  . EKG 12-Lead   Recommendations:   William Nixon is a 61 y.o.  [place] male patient with diabetes mellitus, hyperlipidemia, presents for follow-up of paroxysmal atrial fibrillation, underwent atrial fibrillation ablation on 03/31/2022 by Dr. Curt Bears.   ***  This is a 66-monthoffice visit, patient and his wife state that he has not had any further episodes of atrial fibrillation over  the past 2 months.  He is wondering whether he could try medical therapy instead of ablation.  I would like to put him on Multaq 400 mg p.o. twice daily.  He will come for an EKG in 2 weeks.  He could certainly consider not having atrial fibrillation ablation if he tolerates medication without recurrence of atrial fibrillation.  As he is very active, likes to garden, he likes to run regularly, did not want to use flecainide.  Other option would be dofetilide that needs hospitalization.  He is tolerating anticoagulation.  I have discussed with him and his wife that with regard to anticoagulation, in view of recurrence  of atrial fibrillation, probably best option to continue long-term anticoagulation, although his CHA2DS2-VASc risk score is 2.0.  In 2 to 6 months if he has not had any recurrence of atrial fibrillation, we could consider stopping the anticoagulation by shared decision making.  For now advised him to continue the same. He does have severe obstructive sleep apnea and he is compliant with CPAP.    Adrian Prows, MD, [facility] 06/16/2022, 12:23 PM Office: [phone]

## 2022-06-16 NOTE — Progress Notes (Deleted)
Primary Physician/Referring:  Prince Solian, MD  Patient ID: William Nixon, male    DOB: [birth year], 61 y.o.   MRN: [id number]  Chief Complaint  Patient presents with   Atrial Fibrillation   Follow-up    6 months   HPI:    William Nixon  is a 62 y.o. [place] male patient with diabetes mellitus, hyperlipidemia, presents for follow-up of paroxysmal atrial fibrillation, underwent atrial fibrillation ablation on 03/31/2022 by Dr. Curt Bears.    This is a 61-monthoffice visit, patient and his wife state that he has not had any further episodes of atrial fibrillation over the past 61 months.  He is wondering whether he could try medical therapy instead of ablation.  He is accompanied by his wife.   Past Medical History:  Diagnosis Date   Diabetes mellitus    type II   Hyperlipidemia    pt. states that hdl ldl was elevated denies hyperlipidemia   Hypertension    Hyperthyroidism    pt. denies having any thyroid issues   Paroxysmal atrial fibrillation (HCC)    Personal history of colonic polyps - adenomas 08/06/2013   07/2013 - 3 diminutive adenomas - repeat colonoscopy 2018   Right hydrocele    S/P ACL repair    right    Past Surgical History:  Procedure Laterality Date   ATRIAL FIBRILLATION ABLATION N/A 03/31/2022   Procedure: ATRIAL FIBRILLATION ABLATION;  Surgeon: CConstance Haw MD;  [facility];  Service: Cardiovascular;  Laterality: N/A;   COLONOSCOPY     KNEE SURGERY  2002   right   Family History  Problem Relation Age of Onset   Diabetes Mother    Diabetes Father    Diabetes Sister    Diabetes Brother    Alcohol abuse Other    Diabetes Other    Hypertension Other     Social History   Tobacco Use   Smoking status: Never   Smokeless tobacco: Never  Substance Use Topics   Alcohol use: Yes    Alcohol/week: 2.0 standard drinks of alcohol    Types: 2 Standard drinks or equivalent per week    Comment: occasionally   Marital  Status: Married  ROS  Review of Systems  Cardiovascular:  Negative for chest pain, dyspnea on exertion and leg swelling.   Objective  Blood pressure (!) 140/80, pulse (!) 59, resp. rate 17, height '5\' 8"'$  (1.727 m), weight 151 lb 9.6 oz (68.8 kg), SpO2 99 %. Body mass index is 23.05 kg/m.     06/16/2022   11:54 AM 06/15/2022    8:30 AM 05/04/2022    8:30 AM  Vitals with BMI  Height '5\' 8"'$  '5\' 8"'$  '5\' 8"'$   Weight 151 lbs 10 oz 150 lbs 153 lbs  BMI 23.06 [id number] Systolic [id number] Diastolic 80 70 86  Pulse 59 55 55    Physical Exam Neck:     Vascular: No JVD.  Cardiovascular:     Rate and Rhythm: Normal rate and regular rhythm.     Pulses: Intact distal pulses.     Heart sounds: Normal heart sounds. No murmur heard.    No gallop.  Pulmonary:     Effort: Pulmonary effort is normal.     Breath sounds: Normal breath sounds.  Abdominal:     General: Bowel sounds are normal.     Palpations: Abdomen is soft.  Musculoskeletal:     Right lower leg: No  edema.     Left lower leg: No edema.    Medications and allergies   Allergies  Allergen Reactions   Beef-Derived Products     Cultural    Dulaglutide Nausea And Vomiting     Medication list after today's encounter   Current Outpatient Medications:    apixaban (ELIQUIS) 5 MG TABS tablet, Take 1 tablet (5 mg total) by mouth 2 (two) times daily., Disp: 60 tablet, Rfl: 6   Ascorbic Acid (VITAMIN C PO), Take 1 tablet by mouth daily., Disp: , Rfl:    Boswellia-Glucosamine-Vit D (OSTEO BI-FLEX ONE PER DAY PO), Take 1 tablet by mouth daily., Disp: , Rfl:    dronedarone (MULTAQ) 400 MG tablet, TAKE 1 TABLET BY MOUTH TWICE A DAY WITH A MEAL, Disp: 180 tablet, Rfl: 1   glipiZIDE (GLUCOTROL XL) 5 MG 24 hr tablet, Take 1 tablet (5 mg total) by mouth daily with breakfast., Disp: 90 tablet, Rfl: 3   JARDIANCE 25 MG TABS tablet, TAKE 1 TABLET BY MOUTH DAILY BEFORE BREAKFAST, Disp: 30 tablet, Rfl: 1   metFORMIN (GLUCOPHAGE) 1000 MG tablet,  TAKE ONE TABLET BY MOUTH TWICE A DAY, Disp: 180 tablet, Rfl: 3   metoprolol tartrate (LOPRESSOR) 50 MG tablet, Take 1 tablet (50 mg total) by mouth 2 (two) times daily., Disp: 60 tablet, Rfl: 2   Multiple Vitamin (MULTIVITAMIN WITH MINERALS) TABS, Take 1 tablet by mouth daily. Centrum, Disp: , Rfl:    olmesartan (BENICAR) 20 MG tablet, Take 1 tablet (20 mg total) by mouth every evening., Disp: 90 tablet, Rfl: 1   OneTouch Delica Lancets 99991111 MISC, Use to check blood sugar 3 times a day., Disp: 300 each, Rfl: 12   ONETOUCH VERIO test strip, USE ONE STRIP TO TEST THREE TIMES A DAY, Disp: 50 strip, Rfl: 3   rosuvastatin (CRESTOR) 10 MG tablet, Take 10 mg by mouth at bedtime., Disp: , Rfl:    TRADJENTA 5 MG TABS tablet, Take 5 mg by mouth daily., Disp: , Rfl:   Laboratory examination:   Recent Labs    07/15/21 0849 03/22/22 1348  NA 137 139  K 3.6 3.9  CL 105 101  CO2 22 27  GLUCOSE 228* 156*  BUN 14 14  CREATININE 0.90 0.76  CALCIUM 8.7* 10.0  GFRNONAA >60  --       Latest Ref Rng & Units 03/22/2022    1:48 PM 07/15/2021    8:49 AM 01/16/2018   10:08 AM  CMP  Glucose 70 - 99 mg/dL 156  228  134   BUN 8 - 27 mg/dL '14  14  15   '$ Creatinine 0.76 - 1.27 mg/dL 0.76  0.90  0.88   Sodium 134 - 144 mmol/L 139  137  139   Potassium 3.5 - 5.2 mmol/L 3.9  3.6  4.4   Chloride 96 - 106 mmol/L 101  105  102   CO2 20 - 29 mmol/L '27  22  29   '$ Calcium 8.6 - 10.2 mg/dL 10.0  8.7  9.3   Total Protein 6.0 - 8.3 g/dL   7.3   Total Bilirubin 0.2 - 1.2 mg/dL   0.7   Alkaline Phos 39 - 117 U/L   68   AST 0 - 37 U/L   25   ALT 0 - 53 U/L   28       Latest Ref Rng & Units 03/22/2022    1:48 PM 07/15/2021    8:49 AM 01/16/2018  10:08 AM  CBC  WBC 3.4 - 10.8 x10E3/uL 6.7  10.3  3.6   Hemoglobin 13.0 - 17.7 g/dL 14.8  14.3  15.5   Hematocrit 37.5 - 51.0 % 43.9  42.9  44.2   Platelets 150 - 450 x10E3/uL 176  279  136.0     HEMOGLOBIN A1C Lab Results  Component Value Date   HGBA1C 6.7 (A)  06/15/2022   External labs:   Cholesterol, total 98.000 mg 02/03/2022 HDL 16.000 mg 02/03/2022 LDL 45.000 mg 02/03/2022 Triglycerides 184.000 m 02/03/2022  A1C 6.900 % 02/03/2022  TSH 3.500 01/21/2021  Radiology:    Cardiac Studies:   PCV MYOCARDIAL PERFUSION WO LEXISCAN 03/20/2019  No previous exam available for comparison. Lexiscan nuclear stress test performed using 1-day protocol. Myocardial perfusion imaging is normal. Left ventricular ejection fraction is  60% with normal wall motion. Low risk study.  PCV ECHOCARDIOGRAM COMPLETE 07/22/2021  Normal LV systolic function with visual EF 60-65%. Left ventricle cavity is normal in size. Mild concentric hypertrophy of the left ventricle. Normal global wall motion. Unable to evaluate diastolic function due to underlying rhythm; but filling pattern suggestive of normal LAP. No significant valvular heart disease. Compared to study 04/09/2019 no significant change.  Mobile cardiac telemetry 13 days 07/22/2021 - 08/05/2021: Dominant rhythm: Sinus. HR 49-145 bpm. Avg HR 78 bpm, in sinus rhythm. Atrial fibrillation occurred (28% burden), ventricular rate 54-188 bpm, avg 100 bpm. 1 episode of atrial tachcyardia at 143 bpm for 12 beats. <1% isolated SVE, couplet/triplets. 0 episodes of VT. <1% isolated VE, couplets. No atrial fibrillationVT/high grade AV block, sinus pause >3sec noted. 0 patient triggered events.   CT cardiac structure 04/06/2022: CAC score of 1469 which is 98th percentile for age-, race-, and sex-matched controls  Atrial fibrillation ablation 03/31/2022: 1. Comprehensive electrophysiologic study. 2. Coronary sinus pacing and recording. 3. Three-dimensional mapping of atrial fibrillation with additional mapping and ablation of a second discrete focus 4. Ablation of atrial fibrillation with additional mapping and ablation of a second discrete focus  EKG:   EKG 06/16/2022: Normal sinus rhythm/sinus bradycardia rate  of 57 bpm, incomplete right bundle branch block.  Otherwise normal EKG.  Compared to 09/09/2021, no change.  Assessment     ICD-10-CM   1. Paroxysmal atrial fibrillation (HCC)  I48.0 EKG 12-Lead    2. Primary hypertension  I10 olmesartan (BENICAR) 20 MG tablet    3. Elevated coronary artery calcium score 04/06/2022: CAC score 1469, 98th percentile.  R93.1       CHA2DS2-VASc Score is 2.  Yearly risk of stroke: 2.3% (HTN, DM).  Score of 1=0.6; 2=2.2; 3=3.2; 4=4.8; 5=7.2; 6=9.8; 7=>9.8) -(CHF; HTN; vasc disease DM,  Male = 1; Age <65 =0; 65-74 = 1,  >75 =2; stroke/embolism= 2).    There are no discontinued medications.   Meds ordered this encounter  Medications   olmesartan (BENICAR) 20 MG tablet    Sig: Take 1 tablet (20 mg total) by mouth every evening.    Dispense:  90 tablet    Refill:  1   Orders Placed This Encounter  Procedures   EKG 12-Lead   Recommendations:   William Nixon is a 62 y.o.  [place] male patient with diabetes mellitus, hyperlipidemia, presents for follow-up of paroxysmal atrial fibrillation, underwent atrial fibrillation ablation on 03/31/2022 by Dr. Curt Bears.   ***  This is a 63-monthoffice visit, patient and his wife state that he has not had any further episodes of atrial fibrillation over  the past 2 months.  He is wondering whether he could try medical therapy instead of ablation.  I would like to put him on Multaq 400 mg p.o. twice daily.  He will come for an EKG in 2 weeks.  He could certainly consider not having atrial fibrillation ablation if he tolerates medication without recurrence of atrial fibrillation.  As he is very active, likes to garden, he likes to run regularly, did not want to use flecainide.  Other option would be dofetilide that needs hospitalization.  He is tolerating anticoagulation.  I have discussed with him and his wife that with regard to anticoagulation, in view of recurrence of atrial fibrillation, probably best option  to continue long-term anticoagulation, although his CHA2DS2-VASc risk score is 2.0.  In 2 to 6 months if he has not had any recurrence of atrial fibrillation, we could consider stopping the anticoagulation by shared decision making.  For now advised him to continue the same. He does have severe obstructive sleep apnea and he is compliant with CPAP.    Adrian Prows, MD, [facility] 06/16/2022, 12:19 PM Office: [phone]

## 2022-07-01 ENCOUNTER — Encounter: Payer: Self-pay | Admitting: Cardiology

## 2022-07-01 ENCOUNTER — Ambulatory Visit: Payer: 59 | Attending: Cardiology | Admitting: Cardiology

## 2022-07-01 VITALS — BP 124/78 | HR 69 | Ht 68.0 in | Wt 152.0 lb

## 2022-07-01 DIAGNOSIS — D6869 Other thrombophilia: Secondary | ICD-10-CM

## 2022-07-01 DIAGNOSIS — I480 Paroxysmal atrial fibrillation: Secondary | ICD-10-CM | POA: Diagnosis not present

## 2022-07-01 NOTE — Patient Instructions (Addendum)
Medication Instructions:  Your physician has recommended you make the following change in your medication:  STOP Multaq  *If you need a refill on your cardiac medications before your next appointment, please call your pharmacy*   Lab Work: None ordered   Testing/Procedures: None ordered   Follow-Up: At [facility], you and your health needs are our priority.  As part of our continuing mission to provide you with exceptional heart care, we have created designated Provider Care Teams.  These Care Teams include your primary Cardiologist (physician) and Advanced Practice Providers (APPs -  Physician Assistants and Nurse Practitioners) who all work together to provide you with the care you need, when you need it.  Your next appointment:   6 month(s)  The format for your next appointment:   In Person  Provider:   You will see one of the following Advanced Practice Providers on your designated Care Team:   Tommye Standard, [place] Legrand Como "Jonni Sanger" Chalmers Cater, [place]    Thank you for choosing [facility]!!   Trinidad Curet, RN [phone]

## 2022-07-01 NOTE — Progress Notes (Signed)
Electrophysiology Office Note   Date:  07/01/2022   ID:  William Nixon, DOB Jan 28, 1961, MRN [id number]  PCP:  William Solian, MD  Cardiologist:  William Nixon Primary Electrophysiologist:  William Haw, MD    Chief Complaint: AF   History of Present Illness: William Nixon is a 62 y.o. male who is being seen today for the evaluation of AF at the request of Avva, Ravisankar, MD. Presenting today for electrophysiology evaluation.  He has a history seen for type 2 diabetes, hypertension, hyperlipidemia.  He also has sleep apnea and has been using CPAP.  He presented to the emergency room 07/15/2021 with syncope and new onset atrial fibrillation with rapid rates.  He is now status post atrial fibrillation ablation 03/31/2022.  Today, denies symptoms of palpitations, chest pain, shortness of breath, orthopnea, PND, lower extremity edema, claudication, dizziness, presyncope, syncope, bleeding, or neurologic sequela. The patient is tolerating medications without difficulties.  Since being seen he has done well.  He has noted no further episodes of atrial fibrillation since his ablation.  He is able to exercise without issue.  I have told him there are no restrictions on exercise.    Past Medical History:  Diagnosis Date   Diabetes mellitus    type II   Hyperlipidemia    pt. states that hdl ldl was elevated denies hyperlipidemia   Hypertension    Hyperthyroidism    pt. denies having any thyroid issues   Paroxysmal atrial fibrillation (HCC)    Personal history of colonic polyps - adenomas 08/06/2013   07/2013 - 3 diminutive adenomas - repeat colonoscopy 2018   Right hydrocele    S/P ACL repair    right    Past Surgical History:  Procedure Laterality Date   ATRIAL FIBRILLATION ABLATION N/A 03/31/2022   Procedure: ATRIAL FIBRILLATION ABLATION;  Surgeon: William Haw, MD;  Location: [facility];  Service: Cardiovascular;  Laterality: N/A;   COLONOSCOPY      KNEE SURGERY  2002   right     Current Outpatient Medications  Medication Sig Dispense Refill   apixaban (ELIQUIS) 5 MG TABS tablet Take 1 tablet (5 mg total) by mouth 2 (two) times daily. 60 tablet 6   Ascorbic Acid (VITAMIN C PO) Take 1 tablet by mouth daily.     Boswellia-Glucosamine-Vit D (OSTEO BI-FLEX ONE PER DAY PO) Take 1 tablet by mouth daily.     glipiZIDE (GLUCOTROL XL) 5 MG 24 hr tablet Take 1 tablet (5 mg total) by mouth daily with breakfast. 90 tablet 3   glipiZIDE (GLUCOTROL) 5 MG tablet Take 5 mg by mouth daily before breakfast.     JARDIANCE 25 MG TABS tablet TAKE 1 TABLET BY MOUTH DAILY BEFORE BREAKFAST 30 tablet 1   metFORMIN (GLUCOPHAGE) 1000 MG tablet TAKE ONE TABLET BY MOUTH TWICE A DAY 180 tablet 3   Multiple Vitamin (MULTIVITAMIN WITH MINERALS) TABS Take 1 tablet by mouth daily. Centrum     olmesartan (BENICAR) 20 MG tablet Take 1 tablet (20 mg total) by mouth every evening. 90 tablet 1   OneTouch Delica Lancets 99991111 MISC Use to check blood sugar 3 times a day. 300 each 12   ONETOUCH VERIO test strip USE ONE STRIP TO TEST THREE TIMES A DAY 50 strip 3   rosuvastatin (CRESTOR) 10 MG tablet Take 10 mg by mouth at bedtime.     TRADJENTA 5 MG TABS tablet Take 5 mg by mouth daily.  metoprolol tartrate (LOPRESSOR) 50 MG tablet Take 1 tablet (50 mg total) by mouth 2 (two) times daily. (Patient not taking: Reported on 07/01/2022) 60 tablet 2   No current facility-administered medications for this visit.    Allergies:   Beef-derived products and Dulaglutide   Social History:  The patient  reports that he has never smoked. He has never used smokeless tobacco. He reports current alcohol use of about 2.0 standard drinks of alcohol per week. He reports that he does not use drugs.   Family History:  The patient's family history includes Alcohol abuse in an other family member; Diabetes in his brother, father, mother, sister, and another family member; Hypertension in an other  family member.   ROS:  Please see the history of present illness.   Otherwise, review of systems is positive for none.   All other systems are reviewed and negative.   PHYSICAL EXAM: VS:  BP (!) 144/84   Pulse 69   Ht 5\' 8"  (1.727 m)   Wt 152 lb (68.9 kg)   SpO2 99%   BMI 23.11 kg/m  , BMI Body mass index is 23.11 kg/m. GEN: Well nourished, well developed, in no acute distress  HEENT: normal  Neck: no JVD, carotid bruits, or masses Cardiac: RRR; no murmurs, rubs, or gallops,no edema  Respiratory:  clear to auscultation bilaterally, normal work of breathing GI: soft, nontender, nondistended, + BS MS: no deformity or atrophy  Skin: warm and dry Neuro:  Strength and sensation are intact Psych: euthymic mood, full affect  EKG:  EKG is ordered today. Personal review of the ekg ordered shows sinus rhythm   Recent Labs: 07/15/2021: Magnesium 1.9 03/22/2022: BUN 14; Creatinine, Ser 0.76; Hemoglobin 14.8; Platelets 176; Potassium 3.9; Sodium 139    Lipid Panel     Component Value Date/Time   CHOL 163 01/16/2018 1008   TRIG 165.0 (H) 01/16/2018 1008   HDL 27.00 (L) 01/16/2018 1008   CHOLHDL 6 01/16/2018 1008   VLDL 33.0 01/16/2018 1008   LDLCALC 103 (H) 01/16/2018 1008   LDLDIRECT 96.9 11/07/2012 0806     Wt Readings from Last 3 Encounters:  07/01/22 152 lb (68.9 kg)  06/16/22 151 lb 9.6 oz (68.8 kg)  06/15/22 150 lb (68 kg)      Other studies Reviewed: Additional studies/ records that were reviewed today include: TTE 07/25/21  Review of the above records today demonstrates:  Normal LV systolic function with visual EF 60-65%. Left ventricle cavity  is normal in size. Mild concentric hypertrophy of the left ventricle.  Normal global wall motion. Unable to evaluate diastolic function due to  underlying rhythm; but filling pattern suggestive of normal LAP.  No significant valvular heart disease.   Cardiac monitor 10/08/2021 personally reviewed Predominant rhythm is normal  sinus rhythm.  There are brief atrial tachycardia episodes.  Atrial fibrillation occurred with a heart rate ranging from 51 bpm to 193 bpm with average of 101 bpm.  Longest atrial fibrillation was 20 hours and 13 minutes with average heart rate of 97 bpm.  This constituted 28% atrial fibrillation burden.  1 patient triggered event which correlated with PACs.  There was no heart block, significant ventricular arrhythmias. There were rare and PVCs.  ASSESSMENT AND PLAN:  1.  Paroxysmal atrial fibrillation: Status post ablation 03/31/2022.  CHA2DS2-VASc of 2.  Currently on Eliquis and Multaq.  He is currently feeling well and has had no further episodes of atrial fibrillation.  Jhase Creppel stop Multaq today.  2.  Secondary hypercoagulable state: Currently on Eliquis for atrial fibrillation   Current medicines are reviewed at length with the patient today.   The patient does not have concerns regarding his medicines.  The following changes were made today: Stop Multaq  Labs/ tests ordered today include:  Orders Placed This Encounter  Procedures   EKG 12-Lead     Disposition:   FU 6 months  Signed, Zya Finkle Meredith Leeds, MD  07/01/2022 11:29 AM     [facility] [address] [place] [address] [phone] (office) [phone] (fax)

## 2022-07-11 LAB — BASIC METABOLIC PANEL
BUN/Creatinine Ratio: 21 (ref 10–24)
BUN: 15 mg/dL (ref 8–27)
CO2: 24 mmol/L (ref 20–29)
Calcium: 9.6 mg/dL (ref 8.6–10.2)
Chloride: 99 mmol/L (ref 96–106)
Creatinine, Ser: 0.71 mg/dL — ABNORMAL LOW (ref 0.76–1.27)
Glucose: 174 mg/dL — ABNORMAL HIGH (ref 70–99)
Potassium: 4.3 mmol/L (ref 3.5–5.2)
Sodium: 137 mmol/L (ref 134–144)
eGFR: 105 mL/min/{1.73_m2} (ref >59)

## 2022-07-11 LAB — LIPOPROTEIN A (LPA): Lipoprotein (a): <8.4 nmol/L (ref <75.0)

## 2022-07-21 ENCOUNTER — Other Ambulatory Visit: Payer: Self-pay | Admitting: Internal Medicine

## 2022-08-09 ENCOUNTER — Encounter: Payer: Self-pay | Admitting: Internal Medicine

## 2022-08-10 ENCOUNTER — Other Ambulatory Visit: Payer: Self-pay | Admitting: Internal Medicine

## 2022-08-10 MED ORDER — TRADJENTA 5 MG PO TABS: 5.0000 mg | ORAL_TABLET | Freq: Every day | ORAL | 3 refills | Status: DC

## 2022-09-01 NOTE — Progress Notes (Signed)
PATIENT: William Nixon DOB: 08/23/1960  REASON FOR VISIT: follow up HISTORY FROM: patient  Virtual Visit via Telephone Note  I connected with William Nixon on 09/06/22 at  8:45 AM EDT by telephone and verified that I am speaking with the correct person using two identifiers.   I discussed the limitations, risks, security and privacy concerns of performing an evaluation and management service by telephone and the availability of in person appointments. I also discussed with the patient that there may be a patient responsible charge related to this service. The patient expressed understanding and agreed to proceed.   History of Present Illness:  09/06/22 ALL: William Nixon is a 62 y.o. male here today for follow up for OSA on CPAP. He continues to do well on therapy. He is unsure if he has noticed any significant benefit. He denies any specific concerns with machine or supplies. He is s/p ablation 03/2022. He is followed closely by PCP and cardiology.     History (copied from Dr Dohmeier's previous note)  William Nixon,  is a 62  year- old Asian male patient of [place] descent , and seen in a RV on 08/23/2021. Dr Jacinto Halim saw him on 08-12-2021: after a syncope and low BP, dehydrated and was on decongestants.  He is interested in hearing about INSPIRE;    He underwent sleep testing here last year -his sleep test actually was performed on 07/24/2019 he had a moderate degree of sleep apnea obstructive with an AHI of 19.6 some snoring was recorded no REM sleep accentuation was noted.  There was no note of hypoxemia.   So this patient could potentially be a candidate for inspire device.  Looking at his current compliance. In spite of not exactly living his CPAP he has been a highly compliant user use the machine 20 out of 30 days with a compliance and hours of 93%.  Average user time is 6 hours and 46 minutes and he fulfills all insurance that compliance data.     His machine  was set up on 05-22-2019.  It is an AutoSet AirSense 10 with a minimum pressure of 5 and a maximum pressure of 16 cm water ,  as well as 3 cm EPR level.  The residual apnea is 0 point 0.8/h which is a good resolution of apnea.  95th percentile pressure is 11 cmH2O well within the current settings.  Air leak is low at the 95th percentile leaks only 1.2 L a minute that is an excellent fit in terms of mask.  So basically I would hesitate to recommend an alternative therapy because this 1 seems to work so well.  The patient states that when he had his syncope spell it was also discovered that he still was in atrial fibrillation.  He has not been undergoing cardiac ablation or cardiac "reset', just beta blocker rate controlled.    Inspire works in non-REM sleep apnea without hypoxia. He is less interested in a surgical procedure.    Observations/Objective:  Generalized: Well developed, in no acute distress  Mentation: Alert oriented to time, place, history taking. Follows all commands speech and language fluent   Assessment and Plan:  62 y.o. year old male  has a past medical history of Diabetes mellitus, Hyperlipidemia, Hypertension, Hyperthyroidism, Paroxysmal atrial fibrillation (HCC), Personal history of colonic polyps - adenomas (08/06/2013), Right hydrocele, and S/P ACL repair. here with    ICD-10-CM   1. OSA on CPAP  G47.33  William Nixon continues to do well on CPAP therapy. Compliance report reveals excellent compliance. He was encouraged to continue using CPAP nightly for at least 4 hours. Supply orders have been updated. Healthy lifestyle habits encouraged. He will return to see [place] in 1 year, sooner if needed.   No orders of the defined types were placed in this encounter.   No orders of the defined types were placed in this encounter.    Follow Up Instructions:  I discussed the assessment and treatment plan with the patient. The patient was provided an opportunity to ask  questions and all were answered. The patient agreed with the plan and demonstrated an understanding of the instructions.   The patient was advised to call back or seek an in-person evaluation if the symptoms worsen or if the condition fails to improve as anticipated.  I provided 15 minutes of non-face-to-face time during this encounter. Patient located at their place of residence during Mychart visit. Provider is in the office.    Shawnie Dapper, NP

## 2022-09-01 NOTE — Patient Instructions (Signed)

## 2022-09-02 ENCOUNTER — Other Ambulatory Visit: Payer: Self-pay | Admitting: Internal Medicine

## 2022-09-05 ENCOUNTER — Encounter: Payer: Self-pay | Admitting: *Deleted

## 2022-09-06 ENCOUNTER — Telehealth (INDEPENDENT_AMBULATORY_CARE_PROVIDER_SITE_OTHER): Payer: 59 | Admitting: Family Medicine

## 2022-09-06 ENCOUNTER — Encounter: Payer: Self-pay | Admitting: Family Medicine

## 2022-09-06 DIAGNOSIS — G4733 Obstructive sleep apnea (adult) (pediatric): Secondary | ICD-10-CM | POA: Diagnosis not present

## 2022-09-08 ENCOUNTER — Telehealth: Payer: Self-pay | Admitting: Family Medicine

## 2022-09-08 NOTE — Telephone Encounter (Signed)
Pt said he was asked to call in and provide his insurance information: [employer] ZO:[id number] Payer FA#[phone] RX PCN#[id number] RXGroup#[id number]

## 2022-10-18 ENCOUNTER — Encounter: Payer: Self-pay | Admitting: Internal Medicine

## 2022-10-19 ENCOUNTER — Other Ambulatory Visit: Payer: Self-pay | Admitting: Internal Medicine

## 2022-10-19 MED ORDER — FLUCONAZOLE 150 MG PO TABS: 150.0000 mg | ORAL_TABLET | Freq: Once | ORAL | 1 refills | Status: AC

## 2022-10-19 NOTE — Telephone Encounter (Signed)
I called and spoke with the patient. The symptoms he has described to me is more like Yeast (itching, Redness, cracking skin,white buildup, denies Odor) He spoke with Dr. Felipa Eth and he advised for him to use some anti-itch cream (Miconazole 7) on the area. This has been going on for 3-4 months per the patient. Pt also states that he has stopped using the OTC and started using Aquaphor. Please advise

## 2022-11-06 ENCOUNTER — Other Ambulatory Visit: Payer: Self-pay | Admitting: Internal Medicine

## 2022-11-07 ENCOUNTER — Other Ambulatory Visit: Payer: Self-pay | Admitting: Internal Medicine

## 2022-11-07 MED ORDER — FLUCONAZOLE 150 MG PO TABS: 150.0000 mg | ORAL_TABLET | Freq: Once | ORAL | 1 refills | Status: AC

## 2022-11-08 ENCOUNTER — Other Ambulatory Visit: Payer: Self-pay | Admitting: Internal Medicine

## 2022-11-08 DIAGNOSIS — E119 Type 2 diabetes mellitus without complications: Secondary | ICD-10-CM

## 2022-11-08 MED ORDER — GLIPIZIDE ER 5 MG PO TB24
10.0000 mg | ORAL_TABLET | Freq: Every day | ORAL | 3 refills | Status: DC
Start: 2022-11-08 — End: 2023-05-16

## 2022-11-27 ENCOUNTER — Other Ambulatory Visit: Payer: Self-pay | Admitting: Internal Medicine

## 2022-11-30 NOTE — Telephone Encounter (Signed)
Please Advise,

## 2022-12-04 ENCOUNTER — Telehealth: Payer: Self-pay | Admitting: Internal Medicine

## 2022-12-04 DIAGNOSIS — Z86010 Personal history of colonic polyps: Secondary | ICD-10-CM

## 2022-12-04 NOTE — Telephone Encounter (Signed)
Patient was seen in Jan but we deferred colonoscopy because of recent A fib ablation wich would preclude holding anti-coagulation  1) Call patient and explain we should schedule a colonoscopy if willing  2) Obtain anti-coag hold clearance from Dr. Raul Del [facility]  Encounter Diagnosis  Name Primary?   Personal history of colonic polyps - adenomas Yes   '

## 2022-12-05 NOTE — Telephone Encounter (Signed)
Left message for pt to call back  °

## 2022-12-06 ENCOUNTER — Telehealth: Payer: Self-pay

## 2022-12-06 NOTE — Telephone Encounter (Signed)
Patient with diagnosis of atrial fibrillation on Eliquis for anticoagulation.    Procedure: colonoscopy Date of procedure: 01/23/23   CHA2DS2-VASc Score = 3   This indicates a 3.2% annual risk of stroke. The patient's score is based upon: CHF History: 0 HTN History: 1 Diabetes History: 1 Stroke History: 0 Vascular Disease History: 1 Age Score: 0 Gender Score: 0   CrCl 106 Platelet count 176  Per office protocol, patient can hold Eliquis for 2 days prior to procedure.   Patient will not need bridging with Lovenox (enoxaparin) around procedure.  **This guidance is not considered finalized until pre-operative APP has relayed final recommendations.**

## 2022-12-06 NOTE — Telephone Encounter (Signed)
[facility] Pre-operative Risk Assessment     Request for surgical clearance:     Endoscopy Procedure  What type of surgery is being performed?     Colonoscopy  When is this surgery scheduled?     01/23/2023  What type of clearance is required ?   Pharmacy/ Medical  Are there any medications that need to be held prior to surgery and how long? Eliquis /2 days   Practice name and name of physician performing surgery?      [facility]/ Dr. Leone Payor  What is your office phone and fax number?      Phone- [phone]  Fax- [phone]  Anesthesia type (None, local, MAC, general) ?       MAC

## 2022-12-06 NOTE — Telephone Encounter (Signed)
Clearance faxed over to [facility]  Fax: [phone]

## 2022-12-06 NOTE — Telephone Encounter (Signed)
Preoperative team, this patient is a Dr. Jacinto Halim patient.  Please forward request to [facility] cardiology for evaluation/review.  Pharmacy has provided recommendations for holding anticoagulation.  Thank you for your help.  Thomasene Ripple. Royalty Domagala NP-C     12/06/2022, 11:18 AM [facility] [address] Office [phone] Fax [phone]

## 2022-12-06 NOTE — Telephone Encounter (Signed)
Pt made aware of Dr. Leone Payor recommendations: Pt was scheduled for a Colonoscopy on 01/23/2023 at 8:00 AM with Dr. Leone Payor. Pt made aware. Pt was scheduled for a pre visit on 12/30/2022 at 8:00 AM.  Pt made aware Medical clearance along with Eliquis sent to [facility]. Pt made aware Pt verbalized understanding with all questions answered.

## 2022-12-07 ENCOUNTER — Ambulatory Visit: Payer: 59 | Admitting: Internal Medicine

## 2022-12-07 ENCOUNTER — Encounter: Payer: Self-pay | Admitting: Internal Medicine

## 2022-12-07 VITALS — BP 142/80 | HR 65 | Ht 68.0 in | Wt 154.8 lb

## 2022-12-07 DIAGNOSIS — E041 Nontoxic single thyroid nodule: Secondary | ICD-10-CM

## 2022-12-07 DIAGNOSIS — E1169 Type 2 diabetes mellitus with other specified complication: Secondary | ICD-10-CM | POA: Diagnosis not present

## 2022-12-07 DIAGNOSIS — E785 Hyperlipidemia, unspecified: Secondary | ICD-10-CM | POA: Diagnosis not present

## 2022-12-07 DIAGNOSIS — E119 Type 2 diabetes mellitus without complications: Secondary | ICD-10-CM

## 2022-12-07 DIAGNOSIS — Z7984 Long term (current) use of oral hypoglycemic drugs: Secondary | ICD-10-CM | POA: Diagnosis not present

## 2022-12-07 LAB — POCT GLYCOSYLATED HEMOGLOBIN (HGB A1C): Hemoglobin A1C: 7.7 % — AB (ref 4.0–5.6)

## 2022-12-07 NOTE — Patient Instructions (Addendum)
Please use the following regimen: Medicine  Breakfast Lunch Dinner  Metformin M  M  Glipizide   G -5  Glipizide XL G XL -10    Jardiance J - 10    Tradjeta T      Try to add back Jardiance 10 mg daily in am.  Please return in 4 months with your sugar log.

## 2022-12-07 NOTE — Progress Notes (Signed)
Patient ID: William Nixon, male   DOB: 1960/12/23, 62 y.o.   MRN: [id number]  HPI: William Nixon is a 62 y.o.-year-old male, returning for f/u DM2, dx in ~1998, non-insulin-dependent, well controlled, without long-term complications. Last visit 6 months ago.  Interim history: Since last visit, he had cardiac ablation for atrial fibrillation 03/31/2022. After his ablation, he resumed exercise: cardio, weights. No increased urination, blurry vision, nausea, chest pain. Since last visit, he contacted me with UTI and perineal rash.  We held [place] and he also used Ciclopirox 0.77% cream and Zeosorb by PCP along with Diflucan for 1 week.  This resolved.  DM2: Reviewed latest HbA1c levels: Lab Results  Component Value Date   HGBA1C 6.7 (A) 06/15/2022   HGBA1C 6.7 (A) 09/07/2021   HGBA1C 7.0 (A) 04/05/2021  01/18/2022: HbA1c 6.9% 01/21/2021: HbA1c 7.1% 02/14/2020: HbA1c 6.6% During summer months, as he is more active, his HbA1c is usually lower.  He is on: Medicine  Breakfast Lunch Dinner  Metformin M  M  Glipizide   G -5  Glipizide XL G XL 5 >> 10       Januvia/Tradjenta J/T     He also tried herbal remedies: - mustard seed daily >> helps a lot - bitter melon once a week.  He tried Rybelsus >> N/V/weight loss. Tried Trulicity >> N/V and Glu in the 200s. He feels that his sugar control deteriorated after stopping glipizide XL before visit from 12/21 so we started this back.  Pt checks his sugars 1-2 times a day: - am:  90, 132-140 (eats dinner later at night) >> 109-138, 162 >> 150-165 - 2h after b'fast: n/c  - before lunch: n/c >> 103-165 >> n/c  - 2h after lunch: <170 >> 133-183 >> 170-180 >> 137, 177, 205 >> 190-210 - before dinner: 92-114 >> n/c - 2h after dinner: 170-180 >> 162-172 >> n/c >> <170 >> n/c  - bedtime: n/c - nighttime: n/c Lowest sugar was 80s >> 94 >> 98 >> 108 >> 90 >> 90s >> 120; he has hypoglycemia awareness in the 80s. Highest sugar was 165  >> 183 >> 180 >> 205 >> 220.  Glucometer: AccuChek (?[employer]iva)  His meals are mostly vegetarian.  He continues to walk/run.  -No CKD, last BUN/creatinine: 01/18/2022: 14/0.8, GFR 98.6, glucose 144 01/21/2021: 17/1.0, GFT 76.5, glucose 169 02/11/2020: 12/0.8, GFR 99.3, glucose 129 08/12/2019: 14/0.9, glucose 187 Lab Results  Component Value Date   BUN 15 07/08/2022   CREATININE 0.71 (L) 07/08/2022  Prev. On Olmesartan 40 then  losartan 25 << 100 << 50. Now off.  -No persistent microalbuminuria 01/18/2022: ACR 40 Lab Results  Component Value Date   MICRALBCREAT 2.1 01/16/2018   MICRALBCREAT 2.5 01/10/2017   MICRALBCREAT 2.1 01/06/2016   MICRALBCREAT 2.3 09/10/2014   MICRALBCREAT 0.0 04/30/2014   MICRALBCREAT 0.6 10/02/2013   MICRALBCREAT 1.3 05/29/2013   MICRALBCREAT 0.8 11/07/2012   MICRALBCREAT 0.7 11/02/2011   MICRALBCREAT 1.2 04/27/2011   -+ HL; last set of lipids: 01/18/2022: 98/184/16/45 01/21/2021: 124/174/24/65 02/11/2020: 130/104/28/81 03/22/2019: 109/114/29/57 Lab Results  Component Value Date   CHOL 163 01/16/2018   HDL 27.00 (L) 01/16/2018   LDLCALC 103 (H) 01/16/2018   LDLDIRECT 96.9 11/07/2012   TRIG 165.0 (H) 01/16/2018   CHOLHDL 6 01/16/2018  On Crestor 10.  - last eye exam was in 05/2022: no DR reportedly (Dr. Fawn Kirk).   -No numbness and tingling in his feet.  Last foot exam 08/2021.  Thyroid nodule: -  Discovered incidentally, on CT.  Thyroid U/S (08/13/2021): Parenchymal Echotexture: Mildly heterogenous  Isthmus: 4 mm  Right lobe:  4.4 x 1.4 x 1.5 cm  Left lobe: 4.7 x 1.4 x 1.8 cm  _________________________________________________________   Estimated total number of nodules >/= 1 cm: 1  _________________________________________________________   Nodule # 1:  Location: Left; Inferior  Maximum size: 1.3 cm; Other 2 dimensions: 1.1 x 1.0 cm  Composition: mixed cystic and solid (1)  Echogenicity: hypoechoic (2) Given size (<1.4 cm) and  appearance, this nodule does NOT meet TI-RADS criteria for biopsy or dedicated follow-up.  _________________________________________________________   No additional thyroid abnormality. No hypervascularity. Negative for adenopathy.   IMPRESSION: 1.3 cm left inferior thyroid TR 3 nodule does not meet criteria for biopsy or follow-up. This correlates with the chest CT finding.   No other significant abnormality by ultrasound.  Pt denies: - feeling nodules in neck - hoarseness - dysphagia - choking  Also: 01/18/2022: TSH 3.31, free T4 1.6 01/21/2021: TSH 3.5 02/11/2020: TSH 3.44 TSH 3.64 and free T4 1.2, both normal, on 08/12/2019. Lab Results  Component Value Date   TSH 5.19 (H) 01/16/2018   No FH of thyroid cancer. No h/o radiation tx to head or neck. No herbal supplements. No Biotin use. No steroids use.   He had a syncopal episode in 02/2019 with loss of consciousness for 1 to 2 minutes - he saw cardiology (Dr. Derrill Center) and this was considered a vasovagal syncope.  He was then found to have paroxysmal A. Fib 2/2 moderate OSA (now on a CPAP). He was started on a blood thinner (now off), also on statin and diltiazem. Still on Multaq.  He had another syncopal episode 07/15/2021.  She was found to be hypotensive (dehydration + overtreatment with antihypertensive medications) at that time.  After the last episode, we decreased the dose of Jardiance to half a 25 mg tablet >> then back to 25 mg. Also on Metoprolol, Eliquis.  He started a new job in 06/2016.  Now working from home.  Much less stress.  ROS: + See HPI  I reviewed pt's medications, allergies, PMH, social hx, family hx, and changes were documented in the history of present illness. Otherwise, unchanged from my initial visit note.  Past Medical History:  Diagnosis Date   Diabetes mellitus    type II   Hyperlipidemia    pt. states that hdl ldl was elevated denies hyperlipidemia   Hypertension    Hyperthyroidism     pt. denies having any thyroid issues   Paroxysmal atrial fibrillation (HCC)    Personal history of colonic polyps - adenomas 08/06/2013   07/2013 - 3 diminutive adenomas - repeat colonoscopy 2018   Right hydrocele    S/P ACL repair    right    Past Surgical History:  Procedure Laterality Date   ATRIAL FIBRILLATION ABLATION N/A 03/31/2022   Procedure: ATRIAL FIBRILLATION ABLATION;  Surgeon: Regan Lemming, MD;  Location: [facility];  Service: Cardiovascular;  Laterality: N/A;   COLONOSCOPY     KNEE SURGERY  2002   right   History   Social History   Marital Status: Married    Spouse Name: N/A    Number of Children: 1   Occupational History   controller   Social History Main Topics   Smoking status: Never Smoker    Smokeless tobacco: Never Used   Alcohol Use:     1 drink(s) per week   Drug Use: No  Current Outpatient Medications on File Prior to Visit  Medication Sig Dispense Refill   apixaban (ELIQUIS) 5 MG TABS tablet Take 1 tablet (5 mg total) by mouth 2 (two) times daily. 60 tablet 6   Ascorbic Acid (VITAMIN C PO) Take 1 tablet by mouth daily.     Boswellia-Glucosamine-Vit D (OSTEO BI-FLEX ONE PER DAY PO) Take 1 tablet by mouth daily.     glipiZIDE (GLUCOTROL XL) 5 MG 24 hr tablet Take 2 tablets (10 mg total) by mouth daily with breakfast. 180 tablet 3   glipiZIDE (GLUCOTROL) 5 MG tablet Take 5 mg by mouth daily before breakfast.     JARDIANCE 25 MG TABS tablet TAKE 1 TABLET BY MOUTH DAILY BEFORE BREAKFAST 30 tablet 3   metFORMIN (GLUCOPHAGE) 1000 MG tablet TAKE ONE TABLET BY MOUTH TWICE A DAY 180 tablet 3   metoprolol tartrate (LOPRESSOR) 50 MG tablet Take 1 tablet (50 mg total) by mouth 2 (two) times daily. (Patient not taking: Reported on 07/01/2022) 60 tablet 2   Multiple Vitamin (MULTIVITAMIN WITH MINERALS) TABS Take 1 tablet by mouth daily. Centrum     olmesartan (BENICAR) 20 MG tablet Take 1 tablet (20 mg total) by mouth every evening. 90 tablet 1    OneTouch Delica Lancets 33G MISC Use to check blood sugar 3 times a day. 300 each 12   ONETOUCH VERIO test strip USE 1 STRIP TO TEST 3 TIMES A DAY 50 strip 3   rosuvastatin (CRESTOR) 10 MG tablet Take 10 mg by mouth at bedtime.     TRADJENTA 5 MG TABS tablet Take 1 tablet (5 mg total) by mouth daily. 90 tablet 3   No current facility-administered medications on file prior to visit.   Allergies  Allergen Reactions   Beef-Derived Products     Cultural    Dulaglutide Nausea And Vomiting   Family History  Problem Relation Age of Onset   Diabetes Mother    Diabetes Father    Diabetes Sister    Diabetes Brother    Alcohol abuse Other    Diabetes Other    Hypertension Other    PE: BP (!) 142/80   Pulse 65   Ht 5\' 8"  (1.727 m)   Wt 154 lb 12.8 oz (70.2 kg)   SpO2 98%   BMI 23.54 kg/m   Wt Readings from Last 3 Encounters:  12/07/22 154 lb 12.8 oz (70.2 kg)  07/01/22 152 lb (68.9 kg)  06/16/22 151 lb 9.6 oz (68.8 kg)   Constitutional: normal weight, in NAD Eyes:  EOMI, no exophthalmos ENT: no neck masses, no cervical lymphadenopathy Cardiovascular: RRR, No MRG Respiratory: CTA B Musculoskeletal: no deformities Skin:no rashes Neurological: no tremor with outstretched hands Diabetic Foot Exam - Simple   Simple Foot Form Diabetic Foot exam was performed with the following findings: Yes 12/07/2022  8:55 AM  Visual Inspection No deformities, no ulcerations, no other skin breakdown bilaterally: Yes Sensation Testing Intact to touch and monofilament testing bilaterally: Yes Pulse Check Posterior Tibialis and Dorsalis pulse intact bilaterally: Yes Comments    ASSESSMENT: 1. DM2, non-insulin-dependent, controlled, without complications - also FH of mild DM on mother's side  2. HL  3.  Left thyroid nodule  PLAN:  1. Patient with longstanding, controlled type 2 diabetes, on metformin, sulfonylurea, DPP 4 inhibitor and also SGLT2 inhibitor, with an HbA1c of 6.7%, lower, at  last visit.  At that time, sugars are mostly at goal with only occasional hyperglycemic spikes after large dinners.  We did not change his regimen at that time.  He continues to do a good job exercising consistently.  He had less stress after he started to work from home. -Since last visit, he developed UTI and rash which persisted despite Diflucan.  He was treated with antifungal cream, also, by PCP.  Symptoms resolved.  He stopped Jardiance and remains off SGLT2 inhibitor for now. -At today's visit, sugars are high whenever he checks.  We discussed about options for treatment.  He would not want to try a GLP-1 receptor agonist due to significantly decreased appetite along with nausea, weight loss.  We discussed about bromocriptine, acarbose, and even insulin, but ultimately decided to try to add back Jardiance at the lower dose and increase as tolerated.  Will continue the higher dose of glipizide for now, which we increased after he came off Jardiance due to increased blood sugars. - I suggested to:  Patient Instructions  Please use the following regimen: Medicine  Breakfast Lunch Dinner  Metformin M  M  Glipizide   G -5  Glipizide XL G XL -10    Jardiance J - 10    Tradjeta T      Try to add back Jardiance 10 mg daily in am.  Please return in 4 months with your sugar log.    - we checked his HbA1c: 7.7% (higher) - advised to check sugars at different times of the day - 1x a day, rotating check times - advised for yearly eye exams >> he is UTD -ACR was elevated, while off ACE inhibitor/ARB, to allow metoprolol dose to be increased for heart rate control.  However, he had an ablation afterwards and he is off metoprolol.  He restarted Cozaar. - return to clinic in 6 months  2. HL - Reviewed latest lipid panel from 01/18/2022: LDL at goal, HDL very low, triglycerides slightly high:98/184/16/45 - Continues Crestor 10 mg daily without side effects.  3.  Left thyroid nodule -Incidentally seen  on CT scan, then on the dedicated thyroid ultrasound.  Nodule appears to be situated in the left inferior gland, and it is solid/cystic, hypoechoic.  It measures 1.3 x 1.1 x 1 cm -Since he does not have any high features, no further imaging follow-up is necessary for this, we are following it clinically -Neck compression symptoms or masses felt on palpation of his neck today  Carlus Pavlov, MD PhD [facility]

## 2022-12-07 NOTE — Addendum Note (Signed)
Addended by: Pollie Meyer on: 12/07/2022 09:12 AM   Modules accepted: Orders

## 2022-12-14 ENCOUNTER — Ambulatory Visit: Payer: 59 | Admitting: Internal Medicine

## 2022-12-22 ENCOUNTER — Telehealth: Payer: Self-pay

## 2022-12-22 ENCOUNTER — Ambulatory Visit: Payer: 59 | Admitting: Cardiology

## 2022-12-22 NOTE — Telephone Encounter (Signed)
William Nixon,   I am following up on this Eliquis hold. I do see where [facility] advised a 2 day would be ok but the patient sees Dr. Jacinto Halim at [facility]. It was recommended it be sent to his office for Evaluation and review. Have you received anything from cardiology office confirming if the 2 day hold was ok?  Thank you, Previsit

## 2022-12-26 ENCOUNTER — Encounter: Payer: Self-pay | Admitting: Cardiology

## 2022-12-26 ENCOUNTER — Ambulatory Visit: Payer: 59 | Admitting: Cardiology

## 2022-12-26 VITALS — BP 131/76 | HR 69 | Resp 16 | Ht 68.0 in | Wt 153.8 lb

## 2022-12-26 DIAGNOSIS — I10 Essential (primary) hypertension: Secondary | ICD-10-CM

## 2022-12-26 DIAGNOSIS — I480 Paroxysmal atrial fibrillation: Secondary | ICD-10-CM

## 2022-12-26 NOTE — Telephone Encounter (Signed)
Pt cardiologist office was contacted and stated that pt has an Office Visit  today. I followed up on fax that was sent prior and they stated that they do not have the fax that was sent previous. I faxed the request to there office and received conformation page.

## 2022-12-26 NOTE — Progress Notes (Signed)
Primary Physician/Referring:  Chilton Greathouse, MD  Patient ID: William Nixon, male    DOB: [birth year], 61 y.o.   MRN: [id number]  Chief Complaint  Patient presents with   Atrial Fibrillation   Follow-up    6 month   HPI:    William Nixon  is a 62 y.o. [place] male patient with diabetes mellitus, hyperlipidemia, presents for follow-up of paroxysmal atrial fibrillation, underwent atrial fibrillation ablation on 03/31/2022 by Dr. Elberta Fortis.    This is a 75-month office visit, remains asymptomatic.     Past Medical History:  Diagnosis Date   Diabetes mellitus    type II   Hyperlipidemia    pt. states that hdl ldl was elevated denies hyperlipidemia   Hypertension    Hyperthyroidism    pt. denies having any thyroid issues   Paroxysmal atrial fibrillation (HCC)    Personal history of colonic polyps - adenomas 08/06/2013   07/2013 - 3 diminutive adenomas - repeat colonoscopy 2018   Right hydrocele    S/P ACL repair    right    Past Surgical History:  Procedure Laterality Date   ATRIAL FIBRILLATION ABLATION N/A 03/31/2022   Procedure: ATRIAL FIBRILLATION ABLATION;  Surgeon: Regan Lemming, MD;  Location: [facility];  Service: Cardiovascular;  Laterality: N/A;   COLONOSCOPY     KNEE SURGERY  2002   right   Family History  Problem Relation Age of Onset   Diabetes Mother    Diabetes Father    Diabetes Sister    Diabetes Brother    Alcohol abuse Other    Diabetes Other    Hypertension Other     Social History   Tobacco Use   Smoking status: Never   Smokeless tobacco: Never  Substance Use Topics   Alcohol use: Yes    Alcohol/week: 2.0 standard drinks of alcohol    Types: 2 Standard drinks or equivalent per week    Comment: occasionally   Marital Status: Married  ROS  Review of Systems  Cardiovascular:  Negative for chest pain, dyspnea on exertion and leg swelling.   Objective  Blood pressure 131/76, pulse 69, resp. rate 16, height 5'  8" (1.727 m), weight 153 lb 12.8 oz (69.8 kg), SpO2 96%. Body mass index is 23.39 kg/m.     12/26/2022    1:24 PM 12/07/2022    8:44 AM 07/01/2022   11:30 AM  Vitals with BMI  Height 5\' 8"  5\' 8"    Weight 153 lbs 13 oz 154 lbs 13 oz   BMI 23.39 23.54   Systolic 131 142 784  Diastolic 76 80 78  Pulse 69 65     Physical Exam Neck:     Vascular: No JVD.  Cardiovascular:     Rate and Rhythm: Normal rate and regular rhythm.     Pulses: Intact distal pulses.     Heart sounds: Normal heart sounds. No murmur heard.    No gallop.  Pulmonary:     Effort: Pulmonary effort is normal.     Breath sounds: Normal breath sounds.  Abdominal:     General: Bowel sounds are normal.     Palpations: Abdomen is soft.  Musculoskeletal:     Right lower leg: No edema.     Left lower leg: No edema.    Medications and allergies   Allergies  Allergen Reactions   Beef-Derived Products     Cultural    Dulaglutide Nausea And Vomiting  Medication list after today's encounter   Current Outpatient Medications:    apixaban (ELIQUIS) 5 MG TABS tablet, Take 1 tablet (5 mg total) by mouth 2 (two) times daily., Disp: 60 tablet, Rfl: 6   Ascorbic Acid (VITAMIN C PO), Take 1 tablet by mouth daily., Disp: , Rfl:    Boswellia-Glucosamine-Vit D (OSTEO BI-FLEX ONE PER DAY PO), Take 1 tablet by mouth daily., Disp: , Rfl:    glipiZIDE (GLUCOTROL XL) 5 MG 24 hr tablet, Take 2 tablets (10 mg total) by mouth daily with breakfast., Disp: 180 tablet, Rfl: 3   JARDIANCE 25 MG TABS tablet, TAKE 1 TABLET BY MOUTH DAILY BEFORE BREAKFAST, Disp: 30 tablet, Rfl: 3   metFORMIN (GLUCOPHAGE) 1000 MG tablet, TAKE ONE TABLET BY MOUTH TWICE A DAY, Disp: 180 tablet, Rfl: 3   Multiple Vitamin (MULTIVITAMIN WITH MINERALS) TABS, Take 1 tablet by mouth daily. Centrum, Disp: , Rfl:    olmesartan (BENICAR) 20 MG tablet, Take 1 tablet (20 mg total) by mouth every evening., Disp: 90 tablet, Rfl: 1   OneTouch Delica Lancets 33G MISC, Use to  check blood sugar 3 times a day., Disp: 300 each, Rfl: 12   ONETOUCH VERIO test strip, USE 1 STRIP TO TEST 3 TIMES A DAY, Disp: 50 strip, Rfl: 3   rosuvastatin (CRESTOR) 10 MG tablet, Take 10 mg by mouth at bedtime., Disp: , Rfl:    TRADJENTA 5 MG TABS tablet, Take 1 tablet (5 mg total) by mouth daily., Disp: 90 tablet, Rfl: 3   glipiZIDE (GLUCOTROL) 5 MG tablet, Take 5 mg by mouth daily before breakfast., Disp: , Rfl:   Laboratory examination:   Recent Labs    03/22/22 1348 07/08/22 1306  NA 139 137  K 3.9 4.3  CL 101 99  CO2 27 24  GLUCOSE 156* 174*  BUN 14 15  CREATININE 0.76 0.71*  CALCIUM 10.0 9.6      Latest Ref Rng & Units 07/08/2022    1:06 PM 03/22/2022    1:48 PM 07/15/2021    8:49 AM  CMP  Glucose 70 - 99 mg/dL 829  562  130   BUN 8 - 27 mg/dL 15  14  14    Creatinine 0.76 - 1.27 mg/dL 8.65  7.84  6.96   Sodium 134 - 144 mmol/L 137  139  137   Potassium 3.5 - 5.2 mmol/L 4.3  3.9  3.6   Chloride 96 - 106 mmol/L 99  101  105   CO2 20 - 29 mmol/L 24  27  22    Calcium 8.6 - 10.2 mg/dL 9.6  29.5  8.7       Latest Ref Rng & Units 03/22/2022    1:48 PM 07/15/2021    8:49 AM 01/16/2018   10:08 AM  CBC  WBC 3.4 - 10.8 x10E3/uL 6.7  10.3  3.6   Hemoglobin 13.0 - 17.7 g/dL 28.4  13.2  44.0   Hematocrit 37.5 - 51.0 % 43.9  42.9  44.2   Platelets 150 - 450 x10E3/uL 176  279  136.0     HEMOGLOBIN A1C Lab Results  Component Value Date   HGBA1C 7.7 (A) 12/07/2022   Lipoprotein (a) <75.0 nmol/L 12/07/2022 <8.4   External labs:   Cholesterol, total 98.000 mg 02/03/2022 HDL 16.000 mg 02/03/2022 LDL 45.000 mg 02/03/2022 Triglycerides 184.000 m 02/03/2022  A1C 6.900 % 02/03/2022  TSH 3.500 01/21/2021  Radiology:    Cardiac Studies:   PCV MYOCARDIAL PERFUSION WO LEXISCAN  03/20/2019  No previous exam available for comparison. Lexiscan nuclear stress test performed using 1-day protocol. Myocardial perfusion imaging is normal. Left ventricular ejection fraction is   60% with normal wall motion. Low risk study.  PCV ECHOCARDIOGRAM COMPLETE 07/22/2021  Normal LV systolic function with visual EF 60-65%. Left ventricle cavity is normal in size. Mild concentric hypertrophy of the left ventricle. Normal global wall motion. Unable to evaluate diastolic function due to underlying rhythm; but filling pattern suggestive of normal LAP. No significant valvular heart disease. Compared to study 04/09/2019 no significant change.  Mobile cardiac telemetry 13 days 07/22/2021 - 08/05/2021: Dominant rhythm: Sinus. HR 49-145 bpm. Avg HR 78 bpm, in sinus rhythm. Atrial fibrillation occurred (28% burden), ventricular rate 54-188 bpm, avg 100 bpm. 1 episode of atrial tachcyardia at 143 bpm for 12 beats. <1% isolated SVE, couplet/triplets. 0 episodes of VT. <1% isolated VE, couplets. No atrial fibrillationVT/high grade AV block, sinus pause >3sec noted. 0 patient triggered events.   CT cardiac structure 04/06/2022: CAC score of 1469 which is 98th percentile for age-, race-, and sex-matched controls  Atrial fibrillation ablation 03/31/2022: 1. Comprehensive electrophysiologic study. 2. Coronary sinus pacing and recording. 3. Three-dimensional mapping of atrial fibrillation with additional mapping and ablation of a second discrete focus 4. Ablation of atrial fibrillation with additional mapping and ablation of a second discrete focus  EKG:   EKG 12/26/2022: Normal sinus rhythm at the rate of 72 bpm, normal axis, incomplete right bundle branch block.  LVH with repolarization abnormality.  Compared to 06/16/2022, T wave inversion in V4-V6 new.  Assessment     ICD-10-CM   1. Paroxysmal atrial fibrillation (HCC)  I48.0 EKG 12-Lead    2. Primary hypertension  I10       CHA2DS2-VASc Score is 2.  Yearly risk of stroke: 2.3% (HTN, DM).  Score of 1=0.6; 2=2.2; 3=3.2; 4=4.8; 5=7.2; 6=9.8; 7=>9.8) -(CHF; HTN; vasc disease DM,  Male = 1; Age <65 =0; 65-74 = 1,  >75 =2;  stroke/embolism= 2).    Medications Discontinued During This Encounter  Medication Reason   metoprolol tartrate (LOPRESSOR) 50 MG tablet Change in therapy     No orders of the defined types were placed in this encounter.  Orders Placed This Encounter  Procedures   EKG 12-Lead   Recommendations:   William Nixon is a 62 y.o.  [place] male patient with diabetes mellitus, hyperlipidemia, presents for follow-up of paroxysmal atrial fibrillation, underwent atrial fibrillation ablation on 03/31/2022 by Dr. Elberta Fortis.   1. Paroxysmal atrial fibrillation (HCC) On his last office visit he wanted to come off of beta-blocker therapy as it affected his running, he is an avid runner.  I have weaned him off of the beta-blocker, he is now on olmesartan with excellent control of blood pressure and renal function has remained stable.  Continue the same.  ARB are also indicated to reduce recurrence of atrial fibrillation.  - EKG 12-Lead  2. Primary hypertension Blood pressure is well-controlled, since being a diabetic, ARB therapy would be appropriate.  Patient had developed significant issues with Jardiance with development of UTI and also rash in his groin area, he is now back on Jardiance, instead would recommend trying [place] although they are similar in class.  However I will leave this to his endocrinologist.  He does have severe obstructive sleep apnea and he is compliant with CPAP.    William Decamp, MD, [facility] 12/26/2022, 2:44 PM Office: [phone]

## 2022-12-26 NOTE — Telephone Encounter (Signed)
[facility] contatced to follow up on clearance.  Pt has appointment today at their office.  Their office stated that they was unaware of the fax that was previously sent over.  Request for clearance was faxed and conformation page received.

## 2022-12-27 NOTE — Telephone Encounter (Signed)
William Nixon,   I see he had his apt yesterday with no mention of holding his blood thinner before his procedure can we please follow up on this the patients PV is tomorrow.   Thank you,   Previsit

## 2022-12-28 NOTE — Telephone Encounter (Signed)
[facility] contatced to follow up on clearance.  Office stated that they have received the fax that we sent requested for clearance and it has been sent to the Dr. To fill out.

## 2022-12-30 ENCOUNTER — Ambulatory Visit (AMBULATORY_SURGERY_CENTER): Payer: 59 | Admitting: *Deleted

## 2022-12-30 VITALS — Ht 68.0 in | Wt 150.8 lb

## 2022-12-30 DIAGNOSIS — Z86010 Personal history of colonic polyps: Secondary | ICD-10-CM

## 2022-12-30 MED ORDER — NA SULFATE-K SULFATE-MG SULF 17.5-3.13-1.6 GM/177ML PO SOLN
1.0000 | Freq: Once | ORAL | 0 refills | Status: AC
Start: 2022-12-30 — End: 2022-12-30

## 2022-12-30 NOTE — Progress Notes (Signed)
No egg or soy allergy known to patient  No issues known to pt with past sedation with any surgeries or procedures Patient denies ever being told they had issues or difficulty with intubation  No FH of Malignant Hyperthermia Pt is not on diet pills Pt is not on  home 02  Pt is  on blood thinners  Pt denies issues with constipation  H/O A fib or A flutter Have any cardiac testing pending--NO Pt instructed to use [email or web address] or GoodRx for a price reduction on prep

## 2023-01-03 ENCOUNTER — Ambulatory Visit: Payer: 59 | Admitting: Cardiology

## 2023-01-03 NOTE — Telephone Encounter (Signed)
Clearance received. Please see other phone note.

## 2023-01-03 NOTE — Telephone Encounter (Signed)
Pt made aware or recommendations from Dr. Jacinto Halim.  Pt verbalized understanding with all questions answered.

## 2023-01-04 ENCOUNTER — Ambulatory Visit: Payer: 59 | Admitting: Cardiology

## 2023-01-06 ENCOUNTER — Encounter: Payer: Self-pay | Admitting: Internal Medicine

## 2023-01-08 ENCOUNTER — Other Ambulatory Visit: Payer: Self-pay | Admitting: Internal Medicine

## 2023-01-16 ENCOUNTER — Other Ambulatory Visit: Payer: Self-pay | Admitting: *Deleted

## 2023-01-16 DIAGNOSIS — I480 Paroxysmal atrial fibrillation: Secondary | ICD-10-CM

## 2023-01-16 MED ORDER — APIXABAN 5 MG PO TABS
5.0000 mg | ORAL_TABLET | Freq: Two times a day (BID) | ORAL | 5 refills | Status: DC
Start: 2023-01-16 — End: 2023-06-05

## 2023-01-16 NOTE — Telephone Encounter (Signed)
Eliquis 5mg  refill request received. Patient is 62 years old, weight-68.4kg, Crea-0.71 on 07/08/22, Diagnosis-Afib, and last seen by Dr. Jacinto Halim on 12/26/22. Dose is appropriate based on dosing criteria. Will send in refill to requested pharmacy.

## 2023-01-18 ENCOUNTER — Ambulatory Visit: Payer: 59 | Admitting: Internal Medicine

## 2023-01-18 ENCOUNTER — Encounter: Payer: Self-pay | Admitting: Internal Medicine

## 2023-01-18 VITALS — BP 98/62 | HR 90 | Temp 98.7°F | Resp 23 | Ht 68.0 in | Wt 150.8 lb

## 2023-01-18 DIAGNOSIS — D125 Benign neoplasm of sigmoid colon: Secondary | ICD-10-CM | POA: Diagnosis not present

## 2023-01-18 DIAGNOSIS — Z860100 Personal history of colon polyps, unspecified: Secondary | ICD-10-CM

## 2023-01-18 DIAGNOSIS — Z09 Encounter for follow-up examination after completed treatment for conditions other than malignant neoplasm: Secondary | ICD-10-CM | POA: Diagnosis present

## 2023-01-18 DIAGNOSIS — Z860101 Personal history of adenomatous and serrated colon polyps: Secondary | ICD-10-CM | POA: Diagnosis not present

## 2023-01-18 MED ORDER — SODIUM CHLORIDE 0.9 % IV SOLN: 500.0000 mL | INTRAVENOUS | Status: DC

## 2023-01-18 NOTE — Progress Notes (Signed)
Pt's states no medical or surgical changes since previsit or office visit. 

## 2023-01-18 NOTE — Progress Notes (Signed)
Called to room to assist during endoscopic procedure.  Patient ID and intended procedure confirmed with present staff. Received instructions for my participation in the procedure from the performing physician.  

## 2023-01-18 NOTE — Op Note (Signed)
[facility] Patient Name: William Nixon Procedure Date: 01/18/2023 8:52 AM MRN: [id number] Endoscopist: Iva Boop , MD, [id number] Age: 62 Referring MD:  Date of Birth: March 01, 1961 Gender: Male Account #: [id number] Procedure:                Colonoscopy Indications:              Surveillance: Personal history of adenomatous                            polyps on last colonoscopy > 5 years ago, Last                            colonoscopy: 2018 Medicines:                Monitored Anesthesia Care Procedure:                Pre-Anesthesia Assessment:                           - Prior to the procedure, a History and Physical                            was performed, and patient medications and                            allergies were reviewed. The patient's tolerance of                            previous anesthesia was also reviewed. The risks                            and benefits of the procedure and the sedation                            options and risks were discussed with the patient.                            All questions were answered, and informed consent                            was obtained. Prior Anticoagulants: The patient                            last took Eliquis (apixaban) 2 days prior to the                            procedure. ASA Grade Assessment: II - A patient                            with mild systemic disease. After reviewing the                            risks and benefits, the patient was deemed in  satisfactory condition to undergo the procedure.                           After obtaining informed consent, the colonoscope                            was passed under direct vision. Throughout the                            procedure, the patient's blood pressure, pulse, and                            oxygen saturations were monitored continuously. The                            Olympus CF-HQ190L ([id number])  Colonoscope was                            introduced through the anus and advanced to the the                            cecum, identified by appendiceal orifice and                            ileocecal valve. The colonoscopy was performed                            without difficulty. The patient tolerated the                            procedure well. The quality of the bowel                            preparation was good. The ileocecal valve,                            appendiceal orifice, and rectum were photographed.                            The bowel preparation used was SUPREP via split                            dose instruction. Scope In: 9:15:00 AM Scope Out: 9:30:33 AM Scope Withdrawal Time: 0 hours 11 minutes 42 seconds  Total Procedure Duration: 0 hours 15 minutes 33 seconds  Findings:                 The perianal and digital rectal examinations were                            normal.                           A diminutive polyp was found in the sigmoid colon.  The polyp was flat. The polyp was removed with a                            cold snare. Resection and retrieval were complete.                            Verification of patient identification for the                            specimen was done. Estimated blood loss was minimal.                           The exam was otherwise without abnormality on                            direct and retroflexion views. Complications:            No immediate complications. Estimated Blood Loss:     Estimated blood loss was minimal. Impression:               - One diminutive polyp in the sigmoid colon,                            removed with a cold snare. Resected and retrieved.                           - The examination was otherwise normal on direct                            and retroflexion views.                           - Personal history of colonic polyps. April 2015 3                             adenomas, August 2018 1 adenoma all diminutive Recommendation:           - Patient has a contact number available for                            emergencies. The signs and symptoms of potential                            delayed complications were discussed with the                            patient. Return to normal activities tomorrow.                            Written discharge instructions were provided to the                            patient.                           -  Resume previous diet.                           - Continue present medications.                           - Resume Eliquis (apixaban) at prior dose tomorrow.                           - Repeat colonoscopy is recommended for                            surveillance. The colonoscopy date will be                            determined after pathology results from today's                            exam become available for review. Iva Boop, MD 01/18/2023 9:38:30 AM This report has been signed electronically.

## 2023-01-18 NOTE — Progress Notes (Signed)
[facility] History and Physical   Primary Care Physician:  Chilton Greathouse, MD   Reason for Procedure:    Encounter Diagnosis  Name Primary?   History of colonic polyps Yes     Plan:    colonoscopy     HPI: William Nixon is a 62 y.o. male presenting for polyp surveillance exam  Eliquis held   April 2015 3 adenomas, August 2018 1 adenoma all diminutive   Past Medical History:  Diagnosis Date   Diabetes mellitus    type II   Hyperlipidemia    pt. states that hdl ldl was elevated denies hyperlipidemia   Hypertension    Hyperthyroidism    pt. denies having any thyroid issues   Paroxysmal atrial fibrillation (HCC)    Personal history of colonic polyps - adenomas 08/06/2013   07/2013 - 3 diminutive adenomas - repeat colonoscopy 2018   Right hydrocele    S/P ACL repair    right    Sleep apnea    C PAP    Past Surgical History:  Procedure Laterality Date   ATRIAL FIBRILLATION ABLATION N/A 03/31/2022   Procedure: ATRIAL FIBRILLATION ABLATION;  Surgeon: Regan Lemming, MD;  Location: [facility];  Service: Cardiovascular;  Laterality: N/A;   COLONOSCOPY     KNEE SURGERY  2002   right    Prior to Admission medications   Medication Sig Start Date End Date Taking? Authorizing Provider  apixaban (ELIQUIS) 5 MG TABS tablet Take 1 tablet (5 mg total) by mouth 2 (two) times daily. 01/16/23  Yes Yates Decamp, MD  Ascorbic Acid (VITAMIN C PO) Take 1 tablet by mouth daily.   Yes [provider]  Boswellia-Glucosamine-Vit D (OSTEO BI-FLEX ONE PER DAY PO) Take 1 tablet by mouth daily.   Yes [provider]  glipiZIDE (GLUCOTROL XL) 5 MG 24 hr tablet Take 2 tablets (10 mg total) by mouth daily with breakfast. 11/08/22  Yes Carlus Pavlov, MD  glipiZIDE (GLUCOTROL) 5 MG tablet Take 5 mg by mouth daily before breakfast.   Yes [provider]  JARDIANCE 25 MG TABS tablet TAKE 1 TABLET BY MOUTH DAILY BEFORE BREAKFAST 11/28/22   Yes Carlus Pavlov, MD  metFORMIN (GLUCOPHAGE) 1000 MG tablet TAKE ONE TABLET BY MOUTH TWICE A DAY 04/07/22  Yes Carlus Pavlov, MD  Multiple Vitamin (MULTIVITAMIN WITH MINERALS) TABS Take 1 tablet by mouth daily. Centrum   Yes [provider]  olmesartan (BENICAR) 20 MG tablet Take 1 tablet (20 mg total) by mouth every evening. 06/16/22  Yes Yates Decamp, MD  OneTouch Delica Lancets 33G MISC Use to check blood sugar 3 times a day. 07/05/18  Yes Carlus Pavlov, MD  [facility] VERIO test strip USE 1 STRIP TO TEST THREE TIMES A DAY 01/09/23  Yes Carlus Pavlov, MD  rosuvastatin (CRESTOR) 10 MG tablet Take 10 mg by mouth at bedtime. 01/23/19  Yes [provider]  TRADJENTA 5 MG TABS tablet Take 1 tablet (5 mg total) by mouth daily. 08/10/22  Yes Carlus Pavlov, MD    Current Outpatient Medications  Medication Sig Dispense Refill   apixaban (ELIQUIS) 5 MG TABS tablet Take 1 tablet (5 mg total) by mouth 2 (two) times daily. 60 tablet 5   Ascorbic Acid (VITAMIN C PO) Take 1 tablet by mouth daily.     Boswellia-Glucosamine-Vit D (OSTEO BI-FLEX ONE PER DAY PO) Take 1 tablet by mouth daily.     glipiZIDE (GLUCOTROL XL) 5 MG 24 hr tablet  Take 2 tablets (10 mg total) by mouth daily with breakfast. 180 tablet 3   glipiZIDE (GLUCOTROL) 5 MG tablet Take 5 mg by mouth daily before breakfast.     JARDIANCE 25 MG TABS tablet TAKE 1 TABLET BY MOUTH DAILY BEFORE BREAKFAST 30 tablet 3   metFORMIN (GLUCOPHAGE) 1000 MG tablet TAKE ONE TABLET BY MOUTH TWICE A DAY 180 tablet 3   Multiple Vitamin (MULTIVITAMIN WITH MINERALS) TABS Take 1 tablet by mouth daily. Centrum     olmesartan (BENICAR) 20 MG tablet Take 1 tablet (20 mg total) by mouth every evening. 90 tablet 1   OneTouch Delica Lancets 33G MISC Use to check blood sugar 3 times a day. 300 each 12   ONETOUCH VERIO test strip USE 1 STRIP TO TEST THREE TIMES A DAY 50 strip 3   rosuvastatin (CRESTOR) 10 MG tablet Take 10 mg by mouth at  bedtime.     TRADJENTA 5 MG TABS tablet Take 1 tablet (5 mg total) by mouth daily. 90 tablet 3   Current Facility-Administered Medications  Medication Dose Route Frequency Provider Last Rate Last Admin   0.9 %  sodium chloride infusion  500 mL Intravenous Continuous Iva Boop, MD        Allergies as of 01/18/2023 - Review Complete 01/18/2023  Allergen Reaction Noted   Beef-derived products  03/31/2022   Dulaglutide Nausea And Vomiting 11/17/2019    Family History  Problem Relation Age of Onset   Diabetes Mother    Diabetes Father    Diabetes Sister    Diabetes Brother    Alcohol abuse Other    Diabetes Other    Hypertension Other    Colon cancer Neg Hx    Colon polyps Neg Hx    Crohn's disease Neg Hx    Esophageal cancer Neg Hx    Rectal cancer Neg Hx    Stomach cancer Neg Hx    Ulcerative colitis Neg Hx     Social History   Socioeconomic History   Marital status: Married    Spouse name: Amritha   Number of children: 1   Years of education: Not on file   Highest education level: Not on file  Occupational History   Occupation: Full time  Tobacco Use   Smoking status: Never   Smokeless tobacco: Never  Vaping Use   Vaping status: Never Used  Substance and Sexual Activity   Alcohol use: Yes    Alcohol/week: 2.0 standard drinks of alcohol    Types: 2 Standard drinks or equivalent per week    Comment: occasionally   Drug use: No   Sexual activity: Not on file  Other Topics Concern   Not on file  Social History Narrative   Married with 1 child   Never tobacco no drug use occasional alcohol approximately 2 drinks per week   Social Determinants of Health   Financial Resource Strain: Not on file  Food Insecurity: Not on file  Transportation Needs: Not on file  Physical Activity: Not on file  Stress: Not on file  Social Connections: Not on file  Intimate Partner Violence: Not on file    Review of Systems:  All other review of systems negative except  as mentioned in the HPI.  Physical Exam: Vital signs BP (!) 142/78   Pulse 70   Temp 98.7 F (37.1 C)   Ht 5\' 8"  (1.727 m)   Wt 150 lb 12.8 oz (68.4 kg)   SpO2 97%  BMI 22.93 kg/m   General:   Alert,  Well-developed, well-nourished, pleasant and cooperative in NAD Lungs:  Clear throughout to auscultation.   Heart:  Regular rate and rhythm; no murmurs, clicks, rubs,  or gallops. Abdomen:  Soft, nontender and nondistended. Normal bowel sounds.   Neuro/Psych:  Alert and cooperative. Normal mood and affect. A and O x 3   @Chayil Gantt  Sena Slate, MD, [facility] [phone] (pager) 01/18/2023 9:04 AM@

## 2023-01-18 NOTE — Patient Instructions (Addendum)
I found and removed another tiny polyp 91).  I will let you know pathology results and when to have another routine colonoscopy by mail and/or My Chart.  I appreciate the opportunity to care for you. Iva Boop, MD, [facility]  Handout provided on polyps.  Resume previous diet.  Continue present medications.  Resume Eliquis (apixaban) at prior dose tomorrow.  Repeat colonoscopy is recommended for surveillance. The colonoscopy date will be determined after pathology result from today's exam become available for review.   YOU HAD AN ENDOSCOPIC PROCEDURE TODAY AT THE [facility]:   Refer to the procedure report that was given to you for any specific questions about what was found during the examination.  If the procedure report does not answer your questions, please call your gastroenterologist to clarify.  If you requested that your care partner not be given the details of your procedure findings, then the procedure report has been included in a sealed envelope for you to review at your convenience later.  YOU SHOULD EXPECT: Some feelings of bloating in the abdomen. Passage of more gas than usual.  Walking can help get rid of the air that was put into your GI tract during the procedure and reduce the bloating. If you had a lower endoscopy (such as a colonoscopy or flexible sigmoidoscopy) you may notice spotting of blood in your stool or on the toilet paper. If you underwent a bowel prep for your procedure, you may not have a normal bowel movement for a few days.  Please Note:  You might notice some irritation and congestion in your nose or some drainage.  This is from the oxygen used during your procedure.  There is no need for concern and it should clear up in a day or so.  SYMPTOMS TO REPORT IMMEDIATELY:  Following lower endoscopy (colonoscopy or flexible sigmoidoscopy):  Excessive amounts of blood in the stool  Significant tenderness or worsening of abdominal pains  Swelling of  the abdomen that is new, acute  Fever of 100F or higher  For urgent or emergent issues, a gastroenterologist can be reached at any hour by calling [phone]. Do not use MyChart messaging for urgent concerns.    DIET:  We do recommend a small meal at first, but then you may proceed to your regular diet.  Drink plenty of fluids but you should avoid alcoholic beverages for 24 hours.  ACTIVITY:  You should plan to take it easy for the rest of today and you should NOT DRIVE or use heavy machinery until tomorrow (because of the sedation medicines used during the test).    FOLLOW UP: Our staff will call the number listed on your records the next business day following your procedure.  We will call around 7:15- 8:00 am to check on you and address any questions or concerns that you may have regarding the information given to you following your procedure. If we do not reach you, we will leave a message.     If any biopsies were taken you will be contacted by phone or by letter within the next 1-3 weeks.  Please call us at [phone] if you have not heard about the biopsies in 3 weeks.    SIGNATURES/CONFIDENTIALITY: You and/or your care partner have signed paperwork which will be entered into your electronic medical record.  These signatures attest to the fact that that the information above on your After Visit Summary has been reviewed and is understood.  Full responsibility  of the confidentiality of this discharge information lies with you and/or your care-partner.

## 2023-01-19 ENCOUNTER — Encounter: Payer: Self-pay | Admitting: Internal Medicine

## 2023-01-19 ENCOUNTER — Telehealth: Payer: Self-pay | Admitting: *Deleted

## 2023-01-19 NOTE — Telephone Encounter (Signed)
  Follow up Call-     01/18/2023    8:13 AM  Call back number  Post procedure Call Back phone  # [phone]  Permission to leave phone message Yes     Patient questions:  Do you have a fever, pain , or abdominal swelling? No. Pain Score  0 *  Have you tolerated food without any problems? Yes.    Have you been able to return to your normal activities? Yes.    Do you have any questions about your discharge instructions: Diet   No. Medications  No. Follow up visit  No.  Do you have questions or concerns about your Care? No.  Actions: * If pain score is 4 or above: No action needed, pain <4.

## 2023-01-20 ENCOUNTER — Other Ambulatory Visit: Payer: Self-pay | Admitting: Internal Medicine

## 2023-01-20 LAB — SURGICAL PATHOLOGY

## 2023-01-20 MED ORDER — EMPAGLIFLOZIN 25 MG PO TABS: 25.0000 mg | ORAL_TABLET | Freq: Every day | ORAL | 3 refills | Status: DC

## 2023-01-23 ENCOUNTER — Encounter: Payer: Self-pay | Admitting: Internal Medicine

## 2023-02-08 ENCOUNTER — Encounter: Payer: Self-pay | Admitting: Cardiology

## 2023-02-08 ENCOUNTER — Ambulatory Visit: Payer: 59 | Attending: Cardiology | Admitting: Cardiology

## 2023-02-08 VITALS — BP 160/94 | HR 62 | Ht 68.0 in | Wt 152.8 lb

## 2023-02-08 DIAGNOSIS — I10 Essential (primary) hypertension: Secondary | ICD-10-CM

## 2023-02-08 DIAGNOSIS — D6869 Other thrombophilia: Secondary | ICD-10-CM | POA: Diagnosis not present

## 2023-02-08 DIAGNOSIS — I480 Paroxysmal atrial fibrillation: Secondary | ICD-10-CM | POA: Diagnosis not present

## 2023-02-08 NOTE — Patient Instructions (Signed)
Medication Instructions:  Your physician recommends that you continue on your current medications as directed. Please refer to the Current Medication list given to you today.  *If you need a refill on your cardiac medications before your next appointment, please call your pharmacy*   Lab Work: None ordered   Testing/Procedures: None ordered   Follow-Up: At [facility], you and your health needs are our priority.  As part of our continuing mission to provide you with exceptional heart care, we have created designated Provider Care Teams.  These Care Teams include your primary Cardiologist (physician) and Advanced Practice Providers (APPs -  Physician Assistants and Nurse Practitioners) who all work together to provide you with the care you need, when you need it.  Your next appointment:   6 month(s)  The format for your next appointment:   In Person  Provider:   You will follow up in the Atrial Fibrillation Clinic located at [facility]. Your provider will be: Clint R. Fenton, PA-C or Lake Bells, PA-C    Thank you for choosing [facility]!!   Dory Horn, RN [phone]

## 2023-02-08 NOTE — Progress Notes (Signed)
  Electrophysiology Office Note:   Date:  02/08/2023  ID:  William Nixon, DOB [birth year], MRN [id number]  Primary Cardiologist: None Electrophysiologist: Shilpa Bushee Jorja Loa, MD      History of Present Illness:   William Nixon is a 62 y.o. male with h/o type 2 diabetes, hypertension, hyperlipidemia, sleep apnea, atrial fibrillation post ablation 03/31/2022 seen today for routine electrophysiology followup.   Since last being seen in our clinic the patient reports doing well.  He has no chest pain or shortness of breath.  He is able to do all of his daily activities.  He has had no further episodes of atrial fibrillation.  Blood pressure is elevated today but he has not yet taken his morning medicines.  he denies chest pain, palpitations, dyspnea, PND, orthopnea, nausea, vomiting, dizziness, syncope, edema, weight gain, or early satiety.   Review of systems complete and found to be negative unless listed in HPI.   EP Information / Studies Reviewed:    EKG is not ordered today. EKG from 12/26/22 reviewed which showed sinus rhythm        Risk Assessment/Calculations:    CHA2DS2-VASc Score = 3   This indicates a 3.2% annual risk of stroke. The patient's score is based upon: CHF History: 0 HTN History: 1 Diabetes History: 1 Stroke History: 0 Vascular Disease History: 1 Age Score: 0 Gender Score: 0            Physical Exam:   VS:  There were no vitals taken for this visit.   Wt Readings from Last 3 Encounters:  01/18/23 150 lb 12.8 oz (68.4 kg)  12/30/22 150 lb 12.8 oz (68.4 kg)  12/26/22 153 lb 12.8 oz (69.8 kg)     GEN: Well nourished, well developed in no acute distress NECK: No JVD; No carotid bruits CARDIAC: Regular rate and rhythm, no murmurs, rubs, gallops RESPIRATORY:  Clear to auscultation without rales, wheezing or rhonchi  ABDOMEN: Soft, non-tender, non-distended EXTREMITIES:  No edema; No deformity   ASSESSMENT AND PLAN:    1.  Paroxysmal atrial  fibrillation: Post ablation 03/31/2022.  He has had no further episodes of atrial fibrillation.  Feeling well.  Richa Shor continue with current management.  2.  Secondary hypercoagulable state: Currently on Eliquis for atrial fibrillation  3.  Hypertension: Today.  He has not yet taken his blood pressure medications.  Well-controlled at home.  No changes.  Follow up with Afib Clinic in 6 months  Signed, Antoinett Dorman Jorja Loa, MD

## 2023-02-14 ENCOUNTER — Encounter: Payer: 59 | Admitting: Internal Medicine

## 2023-03-07 LAB — MICROALBUMIN / CREATININE URINE RATIO: Microalb Creat Ratio: 22

## 2023-03-31 ENCOUNTER — Other Ambulatory Visit: Payer: Self-pay | Admitting: Internal Medicine

## 2023-04-05 ENCOUNTER — Ambulatory Visit: Payer: 59 | Admitting: Internal Medicine

## 2023-04-10 ENCOUNTER — Ambulatory Visit: Payer: 59 | Admitting: Internal Medicine

## 2023-04-17 ENCOUNTER — Other Ambulatory Visit: Payer: Self-pay | Admitting: Internal Medicine

## 2023-04-17 NOTE — Telephone Encounter (Signed)
Test strip  refill request complete

## 2023-05-04 ENCOUNTER — Other Ambulatory Visit: Payer: Self-pay | Admitting: Cardiology

## 2023-05-04 DIAGNOSIS — I10 Essential (primary) hypertension: Secondary | ICD-10-CM

## 2023-05-09 ENCOUNTER — Encounter: Payer: Self-pay | Admitting: Internal Medicine

## 2023-05-10 MED ORDER — GLIPIZIDE 5 MG PO TABS: 5.0000 mg | ORAL_TABLET | Freq: Every day | ORAL | 3 refills | Status: DC

## 2023-05-16 ENCOUNTER — Ambulatory Visit (INDEPENDENT_AMBULATORY_CARE_PROVIDER_SITE_OTHER): Payer: 59 | Admitting: Internal Medicine

## 2023-05-16 ENCOUNTER — Encounter: Payer: Self-pay | Admitting: Internal Medicine

## 2023-05-16 VITALS — BP 120/70 | HR 74 | Ht 68.0 in | Wt 151.8 lb

## 2023-05-16 DIAGNOSIS — R7989 Other specified abnormal findings of blood chemistry: Secondary | ICD-10-CM | POA: Diagnosis not present

## 2023-05-16 DIAGNOSIS — E119 Type 2 diabetes mellitus without complications: Secondary | ICD-10-CM

## 2023-05-16 DIAGNOSIS — E041 Nontoxic single thyroid nodule: Secondary | ICD-10-CM | POA: Diagnosis not present

## 2023-05-16 DIAGNOSIS — E785 Hyperlipidemia, unspecified: Secondary | ICD-10-CM | POA: Diagnosis not present

## 2023-05-16 DIAGNOSIS — Z7984 Long term (current) use of oral hypoglycemic drugs: Secondary | ICD-10-CM | POA: Diagnosis not present

## 2023-05-16 DIAGNOSIS — E1169 Type 2 diabetes mellitus with other specified complication: Secondary | ICD-10-CM

## 2023-05-16 LAB — POCT GLYCOSYLATED HEMOGLOBIN (HGB A1C): Hemoglobin A1C: 7.2 % — AB (ref 4.0–5.6)

## 2023-05-16 MED ORDER — GLIPIZIDE ER 5 MG PO TB24: 10.0000 mg | ORAL_TABLET | Freq: Every day | ORAL | 3 refills | Status: AC

## 2023-05-16 MED ORDER — TRADJENTA 5 MG PO TABS: 5.0000 mg | ORAL_TABLET | Freq: Every day | ORAL | 3 refills | Status: DC

## 2023-05-16 NOTE — Progress Notes (Signed)
Patient ID: William Nixon, male   DOB: 07-27-1960, 63 y.o.   MRN: [id number]  HPI: William Nixon is a 63 y.o.-year-old male, returning for f/u DM2, dx in ~1998, non-insulin-dependent, controlled, without long-term complications. Last visit 5 months ago.  Interim history: Since last visit, he had cardiac ablation for atrial fibrillation 03/31/2022. After his ablation, he resumed exercise: cardio, weights. No increased urination, blurry vision, nausea, chest pain.  DM2: Reviewed latest HbA1c levels: 02/25/2023: HbA1c 7.4% Lab Results  Component Value Date   HGBA1C 7.7 (A) 12/07/2022   HGBA1C 6.7 (A) 06/15/2022   HGBA1C 6.7 (A) 09/07/2021  01/18/2022: HbA1c 6.9% 01/21/2021: HbA1c 7.1% 02/14/2020: HbA1c 6.6% During summer months, as he is more active, his HbA1c is usually lower.  He is on: Medicine  Breakfast Lunch Dinner  Metformin M  M  Glipizide   G -5  Glipizide XL G XL 5 >> 10    Jardiance 25 >> off >> 10 >> 25 J 25    Januvia/Tradjenta  J/T 5     He also tried herbal remedies: - mustard seed daily >> helps a lot - bitter melon once a week.  He tried Rybelsus >> N/V/weight loss. Tried Trulicity >> N/V and Glu in the 200s. He feels that his sugar control deteriorated after stopping glipizide XL before visit from 12/21 so we started this back.  Pt checks his sugars 1-2 times a day: - am:  90, 132-140 (late dinners) >> 109-138, 162 >> 150-165 >> 139-150 - 2h after b'fast: n/c  - before lunch: n/c >> 103-165 >> n/c  - 2h after lunch: 170-180 >> 137, 177, 205 >> 190-210 >> 170-190 - before dinner: 92-114 >> n/c >> 96 - 2h after dinner: 170-180 >> 162-172 >> n/c >> <170 >> n/c  - bedtime: n/c - nighttime: n/c Lowest sugar was 90 >> 90s >> 120 >> 96; he has hypoglycemia awareness in the 80s. Highest sugar was 205 >> 220 >> 190.  Glucometer: AccuChek (?[employer])  His meals are mostly vegetarian. He continues to walk/run.  -No CKD, last BUN/creatinine:  Lab  Results  Component Value Date   BUN 15 07/08/2022   CREATININE 0.71 (L) 07/08/2022  On olmesartan 40 mg daily. ACR was elevated, while off ACE inhibitor/ARB, to allow metoprolol dose to be increased for heart rate control.  However, he had an ablation afterwards and he came off metoprolol.  He restarted ARB.  -No persistent microalbuminuria:  01/18/2022: ACR 40 Lab Results  Component Value Date   MICRALBCREAT 2.1 01/16/2018   MICRALBCREAT 2.5 01/10/2017   MICRALBCREAT 2.1 01/06/2016   MICRALBCREAT 2.3 09/10/2014   MICRALBCREAT 0.0 04/30/2014   MICRALBCREAT 0.6 10/02/2013   MICRALBCREAT 1.3 05/29/2013   MICRALBCREAT 0.8 11/07/2012   MICRALBCREAT 0.7 11/02/2011   MICRALBCREAT 1.2 04/27/2011   -+ HL; last set of lipids:  01/18/2022: 98/184/16/45 01/21/2021: 124/174/24/65 02/11/2020: 130/104/28/81 03/22/2019: 109/114/29/57 Lab Results  Component Value Date   CHOL 163 01/16/2018   HDL 27.00 (L) 01/16/2018   LDLCALC 103 (H) 01/16/2018   LDLDIRECT 96.9 11/07/2012   TRIG 165.0 (H) 01/16/2018   CHOLHDL 6 01/16/2018  On Crestor 10.  - last eye exam was in 05/2022: no DR reportedly (Dr. Fawn Kirk).   -No numbness and tingling in his feet.  Last foot exam 12/07/2022.  Thyroid nodule: -Discovered incidentally, on CT.  Thyroid U/S (08/13/2021): Parenchymal Echotexture: Mildly heterogenous  Isthmus: 4 mm  Right lobe:  4.4 x 1.4 x 1.5 cm  Left lobe: 4.7 x 1.4 x 1.8 cm  _________________________________________________________   Estimated total number of nodules >/= 1 cm: 1  _________________________________________________________   Nodule # 1:  Location: Left; Inferior  Maximum size: 1.3 cm; Other 2 dimensions: 1.1 x 1.0 cm  Composition: mixed cystic and solid (1)  Echogenicity: hypoechoic (2) Given size (<1.4 cm) and appearance, this nodule does NOT meet TI-RADS criteria for biopsy or dedicated follow-up.  _________________________________________________________    No additional thyroid abnormality. No hypervascularity. Negative for adenopathy.   IMPRESSION: 1.3 cm left inferior thyroid TR 3 nodule does not meet criteria for biopsy or follow-up. This correlates with the chest CT finding.   No other significant abnormality by ultrasound.  Pt denies: - feeling nodules in neck - hoarseness - dysphagia - choking  TFTs were reviewed:  01/18/2022: TSH 3.31, free T4 1.6 01/21/2021: TSH 3.5 02/11/2020: TSH 3.44 TSH 3.64 and free T4 1.2, both normal, on 08/12/2019. Lab Results  Component Value Date   TSH 5.19 (H) 01/16/2018   No FH of thyroid cancer. No h/o radiation tx to head or neck. No herbal supplements. No Biotin use. No steroids use.   He had a syncopal episode in 02/2019 with loss of consciousness for 1 to 2 minutes - he saw cardiology (Dr. Derrill Center) and this was considered a vasovagal syncope.  He was then found to have paroxysmal A. Fib 2/2 moderate OSA (now on a CPAP).  He had another syncopal episode 07/15/2021.  He was found to be hypotensive (dehydration + overtreatment with antihypertensive medications) at that time.  After the last episode, we decreased the dose of Jardiance to half a 25 mg tablet >> then back to 25 mg.  He started a new job in 06/2016.  Now working from home.  Much less stress.  ROS: + See HPI  I reviewed pt's medications, allergies, PMH, social hx, family hx, and changes were documented in the history of present illness. Otherwise, unchanged from my initial visit note.  Past Medical History:  Diagnosis Date   Diabetes mellitus    type II   Hyperlipidemia    pt. states that hdl ldl was elevated denies hyperlipidemia   Hypertension    Hyperthyroidism    pt. denies having any thyroid issues   Paroxysmal atrial fibrillation (HCC)    Personal history of colonic polyps - adenomas 08/06/2013   07/2013 - 3 diminutive adenomas - repeat colonoscopy 2018   Right hydrocele    S/P ACL repair    right    Sleep apnea     C PAP   Past Surgical History:  Procedure Laterality Date   ATRIAL FIBRILLATION ABLATION N/A 03/31/2022   Procedure: ATRIAL FIBRILLATION ABLATION;  Surgeon: Regan Lemming, MD;  Location: [facility];  Service: Cardiovascular;  Laterality: N/A;   COLONOSCOPY     KNEE SURGERY  2002   right   History   Social History   Marital Status: Married    Spouse Name: N/A    Number of Children: 1   Occupational History   controller   Social History Main Topics   Smoking status: Never Smoker    Smokeless tobacco: Never Used   Alcohol Use:     1 drink(s) per week   Drug Use: No   Current Outpatient Medications on File Prior to Visit  Medication Sig Dispense Refill   apixaban (ELIQUIS) 5 MG TABS tablet Take 1 tablet (5 mg total) by mouth 2 (two) times daily. 60  tablet 5   Ascorbic Acid (VITAMIN C PO) Take 1 tablet by mouth daily.     Boswellia-Glucosamine-Vit D (OSTEO BI-FLEX ONE PER DAY PO) Take 1 tablet by mouth daily.     empagliflozin (JARDIANCE) 25 MG TABS tablet Take 1 tablet (25 mg total) by mouth daily before breakfast. 90 tablet 3   glipiZIDE (GLUCOTROL XL) 5 MG 24 hr tablet Take 2 tablets (10 mg total) by mouth daily with breakfast. 180 tablet 3   glipiZIDE (GLUCOTROL) 5 MG tablet Take 1 tablet (5 mg total) by mouth daily before breakfast. 30 tablet 3   metFORMIN (GLUCOPHAGE) 1000 MG tablet TAKE 1 TABLET BY MOUTH TWICE A DAY 60 tablet 1   Multiple Vitamin (MULTIVITAMIN WITH MINERALS) TABS Take 1 tablet by mouth daily. Centrum     olmesartan (BENICAR) 20 MG tablet TAKE ONE TABLET BY MOUTH EVERY EVENING 90 tablet 2   OneTouch Delica Lancets 33G MISC Use to check blood sugar 3 times a day. 300 each 12   ONETOUCH VERIO test strip USE TO TEST THREE TIMES A DAY 50 strip 3   rosuvastatin (CRESTOR) 10 MG tablet Take 10 mg by mouth at bedtime.     TRADJENTA 5 MG TABS tablet Take 1 tablet (5 mg total) by mouth daily. 90 tablet 3   No current facility-administered  medications on file prior to visit.   Allergies  Allergen Reactions   Beef-Derived Drug Products     Cultural    Dulaglutide Nausea And Vomiting   Family History  Problem Relation Age of Onset   Diabetes Mother    Diabetes Father    Diabetes Sister    Diabetes Brother    Alcohol abuse Other    Diabetes Other    Hypertension Other    Colon cancer Neg Hx    Colon polyps Neg Hx    Crohn's disease Neg Hx    Esophageal cancer Neg Hx    Rectal cancer Neg Hx    Stomach cancer Neg Hx    Ulcerative colitis Neg Hx    PE: BP 120/70   Pulse 74   Ht 5\' 8"  (1.727 m)   Wt 151 lb 12.8 oz (68.9 kg)   SpO2 99%   BMI 23.08 kg/m   Wt Readings from Last 3 Encounters:  05/16/23 151 lb 12.8 oz (68.9 kg)  02/08/23 152 lb 12.8 oz (69.3 kg)  01/18/23 150 lb 12.8 oz (68.4 kg)   Constitutional: normal weight, in NAD Eyes:  EOMI, no exophthalmos ENT: no neck masses, no cervical lymphadenopathy Cardiovascular: RRR, No MRG Respiratory: CTA B Musculoskeletal: no deformities Skin:no rashes Neurological: no tremor with outstretched hands  ASSESSMENT: 1. DM2, non-insulin-dependent, controlled, without complications - also FH of mild DM on mother's side  2. HL  3.  Left thyroid nodule  4.  Elevated TSH  PLAN:  1. Patient with longstanding, fairly well-controlled, type 2 diabetes, on oral antidiabetic regimen with metformin, sulfonylurea, SGLT2 inhibitor and DPP 4 inhibitor, but off Jardiance previously due to UTI and rash with persisted despite Diflucan.  However, this resolved after using antifungal cream and Zeasorb along with 1 week of Diflucan.  At last visit, we added back a low-dose Jardiance.  HbA1c at that time was higher, at 7.7%.  He had another HbA1c 2.5 months ago and this was lower, at 7.4%. -At today's visit, sugars appear to be improved at all times of the day, but they are still slightly above target especially in the morning.  We do not have a lot of options for his diabetes  and the neck step would probably be insulin, but we did discuss about possibly moving the entire metformin dose at night to hopefully help with the high blood sugars after dinner.  Dinner for him is late which is most likely influencing the morning sugars.  I did advise him to let me know if he is not able to tolerate the higher dose of metformin at night, to switch to the extended release formulation.  Otherwise, for now I advised him to continue the same regimen and check blood sugars at different times of the day.  We discussed about the CGM.  This was not covered for him in the past, per his recall. - I suggested to:  Patient Instructions  Please use the following regimen: Medicine  Breakfast Lunch Dinner  Metformin 1000    M - 2000  Glipizide 5     G -5  Glipizide XL 10 G XL -10      Jardiance 25 J - 25      Tradjenta 5 T - 5        Please return in 3 months with your sugar log.    - we checked his HbA1c: 7.2% (lower) - advised to check sugars at different times of the day - 1-2x a day, rotating check times - advised for yearly eye exams >> he is UTD - return to clinic in 3 months  2. HL -Reviewed latest lipid panel from 01/18/2022: LDL at goal, HDL low, triglycerides slightly high:  -He continues on Crestor 10 mg daily without side effects  3.  Left thyroid nodule -This was incidentally seen on CT scan and further evaluated with thyroid ultrasound.  The nodule appears to be situated in the left inferior gland, and it is solid/cystic, hypoechoic.  It measures 1.3 x 1.1 x 1 cm. -Since the also does not have any high risk features, we are following it clinically -He has no neck compression symptoms or masses felt on palpation of her neck today  4.  Elevated TSH -Latest TSH was slightly high while free T4 was normal:  -At today's visit with recheck his TFTs along with TPO and ATA antibodies to screen for Hashimoto's thyroiditis -We did discuss that if the TSH continues to trend up,  we may need levothyroxine.  I explained how to take this.  Increasing need to start.  Orders Placed This Encounter  Procedures   Microalbumin / creatinine urine ratio   TSH   T4, free   T3, free   Thyroglobulin antibody   Thyroid peroxidase antibody   POCT glycosylated hemoglobin (Hb A1C)   Carlus Pavlov, MD PhD [facility]

## 2023-05-16 NOTE — Patient Instructions (Addendum)
Please use the following regimen: Medicine  Breakfast Lunch Dinner  Metformin 1000    M - 2000  Glipizide 5     G -5  Glipizide XL 10 G XL -10      Jardiance 25 J - 25      Tradjenta 5 T - 5        Please return in 3 months with your sugar log.

## 2023-05-16 NOTE — Addendum Note (Signed)
Addended by: Pollie Meyer on: 05/16/2023 11:33 AM   Modules accepted: Orders

## 2023-05-17 ENCOUNTER — Encounter: Payer: Self-pay | Admitting: Internal Medicine

## 2023-05-17 LAB — THYROGLOBULIN ANTIBODY: Thyroglobulin Ab: <1 [IU]/mL (ref <=1)

## 2023-05-17 LAB — THYROID PEROXIDASE ANTIBODY: Thyroperoxidase Ab SerPl-aCnc: <1 [IU]/mL (ref <9)

## 2023-05-17 LAB — T3, FREE: T3, Free: 3.4 pg/mL (ref 2.3–4.2)

## 2023-05-17 LAB — TSH: TSH: 3.95 m[IU]/L (ref 0.40–4.50)

## 2023-05-17 LAB — T4, FREE: Free T4: 1.3 ng/dL (ref 0.8–1.8)

## 2023-05-31 ENCOUNTER — Other Ambulatory Visit: Payer: Self-pay | Admitting: Internal Medicine

## 2023-06-05 ENCOUNTER — Other Ambulatory Visit: Payer: Self-pay | Admitting: *Deleted

## 2023-06-05 DIAGNOSIS — I480 Paroxysmal atrial fibrillation: Secondary | ICD-10-CM

## 2023-06-05 MED ORDER — APIXABAN 5 MG PO TABS
5.0000 mg | ORAL_TABLET | Freq: Two times a day (BID) | ORAL | 5 refills | Status: DC
Start: 2023-06-05 — End: 2023-12-11

## 2023-06-05 NOTE — Telephone Encounter (Signed)
 Eliquis 5mg  refill request received. Patient is 63 years old, weight-68.9kg, Crea-0.71 on 07/08/22, Diagnosis-Afib, and last seen by Dr. Elberta Fortis on 02/08/23. Dose is appropriate based on dosing criteria. Will send in refill to requested pharmacy.

## 2023-08-09 ENCOUNTER — Ambulatory Visit (HOSPITAL_COMMUNITY)
Admission: RE | Admit: 2023-08-09 | Discharge: 2023-08-09 | Disposition: A | Discharge status: Home / Self Care | Payer: 59 | Source: Ambulatory Visit | Attending: Internal Medicine | Admitting: Internal Medicine

## 2023-08-09 VITALS — BP 166/90 | HR 54 | Ht 68.0 in | Wt 152.2 lb

## 2023-08-09 DIAGNOSIS — I480 Paroxysmal atrial fibrillation: Secondary | ICD-10-CM | POA: Insufficient documentation

## 2023-08-09 DIAGNOSIS — D6869 Other thrombophilia: Secondary | ICD-10-CM | POA: Diagnosis not present

## 2023-08-09 DIAGNOSIS — Z7901 Long term (current) use of anticoagulants: Secondary | ICD-10-CM | POA: Diagnosis not present

## 2023-08-09 NOTE — Progress Notes (Signed)
 Primary Care Physician: Avva, Ravisankar, MD Referring Physician: Dr. Sharri Dee William Nixon is a 63 y.o. male with a h/o afib that is in the afib clinic for f/u ablation performed 03/31/22 by Dr. Lawana Nixon. Pt has done well without any s/s of afib since procedure. No swallowing or groin issues. History of being on Multaq . Complaint with anticoagulation.  On follow up 08/09/23, he is currently in NSR. He has had no episodes of Afib since last office visit. He is doing well and stays very active. He tries to walk or run 4 miles most days with going to gym several days a week. No bleeding issues on Eliquis  5 mg BID.   Today, he denies symptoms of palpitations, chest pain, shortness of breath, orthopnea, PND, lower extremity edema, dizziness, presyncope, syncope, or neurologic sequela. The patient is tolerating medications without difficulties and is otherwise without complaint today.   Past Medical History:  Diagnosis Date   Diabetes mellitus    type II   Hyperlipidemia    pt. states that hdl ldl was elevated denies hyperlipidemia   Hypertension    Hyperthyroidism    pt. denies having any thyroid  issues   Paroxysmal atrial fibrillation (HCC)    Personal history of colonic polyps - adenomas 08/06/2013   07/2013 - 3 diminutive adenomas - repeat colonoscopy 2018   Right hydrocele    S/P ACL repair    right    Sleep apnea    C PAP   Past Surgical History:  Procedure Laterality Date   ATRIAL FIBRILLATION ABLATION N/A 03/31/2022   Procedure: ATRIAL FIBRILLATION ABLATION;  Surgeon: William Pump, MD;  Location: [facility];  Service: Cardiovascular;  Laterality: N/A;   COLONOSCOPY     KNEE SURGERY  2002   right    Current Outpatient Medications  Medication Sig Dispense Refill   apixaban  (ELIQUIS ) 5 MG TABS tablet Take 1 tablet (5 mg total) by mouth 2 (two) times daily. 60 tablet 5   Ascorbic Acid (VITAMIN C PO) Take 1 tablet by mouth daily.      Boswellia-Glucosamine-Vit D (OSTEO BI-FLEX ONE PER DAY PO) Take 1 tablet by mouth daily.     empagliflozin  (JARDIANCE ) 25 MG TABS tablet Take 1 tablet (25 mg total) by mouth daily before breakfast. 90 tablet 3   glipiZIDE  (GLUCOTROL  XL) 5 MG 24 hr tablet Take 2 tablets (10 mg total) by mouth daily with breakfast. 180 tablet 3   glipiZIDE  (GLUCOTROL ) 5 MG tablet Take 1 tablet (5 mg total) by mouth daily before breakfast. 30 tablet 3   metFORMIN  (GLUCOPHAGE ) 1000 MG tablet TAKE 1 TABLET BY MOUTH 2 TIMES A DAY 60 tablet 11   Multiple Vitamin (MULTIVITAMIN WITH MINERALS) TABS Take 1 tablet by mouth daily. Centrum     olmesartan  (BENICAR ) 20 MG tablet TAKE ONE TABLET BY MOUTH EVERY EVENING 90 tablet 2   OneTouch Delica Lancets 33G MISC Use to check blood sugar 3 times a day. 300 each 12   ONETOUCH VERIO test strip USE TO TEST THREE TIMES A DAY 50 strip 3   rosuvastatin (CRESTOR) 10 MG tablet Take 10 mg by mouth at bedtime.     TRADJENTA  5 MG TABS tablet Take 1 tablet (5 mg total) by mouth daily. 90 tablet 3   No current facility-administered medications for this encounter.    Allergies  Allergen Reactions   Beef-Derived Drug Products     Cultural    Dronedarone   Other Reaction(s): elevated liver transaminases   Dulaglutide Nausea And Vomiting   ROS- All systems are reviewed and negative except as per the HPI above  Physical Exam: Vitals:   08/09/23 0856  BP: (!) 166/90  Pulse: (!) 54  Weight: 69 kg  Height: 5\' 8"  (1.727 m)   Wt Readings from Last 3 Encounters:  08/09/23 69 kg  05/16/23 68.9 kg  02/08/23 69.3 kg    Labs: Lab Results  Component Value Date   NA 137 07/08/2022   K 4.3 07/08/2022   CL 99 07/08/2022   CO2 24 07/08/2022   GLUCOSE 174 (H) 07/08/2022   BUN 15 07/08/2022   CREATININE 0.71 (L) 07/08/2022   CALCIUM 9.6 07/08/2022   MG 1.9 07/15/2021   No results found for: "INR" Lab Results  Component Value Date   CHOL 163 01/16/2018   HDL 27.00 (L)  01/16/2018   LDLCALC 103 (H) 01/16/2018   TRIG 165.0 (H) 01/16/2018   GEN- The patient is well appearing, alert and oriented x 3 today.   Neck - no JVD or carotid bruit noted Lungs- Clear to ausculation bilaterally, normal work of breathing Heart- Regular rate and rhythm, no murmurs, rubs or gallops, PMI not laterally displaced Extremities- no clubbing, cyanosis, or edema Skin - no rash or ecchymosis noted   EKG- Vent. rate 54 BPM PR interval 162 ms QRS duration 100 ms QT/QTcB 426/403 ms P-R-T axes 70 89 71 Sinus bradycardia Incomplete right bundle branch block Nonspecific T wave abnormality Abnormal ECG When compared with ECG of 28-Apr-2022 11:50, PREVIOUS ECG IS PRESENT  ECHO 07/22/21: Normal LV systolic function with visual EF 60-65%. Left ventricle cavity is normal in size. Mild concentric hypertrophy of the left ventricle. Normal global wall motion. Unable to evaluate diastolic function due to underlying rhythm; but filling pattern suggestive of normal LAP. No significant valvular heart disease. Compared to study 04/09/2019 no significant change.   CHA2DS2-VASc Score = 3  The patient's score is based upon: CHF History: 0 HTN History: 1 Diabetes History: 1 Stroke History: 0 Vascular Disease History: 1 Age Score: 0 Gender Score: 0       ASSESSMENT AND PLAN: Paroxysmal Atrial Fibrillation (ICD10:  I48.0) The patient's CHA2DS2-VASc score is 3, indicating a 3.2% annual risk of stroke.   History of Afib ablation on 03/31/22 by Dr. Lawana Nixon.  He is currently in NSR. He will alternate between seeing Dr. Berry Nixon and Dr. Lawana Nixon every 6 months. He is happy with current management; no indication for changes at this time.    Secondary Hypercoagulable State (ICD10:  D68.69) The patient is at significant risk for stroke/thromboembolism based upon his CHA2DS2-VASc Score of 3.  Continue Apixaban  (Eliquis ).  Continue Eliquis  without interruption.    F/u with Dr. Berry Nixon in 6  months. Follow up Afib clinic prn.   [name], PA-C [facility] [address] [place], [place] [address] [phone]

## 2023-08-22 ENCOUNTER — Ambulatory Visit: Payer: 59 | Admitting: Internal Medicine

## 2023-08-22 LAB — HM DIABETES EYE EXAM

## 2023-09-03 ENCOUNTER — Other Ambulatory Visit: Payer: Self-pay | Admitting: Internal Medicine

## 2023-09-05 ENCOUNTER — Telehealth: Payer: Self-pay | Admitting: Family Medicine

## 2023-09-05 NOTE — Telephone Encounter (Signed)
 ..  Pt understands that although there may be some limitations with this type of visit, we will take all precautions to reduce any security or privacy concerns.  Pt understands that this will be treated like an in office visit and we will file with pt's insurance, and there may be a patient responsible charge related to this service. ? ?

## 2023-09-05 NOTE — Telephone Encounter (Signed)
 Noted, confirmed we are able to pull CPAP data from [employer].

## 2023-09-25 ENCOUNTER — Encounter: Payer: Self-pay | Admitting: Internal Medicine

## 2023-09-25 ENCOUNTER — Ambulatory Visit (INDEPENDENT_AMBULATORY_CARE_PROVIDER_SITE_OTHER): Admitting: Internal Medicine

## 2023-09-25 VITALS — BP 120/68 | HR 65 | Ht 68.0 in | Wt 150.6 lb

## 2023-09-25 DIAGNOSIS — E1169 Type 2 diabetes mellitus with other specified complication: Secondary | ICD-10-CM

## 2023-09-25 DIAGNOSIS — E041 Nontoxic single thyroid nodule: Secondary | ICD-10-CM

## 2023-09-25 DIAGNOSIS — E785 Hyperlipidemia, unspecified: Secondary | ICD-10-CM | POA: Diagnosis not present

## 2023-09-25 DIAGNOSIS — R7989 Other specified abnormal findings of blood chemistry: Secondary | ICD-10-CM

## 2023-09-25 DIAGNOSIS — E119 Type 2 diabetes mellitus without complications: Secondary | ICD-10-CM

## 2023-09-25 DIAGNOSIS — Z7984 Long term (current) use of oral hypoglycemic drugs: Secondary | ICD-10-CM | POA: Diagnosis not present

## 2023-09-25 LAB — POCT GLYCOSYLATED HEMOGLOBIN (HGB A1C): Hemoglobin A1C: 7.4 % — AB (ref 4.0–5.6)

## 2023-09-25 LAB — GLUCOSE, POCT (MANUAL RESULT ENTRY): POC Glucose: 154 mg/dL — AB (ref 70–99)

## 2023-09-25 MED ORDER — ACARBOSE 25 MG PO TABS: 25.0000 mg | ORAL_TABLET | Freq: Three times a day (TID) | ORAL | 5 refills | Status: AC

## 2023-09-25 NOTE — Patient Instructions (Addendum)
 Please use the following regimen: Medicine  Breakfast Lunch Dinner  Metformin  1000 M - 1000   M - 1000  Glipizide  5     G -5  Glipizide  XL 10 G XL -10      Jardiance  25 J - 25      Tradjenta  5 T - 5          Try to start acarbose 25 mg 3 times a day with meals.   Please stop at the lab.  Please return in 3 months with your sugar log.

## 2023-09-25 NOTE — Progress Notes (Signed)
 Patient ID: William Nixon, male   DOB: Jun 08, 1960, 63 y.o.   MRN: [id number]  HPI: William Nixon is a 63 y.o.-year-old male, returning for f/u DM2, dx in ~1998, non-insulin-dependent, un controlled, without long-term complications. Last visit 4.5 months ago.  Interim history: He had cardiac ablation for atrial fibrillation 03/31/2022. After his ablation, he resumed exercise: cardio, weights. No increased urination, blurry vision, nausea, chest pain. He did not change his diet since last visit.  DM2: Reviewed latest HbA1c levels: Lab Results  Component Value Date   HGBA1C 7.2 (A) 05/16/2023   HGBA1C 7.7 (A) 12/07/2022   HGBA1C 6.7 (A) 06/15/2022  02/25/2023: HbA1c 7.4% 01/18/2022: HbA1c 6.9% 01/21/2021: HbA1c 7.1% 02/14/2020: HbA1c 6.6% During summer months, as he is more active, his HbA1c is usually lower.  He is on: Medicine  Breakfast Lunch Dinner  Metformin  M-1000  M - 1000  Glipizide    G -5  Glipizide  XL G XL 5 >> 10    Jardiance  25 >> off >> 10 >> 25 J 25    Januvia /Tradjenta   J/T 5     He also tried herbal remedies: - mustard seed daily >> helps a lot - bitter melon once a week.  He tried Rybelsus >> N/V/weight loss. Tried Trulicity >> N/V and Glu in the 200s. He feels that his sugar control deteriorated after stopping glipizide  XL before visit from 12/21 so we started this back.  Pt checks his sugars 1-2 times a day: - am:  109-138, 162 >> 150-165 >> 139-150 >> 139-179, 193 - 2h after b'fast: n/c  - before lunch: n/c >> 103-165 >> n/c  - 2h after lunch: 137, 177, 205 >> 190-210 >> 170-190 >> 162-195, 223 - before dinner: 92-114 >> n/c >> 96 - 2h after dinner: 170-180 >> 162-172 >> n/c >> <170 >> n/c  - bedtime: n/c - nighttime: n/c Lowest sugar was 90 >> 90s >> 120 >> 96 >> 139; he has hypoglycemia awareness in the 80s. Highest sugar was 205 >> 220 >> 190 >> 223.  Glucometer: AccuChek (?[employer])  His meals are mostly vegetarian. He continues to  walk/run.  -No CKD, last BUN/creatinine:  Lab Results  Component Value Date   BUN 15 07/08/2022   CREATININE 0.71 (L) 07/08/2022  On olmesartan  40 mg daily. ACR was elevated, while off ACE inhibitor/ARB, to allow metoprolol  dose to be increased for heart rate control.  However, he had an ablation afterwards and he came off metoprolol .  He restarted ARB.  -No persistent microalbuminuria:  01/18/2022: ACR 40 Lab Results  Component Value Date   MICRALBCREAT 22 03/07/2023   MICRALBCREAT 2.1 01/16/2018   MICRALBCREAT 2.5 01/10/2017   MICRALBCREAT 2.1 01/06/2016   MICRALBCREAT 2.3 09/10/2014   MICRALBCREAT 0.0 04/30/2014   MICRALBCREAT 0.6 10/02/2013   MICRALBCREAT 1.3 05/29/2013   MICRALBCREAT 0.8 11/07/2012   MICRALBCREAT 0.7 11/02/2011   -+ HL; last set of lipids:  01/18/2022: 98/184/16/45 01/21/2021: 124/174/24/65 02/11/2020: 130/104/28/81 03/22/2019: 109/114/29/57 Lab Results  Component Value Date   CHOL 163 01/16/2018   HDL 27.00 (L) 01/16/2018   LDLCALC 103 (H) 01/16/2018   LDLDIRECT 96.9 11/07/2012   TRIG 165.0 (H) 01/16/2018   CHOLHDL 6 01/16/2018  On Crestor 10.  - last eye exam was 08/22/2023: No DR (Dr. Rochell Chroman).   -No numbness and tingling in his feet.  Last foot exam 12/07/2022.  Thyroid  nodule: -Discovered incidentally, on CT.  Thyroid  U/S (08/13/2021): Parenchymal Echotexture: Mildly heterogenous  Isthmus: 4 mm  Right lobe:  4.4 x 1.4 x 1.5 cm  Left lobe: 4.7 x 1.4 x 1.8 cm  _________________________________________________________   Estimated total number of nodules >/= 1 cm: 1  _________________________________________________________   Nodule # 1:  Location: Left; Inferior  Maximum size: 1.3 cm; Other 2 dimensions: 1.1 x 1.0 cm  Composition: mixed cystic and solid (1)  Echogenicity: hypoechoic (2) Given size (<1.4 cm) and appearance, this nodule does NOT meet TI-RADS criteria for biopsy or dedicated follow-up.   _________________________________________________________   No additional thyroid  abnormality. No hypervascularity. Negative for adenopathy.   IMPRESSION: 1.3 cm left inferior thyroid  TR 3 nodule does not meet criteria for biopsy or follow-up. This correlates with the chest CT finding.   No other significant abnormality by ultrasound.  Pt denies: - feeling nodules in neck - hoarseness - dysphagia - choking  TFTs were reviewed: Lab Results  Component Value Date   TSH 3.95 05/16/2023   01/18/2022: TSH 3.31, free T4 1.6 01/21/2021: TSH 3.5 02/11/2020: TSH 3.44 08/12/2019: TSH 3.64, free T4 1.2  His thyroid  antibodies are not elevated: Component     Latest Ref Rng 05/16/2023  Thyroglobulin Ab     < or = 1 IU/mL <1   Thyroperoxidase Ab SerPl-aCnc     <9 IU/mL <1    No FH of thyroid  cancer. No h/o radiation tx to head or neck. No herbal supplements. No Biotin use. No steroids use.   He had a syncopal episode in 02/2019 with loss of consciousness for 1 to 2 minutes - he saw cardiology (Dr. Herman Longs) and this was considered a vasovagal syncope.  He was then found to have paroxysmal A. Fib 2/2 moderate OSA (now on a CPAP).  He had another syncopal episode 07/15/2021.  He was found to be hypotensive (dehydration + overtreatment with antihypertensive medications) at that time.  After the last episode, we decreased the dose of Jardiance  to half a 25 mg tablet >> then back to 25 mg.  He started a new job in 06/2016.  Now working from home.  Much less stress.  ROS: + See HPI  I reviewed pt's medications, allergies, PMH, social hx, family hx, and changes were documented in the history of present illness. Otherwise, unchanged from my initial visit note.  Past Medical History:  Diagnosis Date   Diabetes mellitus    type II   Hyperlipidemia    pt. states that hdl ldl was elevated denies hyperlipidemia   Hypertension    Hyperthyroidism    pt. denies having any thyroid  issues    Paroxysmal atrial fibrillation (HCC)    Personal history of colonic polyps - adenomas 08/06/2013   07/2013 - 3 diminutive adenomas - repeat colonoscopy 2018   Right hydrocele    S/P ACL repair    right    Sleep apnea    C PAP   Past Surgical History:  Procedure Laterality Date   ATRIAL FIBRILLATION ABLATION N/A 03/31/2022   Procedure: ATRIAL FIBRILLATION ABLATION;  Surgeon: Lei Pump, MD;  Location: [facility];  Service: Cardiovascular;  Laterality: N/A;   COLONOSCOPY     KNEE SURGERY  2002   right   History   Social History   Marital Status: Married    Spouse Name: N/A    Number of Children: 1   Occupational History   controller   Social History Main Topics   Smoking status: Never Smoker    Smokeless tobacco: Never Used   Alcohol Use:  1 drink(s) per week   Drug Use: No   Current Outpatient Medications on File Prior to Visit  Medication Sig Dispense Refill   apixaban  (ELIQUIS ) 5 MG TABS tablet Take 1 tablet (5 mg total) by mouth 2 (two) times daily. 60 tablet 5   Ascorbic Acid (VITAMIN C PO) Take 1 tablet by mouth daily.     Boswellia-Glucosamine-Vit D (OSTEO BI-FLEX ONE PER DAY PO) Take 1 tablet by mouth daily.     empagliflozin  (JARDIANCE ) 25 MG TABS tablet Take 1 tablet (25 mg total) by mouth daily before breakfast. 90 tablet 3   glipiZIDE  (GLUCOTROL  XL) 5 MG 24 hr tablet Take 2 tablets (10 mg total) by mouth daily with breakfast. 180 tablet 3   glipiZIDE  (GLUCOTROL ) 5 MG tablet Take 1 tablet (5 mg total) by mouth daily before breakfast. 30 tablet 3   metFORMIN  (GLUCOPHAGE ) 1000 MG tablet TAKE 1 TABLET BY MOUTH 2 TIMES A DAY 60 tablet 11   Multiple Vitamin (MULTIVITAMIN WITH MINERALS) TABS Take 1 tablet by mouth daily. Centrum     olmesartan  (BENICAR ) 20 MG tablet TAKE ONE TABLET BY MOUTH EVERY EVENING 90 tablet 2   OneTouch Delica Lancets 33G MISC Use to check blood sugar 3 times a day. 300 each 12   ONETOUCH VERIO test strip USE TO TEST THREE  TIMES A DAY 50 strip 3   rosuvastatin (CRESTOR) 10 MG tablet Take 10 mg by mouth at bedtime.     TRADJENTA  5 MG TABS tablet Take 1 tablet (5 mg total) by mouth daily. 90 tablet 3   No current facility-administered medications on file prior to visit.   Allergies  Allergen Reactions   Beef-Derived Drug Products     Cultural    Dronedarone      Other Reaction(s): elevated liver transaminases   Dulaglutide Nausea And Vomiting   Family History  Problem Relation Age of Onset   Diabetes Mother    Diabetes Father    Diabetes Sister    Diabetes Brother    Alcohol abuse Other    Diabetes Other    Hypertension Other    Colon cancer Neg Hx    Colon polyps Neg Hx    Crohn's disease Neg Hx    Esophageal cancer Neg Hx    Rectal cancer Neg Hx    Stomach cancer Neg Hx    Ulcerative colitis Neg Hx    PE: BP 120/68   Pulse 65   Ht 5\' 8"  (1.727 m)   Wt 150 lb 9.6 oz (68.3 kg)   SpO2 98%   BMI 22.90 kg/m   Wt Readings from Last 3 Encounters:  09/25/23 150 lb 9.6 oz (68.3 kg)  08/09/23 152 lb 3.2 oz (69 kg)  05/16/23 151 lb 12.8 oz (68.9 kg)   Constitutional: normal weight, in NAD Eyes:  EOMI, no exophthalmos ENT: no neck masses, no cervical lymphadenopathy Cardiovascular: RRR, No MRG Respiratory: CTA B Musculoskeletal: no deformities Skin:no rashes Neurological: no tremor with outstretched hands Diabetic Foot Exam - Simple   Simple Foot Form Diabetic Foot exam was performed with the following findings: Yes 09/25/2023  9:18 AM  Visual Inspection No deformities, no ulcerations, no other skin breakdown bilaterally: Yes Sensation Testing Intact to touch and monofilament testing bilaterally: Yes Pulse Check Posterior Tibialis and Dorsalis pulse intact bilaterally: Yes Comments    ASSESSMENT: 1. DM2, non-insulin-dependent, uncontrolled, without complications - also FH of mild DM on mother's side  2. HL  3.  Left thyroid   nodule  4.  Elevated TSH  PLAN:  1. Patient with  longstanding, fairly well-controlled type 2 diabetes, on oral antidiabetic regimen with metformin , sulfonylurea, SGLT2 inhibitor and DPP 4 inhibitor.  He was previously off Jardiance  due to UTI and the rash which persisted despite Diflucan .  This resolved using antifungal cream and Zeasorb along with 1 week of Diflucan .  Afterwards, we were able to add back low-dose Jardiance  and we increased the dose subsequently.  No further side effects. HbA1c decreased from 7.7% to 7.4%, and, at last visit, 7.2%.  Sugars were still above target at that time so I advised him to move the entire metformin  dose at dinnertime to hopefully help with the higher blood sugars after dinner and then in the morning.  We also discussed about the CGM but he remembered that this was not covered for him in the past. - At today's visit, sugars are higher than before.  He did try increasing metformin  with dinner and skipping it in the morning but this did not work well for him, so he is back to taking 1 tablet twice a day.  He continues the rest of the regimen.  He did not change his diet or his exercise regimen.  He went to [place] in 06/2023, but he has been back in [place]  for 3 months now. -We discussed about checking him for type 1 diabetes (blood sugar today in the 150s).  We discussed that he may need insulin if the test is positive for insulin deficiency.  For now, I suggested at least to try acarbose at least until the results return.  Discussed about potential side effects (increased gas).  I advised him to start this with the meal with most carbs, but he may need to increase this with all meals.  - I suggested to:  Patient Instructions  Please use the following regimen: Medicine  Breakfast Lunch Dinner  Metformin  1000 M - 1000   M - 1000  Glipizide  5     G -5  Glipizide  XL 10 G XL -10      Jardiance  25 J - 25      Tradjenta  5 T - 5         Please stop at the lab.  Please return in 3 months with your sugar log.    - we checked  his HbA1c: 7.4% (higher) - advised to check sugars at different times of the day - 1x a day, rotating check times - advised for yearly eye exams >> he is UTD - return to clinic in 3 months  2. HL - Latest lipid was at goal:  -He continues on Crestor 10 mg daily without side effects  3.  Left thyroid  nodule -This was incidentally seen on CT scan and further evaluated with thyroid  ultrasound.  The nodule appears to be situated in the left inferior gland, and it is solid/cystic, hypoechoic.  It measures 1.3 x 1.1 x 1 cm. - Since the nodule does not have other high risk features, we are following him expectantly - He has no neck compression symptoms or masses felt on palpation of his neck today  4.  Elevated TSH - Latest TFTs and thyroid  antibodies were all normal Component     Latest Ref Rng 05/16/2023  TSH     0.40 - 4.50 mIU/L 3.95   T4,Free(Direct)     0.8 - 1.8 ng/dL 1.3   Triiodothyronine,Free,Serum     2.3 - 4.2 pg/mL 3.4   Thyroglobulin  Ab     < or = 1 IU/mL <1   Thyroperoxidase Ab SerPl-aCnc     <9 IU/mL <1   - Will continue to keep an eye on his TFTs, checking again at next visit  Orders Placed This Encounter  Procedures   Glucose, fasting   C-peptide   ZNT8 Antibodies   GAD65, IA-2, and Insulin Autoantibody serum   GAD65, IA-2, and Insulin Autoantibody serum   POCT glycosylated hemoglobin (Hb A1C)   POCT glucose (manual entry)   Emilie Harden, MD PhD [facility]

## 2023-09-26 ENCOUNTER — Ambulatory Visit: Payer: Self-pay | Admitting: Internal Medicine

## 2023-09-29 NOTE — Patient Instructions (Signed)

## 2023-09-29 NOTE — Progress Notes (Unsigned)
 PATIENT: William Nixon DOB: January 12, 1961  REASON FOR VISIT: follow up HISTORY FROM: patient  Virtual Visit via Telephone Note  I connected with William Nixon on 10/03/23 at  8:15 AM EDT by telephone and verified that I am speaking with the correct person using two identifiers.   I discussed the limitations, risks, security and privacy concerns of performing an evaluation and management service by telephone and the availability of in person appointments. I also discussed with the patient that there may be a patient responsible charge related to this service. The patient expressed understanding and agreed to proceed.   History of Present Illness:  10/03/23 ALL: William Nixon returns for follow up for OSA on CPAP. He continues to do well on therapy. He is using CPAP nightly for about 7 hours, on average. He denies concerns with machine or supplies. Using FFM. He is sleeping well most nights. He denies concerns, today.     09/06/2022 ALL:  William Nixon is a 63 y.o. male here today for follow up for OSA on CPAP. He continues to do well on therapy. He is unsure if he has noticed any significant benefit. He denies any specific concerns with machine or supplies. He is s/p ablation 03/2022. He is followed closely by PCP and cardiology.     History (copied from Dr Dohmeier's previous note)  William Nixon,  is a 63  year- old Asian male patient of [place] descent , and seen in a RV on 08/23/2021. Dr Berry Bristol saw him on 08-12-2021: after a syncope and low BP, dehydrated and was on decongestants.  He is interested in hearing about INSPIRE;    He underwent sleep testing here last year -his sleep test actually was performed on 07/24/2019 he had a moderate degree of sleep apnea obstructive with an AHI of 19.6 some snoring was recorded no REM sleep accentuation was noted.  There was no note of hypoxemia.   So this patient could potentially be a candidate for inspire device.  Looking at his  current compliance. In spite of not exactly living his CPAP he has been a highly compliant user use the machine 20 out of 30 days with a compliance and hours of 93%.  Average user time is 6 hours and 46 minutes and he fulfills all insurance that compliance data.     His machine was set up on 05-22-2019.  It is an AutoSet AirSense 10 with a minimum pressure of 5 and a maximum pressure of 16 cm water ,  as well as 3 cm EPR level.  The residual apnea is 0 point 0.8/h which is a good resolution of apnea.  95th percentile pressure is 11 cmH2O well within the current settings.  Air leak is low at the 95th percentile leaks only 1.2 L a minute that is an excellent fit in terms of mask.  So basically I would hesitate to recommend an alternative therapy because this 1 seems to work so well.  The patient states that when he had his syncope spell it was also discovered that he still was in atrial fibrillation.  He has not been undergoing cardiac ablation or cardiac reset', just beta blocker rate controlled.    Inspire works in non-REM sleep apnea without hypoxia. He is less interested in a surgical procedure.    Observations/Objective:  Generalized: Well developed, in no acute distress  Mentation: Alert oriented to time, place, history taking. Follows all commands speech and language fluent   Assessment  and Plan:  63 y.o. year old male  has a past medical history of Diabetes mellitus, Hyperlipidemia, Hypertension, Hyperthyroidism, Paroxysmal atrial fibrillation (HCC), Personal history of colonic polyps - adenomas (08/06/2013), Right hydrocele, S/P ACL repair, and Sleep apnea. here with    ICD-10-CM   1. OSA on CPAP  G47.33 For home use only DME continuous positive airway pressure (CPAP)     William Nixon continues to do well on CPAP therapy. Compliance report reveals excellent compliance. He was encouraged to continue using CPAP nightly for at least 4 hours. Supply orders have been updated. Healthy lifestyle  habits encouraged. He will return to see us  in 1 year, sooner if needed.   Orders Placed This Encounter  Procedures   For home use only DME continuous positive airway pressure (CPAP)    Heated Humidity with all supplies as needed    Length of Need:   Lifetime    Patient has OSA or probable OSA:   Yes    Is the patient currently using CPAP in the home:   Yes    Settings:   Other see comments    CPAP supplies needed:   Mask, headgear, cushions, filters, heated tubing and water chamber    No orders of the defined types were placed in this encounter.    Follow Up Instructions:  I discussed the assessment and treatment plan with the patient. The patient was provided an opportunity to ask questions and all were answered. The patient agreed with the plan and demonstrated an understanding of the instructions.   The patient was advised to call back or seek an in-person evaluation if the symptoms worsen or if the condition fails to improve as anticipated.  I provided 15 minutes of non-face-to-face time during this encounter. Patient located at their place of residence during Mychart visit. Provider is in the office.    Alyzae Hawkey, NP

## 2023-10-02 ENCOUNTER — Encounter: Payer: Self-pay | Admitting: *Deleted

## 2023-10-03 ENCOUNTER — Telehealth (INDEPENDENT_AMBULATORY_CARE_PROVIDER_SITE_OTHER): Admitting: Family Medicine

## 2023-10-03 ENCOUNTER — Encounter: Payer: Self-pay | Admitting: Family Medicine

## 2023-10-03 DIAGNOSIS — G4733 Obstructive sleep apnea (adult) (pediatric): Secondary | ICD-10-CM | POA: Diagnosis not present

## 2023-10-04 LAB — DIABETES TYPE 1 AUTOANTIBODY PANEL
Glutamic Acid Decarb Ab: <5 [IU]/mL (ref <5)
IA-2 Antibody: <5.4 U/mL (ref <5.4)
Insulin Antibodies, Human: <0.4 U/mL (ref <0.4)
ZINC TRANSPORTER 8 (ZnT8) ANTIBODY: <10 U/mL (ref <15)

## 2023-10-04 LAB — C-PEPTIDE: C-Peptide: 2.06 ng/mL (ref 0.80–3.85)

## 2023-10-04 LAB — GLUCOSE, FASTING: Glucose, Bld: 160 mg/dL — ABNORMAL HIGH (ref 65–99)

## 2023-10-09 ENCOUNTER — Other Ambulatory Visit: Payer: Self-pay | Admitting: Internal Medicine

## 2023-12-11 ENCOUNTER — Other Ambulatory Visit: Payer: Self-pay

## 2023-12-11 DIAGNOSIS — I480 Paroxysmal atrial fibrillation: Secondary | ICD-10-CM

## 2023-12-11 MED ORDER — APIXABAN 5 MG PO TABS: 5.0000 mg | ORAL_TABLET | Freq: Two times a day (BID) | ORAL | 5 refills | Status: AC

## 2023-12-11 NOTE — Telephone Encounter (Signed)
 Prescription refill request for Eliquis  received. Indication:afib Last office visit:4/25 Scr:0.76  11/24 Age: 63 Weight:68.3  kg  Prescription refilled

## 2023-12-28 ENCOUNTER — Ambulatory Visit: Admitting: Cardiology

## 2024-01-10 ENCOUNTER — Encounter: Payer: Self-pay | Admitting: Internal Medicine

## 2024-01-10 ENCOUNTER — Ambulatory Visit: Admitting: Internal Medicine

## 2024-01-10 VITALS — BP 130/70 | HR 64 | Ht 68.0 in | Wt 151.0 lb

## 2024-01-10 DIAGNOSIS — E785 Hyperlipidemia, unspecified: Secondary | ICD-10-CM

## 2024-01-10 DIAGNOSIS — R7989 Other specified abnormal findings of blood chemistry: Secondary | ICD-10-CM | POA: Diagnosis not present

## 2024-01-10 DIAGNOSIS — E041 Nontoxic single thyroid nodule: Secondary | ICD-10-CM

## 2024-01-10 DIAGNOSIS — E119 Type 2 diabetes mellitus without complications: Secondary | ICD-10-CM | POA: Diagnosis not present

## 2024-01-10 DIAGNOSIS — E1169 Type 2 diabetes mellitus with other specified complication: Secondary | ICD-10-CM

## 2024-01-10 LAB — POCT GLYCOSYLATED HEMOGLOBIN (HGB A1C): Hemoglobin A1C: 7 % — AB (ref 4.0–5.6)

## 2024-01-10 NOTE — Patient Instructions (Addendum)
 Please use the following regimen: Medicine  Breakfast Lunch Dinner  Metformin  1000 M - 1000   M - 1000  Glipizide  5     G -5  Glipizide  XL 10 G XL -10      Jardiance  25 J - 25      Tradjenta  5 T - 5      Acarbose  25   A - 25/50     Try to check some sugars after dinner and increase the Acarbose  to 50 mg before dinner if they are elevated.  Please return in 4 months with your sugar log.

## 2024-01-10 NOTE — Addendum Note (Signed)
 Addended by: CLEOTILDE ROLIN RAMAN on: 01/10/2024 11:02 AM   Modules accepted: Orders

## 2024-01-10 NOTE — Progress Notes (Signed)
 Patient ID: William Nixon, male   DOB: [birth year], 63 y.o.   MRN: [id number]  HPI: William Nixon is a 63 y.o.-year-old male, returning for f/u DM2, dx in ~1998, non-insulin-dependent, un controlled, without long-term complications. Last visit 3 months ago.  Interim history: He continues exercise: cardio, weights. No increased urination, blurry vision (but does have floaters), nausea, chest pain. He feels well, without complaints today.  DM2: Reviewed latest HbA1c levels: Lab Results  Component Value Date   HGBA1C 7.4 (A) 09/25/2023   HGBA1C 7.2 (A) 05/16/2023   HGBA1C 7.7 (A) 12/07/2022  02/25/2023: HbA1c 7.4% 01/18/2022: HbA1c 6.9% 01/21/2021: HbA1c 7.1% 02/14/2020: HbA1c 6.6% During summer months, as he is more active, his HbA1c is usually lower.  He is on: Medicine  Breakfast Lunch Dinner  Metformin  1000 M - 1000   M - 1000  Glipizide  5     G -5  Glipizide  XL 10 G XL -10      Jardiance  25 J - 25      Tradjenta  5 T - 5      Acarbose  25   A - 25   He also tried herbal remedies: - mustard seed daily >> helping - bitter melon once a week.  He tried Rybelsus >> N/V/weight loss. Tried Trulicity >> N/V and Glu in the 200s. He feels that his sugar control deteriorated after stopping glipizide  XL before visit from 12/21 so we started this back.  Pt checks his sugars 1-2 times a day: - am:  150-165 >> 139-150 >> 139-179, 193 >> 140s - 2h after b'fast: n/c  - before lunch: n/c >> 103-165 >> n/c  - 2h after lunch: 190-210 >> 170-190 >> 162-195, 223 >> 150-170 - before dinner: 92-114 >> n/c >> 96 >> n/c - 2h after dinner: 162-172 >> n/c >> <170 >> n/c  - bedtime: n/c - nighttime: n/c Lowest sugar was 96 >> 139 >> 140; he has hypoglycemia awareness in the 80s. Highest sugar was 223 >> 170.  Glucometer: AccuChek (?[employer])  His meals are mostly vegetarian. He continues to walk/run.  -No CKD, last BUN/creatinine:  Lab Results  Component Value Date   BUN 15  07/08/2022   CREATININE 0.71 (L) 07/08/2022  On olmesartan  40 mg daily. ACR was elevated, while off ACE inhibitor/ARB, to allow metoprolol  dose to be increased for heart rate control.  However, he had an ablation afterwards and he came off metoprolol .  He restarted ARB.  -No persistent microalbuminuria: Lab Results  Component Value Date   MICRALBCREAT 22 03/07/2023   MICRALBCREAT 0.0 04/30/2014   MICRALBCREAT 1.3 05/29/2013   MICRALBCREAT 0.7 11/02/2011   MICRALBCREAT 1.8 10/28/2009   MICRALBCREAT 5.8 10/21/2008   MICRALBCREAT 6.6 10/01/2007   MICRALBCREAT 5.2 09/06/2006   -+ HL; last set of lipids:  01/18/2022: 98/184/16/45 01/21/2021: 124/174/24/65 02/11/2020: 130/104/28/81 03/22/2019: 109/114/29/57 Lab Results  Component Value Date   CHOL 163 01/16/2018   HDL 27.00 (L) 01/16/2018   LDLCALC 103 (H) 01/16/2018   LDLDIRECT 96.9 11/07/2012   TRIG 165.0 (H) 01/16/2018   CHOLHDL 6 01/16/2018  On Crestor 10.  - last eye exam was 08/22/2023: No DR (Dr. Arley Ruder).   -No numbness and tingling in his feet.  Last foot exam 07/26/2023.  Thyroid  nodule: -Discovered incidentally, on CT.  Thyroid  U/S (08/13/2021): Parenchymal Echotexture: Mildly heterogenous  Isthmus: 4 mm  Right lobe:  4.4 x 1.4 x 1.5 cm  Left lobe: 4.7 x 1.4 x 1.8 cm  _________________________________________________________  Estimated total number of nodules >/= 1 cm: 1  _________________________________________________________   Nodule # 1:  Location: Left; Inferior  Maximum size: 1.3 cm; Other 2 dimensions: 1.1 x 1.0 cm  Composition: mixed cystic and solid (1)  Echogenicity: hypoechoic (2) Given size (<1.4 cm) and appearance, this nodule does NOT meet TI-RADS criteria for biopsy or dedicated follow-up.  _________________________________________________________   No additional thyroid  abnormality. No hypervascularity. Negative for adenopathy.   IMPRESSION: 1.3 cm left inferior thyroid  TR 3 nodule  does not meet criteria for biopsy or follow-up. This correlates with the chest CT finding.   No other significant abnormality by ultrasound.  Pt denies: - feeling nodules in neck - hoarseness - dysphagia - choking  TFTs were reviewed: Lab Results  Component Value Date   TSH 3.95 05/16/2023   01/18/2022: TSH 3.31, free T4 1.6 01/21/2021: TSH 3.5 02/11/2020: TSH 3.44 08/12/2019: TSH 3.64, free T4 1.2  His thyroid  antibodies are not elevated: Component     Latest Ref Rng 05/16/2023  Thyroglobulin Ab     < or = 1 IU/mL <1   Thyroperoxidase Ab SerPl-aCnc     <9 IU/mL <1    No FH of thyroid  cancer. No h/o radiation tx to head or neck. No herbal supplements. No Biotin use. No steroids use.   He had a syncopal episode in 02/2019 with loss of consciousness for 1 to 2 minutes - he saw cardiology (Dr. Lorie) and this was considered a vasovagal syncope.  He was then found to have paroxysmal A. Fib 2/2 moderate OSA (now on a CPAP).  He had another syncopal episode 07/15/2021.  He was found to be hypotensive (dehydration + overtreatment with antihypertensive medications) at that time.  After the last episode, we decreased the dose of Jardiance  to half a 25 mg tablet >> then back to 25 mg.  He had cardiac ablation for atrial fibrillation 03/31/2022.   He started a new job in 06/2016.  Now working from home.  Much less stress.  ROS: + See HPI  I reviewed pt's medications, allergies, PMH, social hx, family hx, and changes were documented in the history of present illness. Otherwise, unchanged from my initial visit note.  Past Medical History:  Diagnosis Date   Diabetes mellitus    type II   Hyperlipidemia    pt. states that hdl ldl was elevated denies hyperlipidemia   Hypertension    Hyperthyroidism    pt. denies having any thyroid  issues   Paroxysmal atrial fibrillation (HCC)    Personal history of colonic polyps - adenomas 08/06/2013   07/2013 - 3 diminutive adenomas - repeat  colonoscopy 2018   Right hydrocele    S/P ACL repair    right    Sleep apnea    C PAP   Past Surgical History:  Procedure Laterality Date   ATRIAL FIBRILLATION ABLATION N/A 03/31/2022   Procedure: ATRIAL FIBRILLATION ABLATION;  Surgeon: Inocencio Soyla Lunger, MD;  Location: [facility];  Service: Cardiovascular;  Laterality: N/A;   COLONOSCOPY     KNEE SURGERY  2002   right   History   Social History   Marital Status: Married    Spouse Name: N/A    Number of Children: 1   Occupational History   controller   Social History Main Topics   Smoking status: Never Smoker    Smokeless tobacco: Never Used   Alcohol Use:     1 drink(s) per week   Drug Use: No  Current Outpatient Medications on File Prior to Visit  Medication Sig Dispense Refill   acarbose  (PRECOSE ) 25 MG tablet Take 1 tablet (25 mg total) by mouth 3 (three) times daily with meals. 90 tablet 5   apixaban  (ELIQUIS ) 5 MG TABS tablet Take 1 tablet (5 mg total) by mouth 2 (two) times daily. 60 tablet 5   Ascorbic Acid (VITAMIN C PO) Take 1 tablet by mouth daily.     Boswellia-Glucosamine-Vit D (OSTEO BI-FLEX ONE PER DAY PO) Take 1 tablet by mouth daily.     empagliflozin  (JARDIANCE ) 25 MG TABS tablet Take 1 tablet (25 mg total) by mouth daily before breakfast. 90 tablet 3   glipiZIDE  (GLUCOTROL  XL) 5 MG 24 hr tablet Take 2 tablets (10 mg total) by mouth daily with breakfast. 180 tablet 3   glipiZIDE  (GLUCOTROL ) 5 MG tablet TAKE 1 TABLET BY MOUTH DAILY BEFORE BREAKFAST 30 tablet 5   metFORMIN  (GLUCOPHAGE ) 1000 MG tablet TAKE 1 TABLET BY MOUTH 2 TIMES A DAY 60 tablet 11   Multiple Vitamin (MULTIVITAMIN WITH MINERALS) TABS Take 1 tablet by mouth daily. Centrum     olmesartan  (BENICAR ) 20 MG tablet TAKE ONE TABLET BY MOUTH EVERY EVENING 90 tablet 2   OneTouch Delica Lancets 33G MISC Use to check blood sugar 3 times a day. 300 each 12   ONETOUCH VERIO test strip USE TO TEST THREE TIMES A DAY 50 strip 3   rosuvastatin  (CRESTOR) 10 MG tablet Take 10 mg by mouth at bedtime.     TRADJENTA  5 MG TABS tablet Take 1 tablet (5 mg total) by mouth daily. 90 tablet 3   No current facility-administered medications on file prior to visit.   Allergies  Allergen Reactions   Beef-Derived Drug Products     Cultural    Dronedarone      Other Reaction(s): elevated liver transaminases   Dulaglutide Nausea And Vomiting   Family History  Problem Relation Age of Onset   Diabetes Mother    Diabetes Father    Diabetes Sister    Diabetes Brother    Alcohol abuse Other    Diabetes Other    Hypertension Other    Colon cancer Neg Hx    Colon polyps Neg Hx    Crohn's disease Neg Hx    Esophageal cancer Neg Hx    Rectal cancer Neg Hx    Stomach cancer Neg Hx    Ulcerative colitis Neg Hx    PE: BP 130/70   Pulse 64   Ht 5' 8 (1.727 m)   Wt 151 lb (68.5 kg)   SpO2 98%   BMI 22.96 kg/m   Wt Readings from Last 3 Encounters:  01/10/24 151 lb (68.5 kg)  09/25/23 150 lb 9.6 oz (68.3 kg)  08/09/23 152 lb 3.2 oz (69 kg)   Constitutional: normal weight, in NAD Eyes:  EOMI, no exophthalmos ENT: no neck masses, no cervical lymphadenopathy Cardiovascular: RRR, No MRG Respiratory: CTA B Musculoskeletal: no deformities Skin:no rashes Neurological: no tremor with outstretched hands  ASSESSMENT: 1. DM2, non-insulin-dependent, uncontrolled, without complications - also FH of mild DM on mother's side  Component     Latest Ref Rng 09/25/2023  Glutamic Acid Decarb Ab     <5 IU/mL <5   Insulin Antibodies, Human     <0.4 U/mL <0.4   IA-2 Antibody     <5.4 U/mL <5.4   ZINC TRANSPORTER 8 (ZnT8) ANTIBODY     <15 U/mL <10   Glucose  65 - 99 mg/dL 839 (H)   C-Peptide     0.80 - 3.85 ng/mL 2.06     2. HL  3.  Left thyroid  nodule  4.  Elevated TSH  PLAN:  1. Patient with longstanding, fairly well-controlled type 2 diabetes, on oral antidiabetic regimen with metformin , sulfonylurea, SGLT2 inhibitor and DPP 4  inhibitor, with worse control at last visit.  At that time, HbA1c was 7.4%, increased from 7.2%.  Sugars are higher than before despite not changing his diet or exercise regimen.  He went to [place] 3 months prior to our last appointment and usually sugars are better for him when he is there.  We did discuss at last visit about checking him for type 1 diabetes but he did not appear to be insulin deficient and his antipancreatic antibodies are not elevated.  I recommended to try acarbose  to block carbohydrate absorption.  We discussed all potential side effects (increased gas).  I advised him to start this with a meals with more carbs and then may need to add it before each meal. -At today's visit, sugars improved in the morning, however, they are still above target, in the 140s.  He is also checking after lunch and his sugars are usually at goal.  He is taking acarbose  before dinner only and I believe that this is the reason why his morning blood sugars improved.  Unfortunately, he is not checking blood sugars after dinner and we discussed about doing so to see if we need to adjust the dose of acarbose .  He does tolerate the dose well.  I did not recommend that the changes in his regimen.  I did advise him to check some blood sugars at different times of the day, though. - I suggested to:  Patient Instructions  Please use the following regimen: Medicine  Breakfast Lunch Dinner  Metformin  1000 M - 1000   M - 1000  Glipizide  5     G -5  Glipizide  XL 10 G XL -10      Jardiance  25 J - 25      Tradjenta  5 T - 5      Acarbose  25   A - 25/50     Try to check some sugars after dinner and increase the Acarbose  to 50 mg before dinner if they are elevated.  Please return in 4 months with your sugar log.     - we checked his HbA1c: 7.0% (lower) - advised to check sugars at different times of the day - 1x a day, rotating check times - advised for yearly eye exams >> he is UTD - he will have an annual physical  exam and would like to get his labs checked at that time. - return to clinic in 4 months  2. HL - His latest lipid panel was at goal:  -He continues on Crestor 10 mg daily without side effects  3.  Left thyroid  nodule -This was incidentally seen on CT scan and further evaluated with thyroid  ultrasound.  The nodule appears to be situated in the left inferior gland, and it is solid/cystic, hypoechoic.  It measures 1.3 x 1.1 x 1 cm. - Since the nodule does not have any other high risk features, we are following him expectantly. - He has no neck compression symptoms or masses felt on palpation of his neck today  4.  Elevated TSH - Latest TFTs were normal.  We also checked his thyroid  antibodies and they were all normal:  Component     Latest Ref Rng 05/16/2023  Thyroglobulin Ab     < or = 1 IU/mL <1   Thyroperoxidase Ab SerPl-aCnc     <9 IU/mL <1   - Will continue to keep an eye on his TFTs.  He will have a TSH checked by PCP at his next annual physical exam, which is pending.  Lela Fendt, MD PhD [facility]

## 2024-01-19 ENCOUNTER — Encounter: Payer: Self-pay | Admitting: Cardiology

## 2024-01-19 ENCOUNTER — Ambulatory Visit: Attending: Cardiology | Admitting: Cardiology

## 2024-01-19 VITALS — BP 142/71 | HR 63 | Resp 16 | Ht 68.0 in | Wt 152.0 lb

## 2024-01-19 DIAGNOSIS — E785 Hyperlipidemia, unspecified: Secondary | ICD-10-CM

## 2024-01-19 DIAGNOSIS — I10 Essential (primary) hypertension: Secondary | ICD-10-CM | POA: Diagnosis not present

## 2024-01-19 DIAGNOSIS — E1169 Type 2 diabetes mellitus with other specified complication: Secondary | ICD-10-CM

## 2024-01-19 DIAGNOSIS — I2584 Coronary atherosclerosis due to calcified coronary lesion: Secondary | ICD-10-CM

## 2024-01-19 DIAGNOSIS — I480 Paroxysmal atrial fibrillation: Secondary | ICD-10-CM | POA: Diagnosis not present

## 2024-01-19 DIAGNOSIS — Z01812 Encounter for preprocedural laboratory examination: Secondary | ICD-10-CM

## 2024-01-19 DIAGNOSIS — R9431 Abnormal electrocardiogram [ECG] [EKG]: Secondary | ICD-10-CM

## 2024-01-19 DIAGNOSIS — I2510 Atherosclerotic heart disease of native coronary artery without angina pectoris: Secondary | ICD-10-CM

## 2024-01-19 NOTE — Progress Notes (Signed)
 Cardiology Office Note:  .   Date:  01/20/2024  ID:  William Nixon, DOB 28-Apr-1960, MRN [id number] PCP: Janey Santos, MD  [facility] Providers Cardiologist:  Gordy Bergamo, MD Electrophysiologist:  Soyla Gladis Norton, MD   History of Present Illness: .   William Nixon is a 63 y.o. . [place] male patient with elevated coronary calcium score in the 98 percentile in 2023, diabetes mellitus, primary hypertension, hyperlipidemia, presents for follow-up of paroxysmal atrial fibrillation, S/P atrial fibrillation ablation on 03/31/2022 by Dr. Norton.    He has had a negative nuclear stress test with normal perfusion on 03/20/2019 and echocardiogram on 07/22/2021 revealing normal LVEF without significant valvular abnormality.  Cardiac Studies relevent.        Discussed the use of AI scribe software for clinical note transcription with the patient, who gave verbal consent to proceed.  History of Present Illness William Nixon is a 63 year old male with hypertension and diabetes who presents for cardiovascular evaluation.  He has not experienced any issues since his ablation procedure and uses a CPAP machine regularly. His Apple Watch shows a low percentage of heart rate irregularities, with no episodes of atrial fibrillation post-ablation.  Blood pressure is stable, with recent readings of 121/68 mmHg and a pulse of 62 bpm. Blood pressure tends to be lower in the mornings. He takes olmesartan  20 mg, but only half the dose due to concerns about low blood pressure.  He maintains an active lifestyle, running 3.5 to 4 miles during lunchtime and engaging in light weightlifting in the evenings, without chest pain or shortness of breath.  He has a history of high coronary calcium score and is concerned about potential heart blockages. A stress test was conducted in 2020.   Labs   Care everywhere/Faxed External Labs:  Labs 03/03/2023:  Urinary albumin to creatinine  ratio 22.  Labs 09/25/2023:  A1c 7.4%.  Labs 02/24/2023:  Hb 13.3/HCT 41.6, platelets 198.  BUN 13, creatinine 0.76, eGFR 102 mL, potassium 4.5, LFTs normal.  Total cholesterol 120, triglycerides 169, HDL 23, LDL 68.  ROS  Review of Systems  Cardiovascular:  Negative for chest pain, dyspnea on exertion and leg swelling.   Physical Exam:   VS:  BP (!) 142/71 (BP Location: Left Arm, Patient Position: Sitting, Cuff Size: Normal)   Pulse 63   Resp 16   Ht 5' 8 (1.727 m)   Wt 152 lb (68.9 kg)   SpO2 98%   BMI 23.11 kg/m    Wt Readings from Last 3 Encounters:  01/19/24 152 lb (68.9 kg)  01/10/24 151 lb (68.5 kg)  09/25/23 150 lb 9.6 oz (68.3 kg)    BP Readings from Last 3 Encounters:  01/19/24 (!) 142/71  01/10/24 130/70  09/25/23 120/68   Physical Exam Neck:     Vascular: No carotid bruit or JVD.  Cardiovascular:     Rate and Rhythm: Normal rate and regular rhythm.     Pulses: Intact distal pulses.     Heart sounds: Normal heart sounds. No murmur heard.    No gallop.  Pulmonary:     Effort: Pulmonary effort is normal.     Breath sounds: Normal breath sounds.  Abdominal:     General: Bowel sounds are normal.     Palpations: Abdomen is soft.  Musculoskeletal:     Right lower leg: No edema.     Left lower leg: No edema.    EKG:  EKG Interpretation Date/Time:  Friday January 19 2024 09:00:48 EDT Ventricular Rate:  58 PR Interval:  164 QRS Duration:  98 QT Interval:  404 QTC Calculation: 396 R Axis:   78  Text Interpretation: EKG 01/19/2024: Normal sinus rhythm with rate of 58 bpm, normal axis, IRBBB.  LVH with repolarization abnormality, cannot exclude lateral ischemia. Compared to 08/09/2023, T wave inversion in lateral leads is new. Confirmed by Maxson Oddo, Jagadeesh ([id number]) on 01/19/2024 9:27:38 AM    ASSESSMENT AND PLAN: .      ICD-10-CM   1. Nonspecific abnormal electrocardiogram (ECG) (EKG)  R94.31 Cardiac Stress Test: Informed Consent Details:  Physician/Practitioner Attestation; Transcribe to consent form and obtain patient signature    2. Coronary artery disease due to calcified coronary lesion  I25.10 Cardiac Stress Test: Informed Consent Details: Physician/Practitioner Attestation; Transcribe to consent form and obtain patient signature   I25.84 NM PET CT CARDIAC PERFUSION MULTI W/ABSOLUTE BLOODFLOW    Basic metabolic panel with GFR    Basic metabolic panel with GFR    3. Paroxysmal atrial fibrillation (HCC)  I48.0 EKG 12-Lead    4. Primary hypertension  I10     5. Hyperlipidemia associated with type 2 diabetes mellitus (HCC)  E11.69    E78.5     6. Pre-procedure lab exam  [phone] Basic metabolic panel with GFR    Basic metabolic panel with GFR      Assessment and Plan Assessment & Plan Essential hypertension with left ventricular hypertrophy and abnormal EKG Hypertension with evidence of left ventricular hypertrophy and abnormal EKG showing T wave inversions, likely due to hypertension. Blood pressure readings at home are generally low in the morning, leading to reduced dosing of olmesartan . EKG changes are new compared to previous normal EKGs. No symptoms of chest pain or shortness of breath reported. - Increase olmesartan  to 20 mg daily and take at night. - Monitor blood pressure multiple times a day for the next two weeks. - Order stress test to assess for potential cardiac blockages especially with new abnormal EKG and uncontrolled DM with markedly elevated coronary calcium score and calcific CAD. - Re-evaluate EKG changes in context of blood pressure control. - Lipids excellent control.   Coronary artery calcification in the context of atherosclerotic heart disease High coronary calcium score with no current symptoms of ischemia. Regular exercise tolerance is good, with no chest pain or shortness of breath during physical activity. Current EKG changes are not suspected to be due to coronary blockages. - Order stress  test to evaluate for potential coronary blockages.  Paroxysmal atrial fibrillation, status post ablation No recent episodes of atrial fibrillation reported since ablation. Continuous monitoring with Apple Watch shows low incidence of arrhythmia.  Type 2 diabetes mellitus with suboptimal glycemic control Suboptimal glycemic control with recent blood glucose readings elevated to 143 mg/dL. Previous trials of GLP-1 receptor agonists (Ozempic, Rybelsus, Trulicity) were not tolerated due to significant side effects including nausea and weight loss. Current regimen includes Jardiance  and Tradjenta , which are well-tolerated. Discussion about potential switch from Jardiance  to Farxiga  due to insurance coverage and possible efficacy differences. - Discuss with primary care provider about switching from Jardiance  to Farxiga  to see if it would make a difference in DM. Consider insulin pump.   Follow up: 12 months  Signed,  Gordy Bergamo, MD, [facility] 01/20/2024, 6:47 AM [facility] [address] [place], [place] [address] Phone: [phone]. Fax:  [phone]

## 2024-01-19 NOTE — Patient Instructions (Signed)
 Medication Instructions:  Your physician recommends that you continue on your current medications as directed. Please refer to the Current Medication list given to you today.  *If you need a refill on your cardiac medications before your next appointment, please call your pharmacy*  Lab Work: None ordered.  If you have labs (blood work) drawn today and your tests are completely normal, you will receive your results only by: MyChart Message (if you have MyChart) OR A paper copy in the mail If you have any lab test that is abnormal or we need to change your treatment, we will call you to review the results.  Testing/Procedures:    Please report to Radiology at the [facility] Main Entrance 30 minutes early for your test.  [address] [place], [place] [address]   How to Prepare for Your Cardiac PET/CT Stress Test:  Nothing to eat or drink, except water, 3 hours prior to arrival time.  NO caffeine/decaffeinated products, or chocolate 12 hours prior to arrival. (Please note decaffeinated beverages (teas/coffees) still contain caffeine).  If you have caffeine within 12 hours prior, the test will need to be rescheduled.  Medication instructions: Do not take erectile dysfunction medications for 72 hours prior to test (sildenafil , tadalafil) Do not take nitrates (isosorbide mononitrate, Ranexa) the day before or day of test Do not take tamsulosin the day before or morning of test Hold theophylline containing medications for 12 hours. Hold Dipyridamole 48 hours prior to the test.  Diabetic Preparation: If able to eat breakfast prior to 3 hour fasting, you may take all medications, including your insulin. Do not worry if you miss your breakfast dose of insulin - start at your next meal. If you do not eat prior to 3 hour fast-Hold all diabetes (oral and insulin) medications. Patients who wear a continuous glucose monitor MUST remove the device prior to scanning.  You may take  your remaining medications with water.  NO perfume, cologne or lotion on chest or abdomen area. FEMALES - Please avoid wearing dresses to this appointment.  Total time is 1 to 2 hours; you may want to bring reading material for the waiting time.  IF YOU THINK YOU MAY BE PREGNANT, OR ARE NURSING PLEASE INFORM THE TECHNOLOGIST.  In preparation for your appointment, medication and supplies will be purchased.  Appointment availability is limited, so if you need to cancel or reschedule, please call the Radiology Department Scheduler at [phone] 24 hours in advance to avoid a cancellation fee of $100.00  What to Expect When you Arrive:  Once you arrive and check in for your appointment, you will be taken to a preparation room within the Radiology Department.  A technologist or Nurse will obtain your medical history, verify that you are correctly prepped for the exam, and explain the procedure.  Afterwards, an IV will be started in your arm and electrodes will be placed on your skin for EKG monitoring during the stress portion of the exam. Then you will be escorted to the PET/CT scanner.  There, staff will get you positioned on the scanner and obtain a blood pressure and EKG.  During the exam, you will continue to be connected to the EKG and blood pressure machines.  A small, safe amount of a radioactive tracer will be injected in your IV to obtain a series of pictures of your heart along with an injection of a stress agent.    After your Exam:  It is recommended that you eat  a meal and drink a caffeinated beverage to counter act any effects of the stress agent.  Drink plenty of fluids for the remainder of the day and urinate frequently for the first couple of hours after the exam.  Your doctor will inform you of your test results within 7-10 business days.  For more information and frequently asked questions, please visit our website: [email or web address]  For questions about your  test or how to prepare for your test, please call: Cardiac Imaging Nurse Navigators Office: [phone]    Follow-Up: At [facility], you and your health needs are our priority.  As part of our continuing mission to provide you with exceptional heart care, our providers are all part of one team.  This team includes your primary Cardiologist (physician) and Advanced Practice Providers or APPs (Physician Assistants and Nurse Practitioners) who all work together to provide you with the care you need, when you need it.  Your next appointment:   12 months with Dr Ganji

## 2024-01-24 LAB — BASIC METABOLIC PANEL WITH GFR
BUN/Creatinine Ratio: 12 (ref 10–24)
BUN: 10 mg/dL (ref 8–27)
CO2: 19 mmol/L — ABNORMAL LOW (ref 20–29)
Calcium: 9.1 mg/dL (ref 8.6–10.2)
Chloride: 100 mmol/L (ref 96–106)
Creatinine, Ser: 0.81 mg/dL (ref 0.76–1.27)
Glucose: 138 mg/dL — ABNORMAL HIGH (ref 70–99)
Potassium: 4.2 mmol/L (ref 3.5–5.2)
Sodium: 138 mmol/L (ref 134–144)
eGFR: 100 mL/min/1.73 (ref >59)

## 2024-01-25 ENCOUNTER — Ambulatory Visit: Payer: Self-pay | Admitting: Cardiology

## 2024-01-25 NOTE — Progress Notes (Signed)
 Normal kidney function and potassium levels. Okay to resume all your dedications as discussed

## 2024-02-05 ENCOUNTER — Emergency Department (HOSPITAL_BASED_OUTPATIENT_CLINIC_OR_DEPARTMENT_OTHER)
Admission: EM | Admit: 2024-02-05 | Discharge: 2024-02-05 | Disposition: A | Discharge status: Home / Self Care | Attending: Emergency Medicine | Admitting: Emergency Medicine

## 2024-02-05 ENCOUNTER — Other Ambulatory Visit: Payer: Self-pay

## 2024-02-05 ENCOUNTER — Emergency Department (HOSPITAL_BASED_OUTPATIENT_CLINIC_OR_DEPARTMENT_OTHER)

## 2024-02-05 ENCOUNTER — Encounter (HOSPITAL_BASED_OUTPATIENT_CLINIC_OR_DEPARTMENT_OTHER): Payer: Self-pay

## 2024-02-05 DIAGNOSIS — E039 Hypothyroidism, unspecified: Secondary | ICD-10-CM | POA: Insufficient documentation

## 2024-02-05 DIAGNOSIS — I4892 Unspecified atrial flutter: Secondary | ICD-10-CM | POA: Insufficient documentation

## 2024-02-05 DIAGNOSIS — Z7901 Long term (current) use of anticoagulants: Secondary | ICD-10-CM | POA: Insufficient documentation

## 2024-02-05 DIAGNOSIS — R000 Tachycardia, unspecified: Secondary | ICD-10-CM | POA: Diagnosis present

## 2024-02-05 DIAGNOSIS — Z79899 Other long term (current) drug therapy: Secondary | ICD-10-CM | POA: Insufficient documentation

## 2024-02-05 DIAGNOSIS — I10 Essential (primary) hypertension: Secondary | ICD-10-CM | POA: Diagnosis not present

## 2024-02-05 LAB — BASIC METABOLIC PANEL WITH GFR
Anion gap: 14 (ref 5–15)
BUN: 12 mg/dL (ref 8–23)
CO2: 22 mmol/L (ref 22–32)
Calcium: 9.6 mg/dL (ref 8.9–10.3)
Chloride: 99 mmol/L (ref 98–111)
Creatinine, Ser: 0.82 mg/dL (ref 0.61–1.24)
GFR, Estimated: >60 mL/min (ref >60)
Glucose, Bld: 183 mg/dL — ABNORMAL HIGH (ref 70–99)
Potassium: 4.3 mmol/L (ref 3.5–5.1)
Sodium: 136 mmol/L (ref 135–145)

## 2024-02-05 LAB — CBC
HCT: 44.5 % (ref 39.0–52.0)
Hemoglobin: 15.5 g/dL (ref 13.0–17.0)
MCH: 30.2 pg (ref 26.0–34.0)
MCHC: 34.8 g/dL (ref 30.0–36.0)
MCV: 86.6 fL (ref 80.0–100.0)
Platelets: 147 K/uL — ABNORMAL LOW (ref 150–400)
RBC: 5.14 MIL/uL (ref 4.22–5.81)
RDW: 12.9 % (ref 11.5–15.5)
WBC: 6.3 K/uL (ref 4.0–10.5)
nRBC: 0 % (ref 0.0–0.2)

## 2024-02-05 LAB — MAGNESIUM: Magnesium: 2 mg/dL (ref 1.7–2.4)

## 2024-02-05 LAB — TROPONIN T, HIGH SENSITIVITY: Troponin T High Sensitivity: 17 ng/L (ref 0–19)

## 2024-02-05 MED ORDER — ONDANSETRON HCL 4 MG/2ML IJ SOLN
4.0000 mg | Freq: Once | INTRAMUSCULAR | Status: AC
  Administered 2024-02-05: 4 mg via INTRAVENOUS
  Filled 2024-02-05: qty 2

## 2024-02-05 MED ORDER — ETOMIDATE 2 MG/ML IV SOLN
10.0000 mg | Freq: Once | INTRAVENOUS | Status: DC
  Filled 2024-02-05: qty 10

## 2024-02-05 MED ORDER — ETOMIDATE 2 MG/ML IV SOLN
INTRAVENOUS | Status: AC | PRN
  Administered 2024-02-05: 10 mg via INTRAVENOUS

## 2024-02-05 MED ORDER — LACTATED RINGERS IV BOLUS
1000.0000 mL | Freq: Once | INTRAVENOUS | Status: AC
  Administered 2024-02-05: 1000 mL via INTRAVENOUS

## 2024-02-05 MED ORDER — FENTANYL CITRATE (PF) 50 MCG/ML IJ SOSY
25.0000 ug | PREFILLED_SYRINGE | Freq: Once | INTRAMUSCULAR | Status: AC
  Administered 2024-02-05: 25 ug via INTRAVENOUS
  Filled 2024-02-05: qty 1

## 2024-02-05 NOTE — ED Provider Notes (Signed)
 [place] EMERGENCY DEPARTMENT AT [facility] Provider Note   CSN: [id number] Arrival date & time: 02/05/24  9152     Patient presents with: Tachycardia   William Nixon is a 63 y.o. male.   HPI   63 year old male HTN, HLD, hypothyroidism, atrial fibrillation status post cardioversion on Eliquis  presenting to the emergency department with an elevated heart rate.  The patient states that he has a history of atrial fibrillation and underwent cardioversion in 2023 and had not had any problems with his heart rate or rhythm since.  He is not on any rate control medication.  He denies any chest pain, shortness of breath, heart palpitations.  He only noted his elevated heart rate when he was taking his blood pressure this morning.  Denies any nausea, vomiting, diaphoresis, abdominal pain.  No fever or chills.  Prior to Admission medications   Medication Sig Start Date End Date Taking? Authorizing Provider  acarbose  (PRECOSE ) 25 MG tablet Take 1 tablet (25 mg total) by mouth 3 (three) times daily with meals. Patient taking differently: Take 25 mg by mouth at bedtime. 09/25/23   Trixie File, MD  apixaban  (ELIQUIS ) 5 MG TABS tablet Take 1 tablet (5 mg total) by mouth 2 (two) times daily. 12/11/23   Ladona Heinz, MD  Ascorbic Acid (VITAMIN C PO) Take 1 tablet by mouth daily.    [provider]  Boswellia-Glucosamine-Vit D (OSTEO BI-FLEX ONE PER DAY PO) Take 1 tablet by mouth daily.    [provider]  empagliflozin  (JARDIANCE ) 25 MG TABS tablet Take 1 tablet (25 mg total) by mouth daily before breakfast. 01/20/23   Trixie File, MD  glipiZIDE  (GLUCOTROL  XL) 5 MG 24 hr tablet Take 2 tablets (10 mg total) by mouth daily with breakfast. 05/16/23   Trixie File, MD  glipiZIDE  (GLUCOTROL ) 5 MG tablet TAKE 1 TABLET BY MOUTH DAILY BEFORE BREAKFAST 10/09/23   Trixie File, MD  metFORMIN  (GLUCOPHAGE ) 1000 MG tablet TAKE 1 TABLET BY MOUTH 2 TIMES A DAY 06/01/23    Trixie File, MD  Multiple Vitamin (MULTIVITAMIN WITH MINERALS) TABS Take 1 tablet by mouth daily. Centrum    [provider]  olmesartan  (BENICAR ) 20 MG tablet TAKE ONE TABLET BY MOUTH EVERY EVENING 05/04/23   Ladona Heinz, MD  OneTouch Delica Lancets 33G MISC Use to check blood sugar 3 times a day. 07/05/18   Trixie File, MD  [facility] VERIO test strip USE TO TEST THREE TIMES A DAY 09/04/23   Trixie File, MD  rosuvastatin (CRESTOR) 10 MG tablet Take 10 mg by mouth at bedtime. 01/23/19   [provider]  TRADJENTA  5 MG TABS tablet Take 1 tablet (5 mg total) by mouth daily. 05/16/23   Trixie File, MD    Allergies: Bovine (beef) protein-containing drug products, Dronedarone , and Dulaglutide    Review of Systems  All other systems reviewed and are negative.   Updated Vital Signs BP 128/70 (BP Location: Left Arm)   Pulse 88   Temp 97.9 F (36.6 C) (Oral)   Resp 18   Ht 5' 8 (1.727 m)   Wt 68.5 kg   SpO2 100%   BMI 22.96 kg/m   Physical Exam Vitals and nursing note reviewed.  Constitutional:      General: He is not in acute distress.    Appearance: He is well-developed.  HENT:     Head: Normocephalic and atraumatic.  Eyes:     Conjunctiva/sclera: Conjunctivae normal.  Cardiovascular:  Rate and Rhythm: Regular rhythm. Tachycardia present.     Heart sounds: No murmur heard. Pulmonary:     Effort: Pulmonary effort is normal. No respiratory distress.     Breath sounds: Normal breath sounds.  Abdominal:     Palpations: Abdomen is soft.     Tenderness: There is no abdominal tenderness.  Musculoskeletal:        General: No swelling.     Cervical back: Neck supple.  Skin:    General: Skin is warm and dry.     Capillary Refill: Capillary refill takes less than 2 seconds.  Neurological:     Mental Status: He is alert.  Psychiatric:        Mood and Affect: Mood normal.     (all labs ordered are listed, but only abnormal results are  displayed) Labs Reviewed  BASIC METABOLIC PANEL WITH GFR - Abnormal; Notable for the following components:      Result Value   Glucose, Bld 183 (*)    All other components within normal limits  CBC - Abnormal; Notable for the following components:   Platelets 147 (*)    All other components within normal limits  MAGNESIUM  TROPONIN T, HIGH SENSITIVITY    EKG: EKG Interpretation Date/Time:  Monday February 05 2024 08:58:44 EDT Ventricular Rate:  160 PR Interval:    QRS Duration:  96 QT Interval:  306 QTC Calculation: 499 R Axis:   91  Text Interpretation: Atrial flutter with variable A-V block Rightward axis Incomplete right bundle branch block Nonspecific ST and T wave abnormality Abnormal ECG When compared with ECG of 19-Jan-2024 09:00, Significant changes have occurred Confirmed by Jerrol Agent ([id number]) on 02/05/2024 9:08:13 AM  Radiology: [facility] Chest Port 1 View Result Date: 02/05/2024 CLINICAL DATA:  Shortness of breath and tachycardia since this morning. EXAM: PORTABLE CHEST 1 VIEW COMPARISON:  None Available. FINDINGS: The heart size and mediastinal contours are within normal limits. Both lungs are clear. The visualized skeletal structures are unremarkable. IMPRESSION: No active disease. Electronically Signed   By: Rockey Kilts M.D.   On: 02/05/2024 10:28     .Cardioversion  Date/Time: 02/05/2024 10:08 AM  Performed by: Jerrol Agent, MD Authorized by: Jerrol Agent, MD   Consent:    Consent obtained:  Written   Consent given by:  Patient   Risks discussed:  Cutaneous burn, death, induced arrhythmia and pain Pre-procedure details:    Cardioversion basis:  Emergent   Rhythm:  Atrial flutter   Electrode placement:  Anterior-posterior Patient sedated: Yes. Refer to sedation procedure documentation for details of sedation.  Attempt one:    Cardioversion mode:  Synchronous   Waveform:  Biphasic   Shock (Joules):  50   Shock outcome:  No change in rhythm Attempt two:     Cardioversion mode:  Synchronous   Waveform:  Biphasic   Shock (Joules):  100   Shock outcome:  Conversion to normal sinus rhythm Post-procedure details:    Patient status:  Awake   Patient tolerance of procedure:  Tolerated well, no immediate complications .Sedation  Date/Time: 02/05/2024 10:00 AM  Performed by: Jerrol Agent, MD Authorized by: Jerrol Agent, MD   Universal protocol:    Immediately prior to procedure, a time out was called: yes     Patient identity confirmed:  Arm band and verbally with patient Indications:    Procedure performed:  Cardioversion   Procedure necessitating sedation performed by:  Physician performing sedation Pre-sedation assessment:    Time  since last food or drink:  12   ASA classification: class 2 - patient with mild systemic disease     Mouth opening:  3 or more finger widths   Thyromental distance:  3 finger widths   Mallampati score:  I - soft palate, uvula, fauces, pillars visible   Neck mobility: normal     Pre-sedation assessments completed and reviewed: airway patency, cardiovascular function, hydration status, mental status, nausea/vomiting, pain level, respiratory function and temperature   A pre-sedation assessment was completed prior to the start of the procedure Immediate pre-procedure details:    Reassessment: Patient reassessed immediately prior to procedure     Reviewed: vital signs, relevant labs/tests and NPO status     Verified: bag valve mask available, emergency equipment available, intubation equipment available, IV patency confirmed, oxygen available, reversal medications available and suction available   Procedure details (see MAR for exact dosages):    Preoxygenation:  Nasal cannula   Sedation:  Etomidate   Intended level of sedation: deep   Analgesia:  Fentanyl    Intra-procedure monitoring:  Blood pressure monitoring, cardiac monitor, continuous capnometry, continuous pulse oximetry, frequent LOC assessments and  frequent vital sign checks   Intra-procedure events: none     Total Provider sedation time (minutes):  10 Post-procedure details:   A post-sedation assessment was completed following the completion of the procedure.   Attendance: Constant attendance by certified staff until patient recovered     Recovery: Patient returned to pre-procedure baseline     Post-sedation assessments completed and reviewed: airway patency, cardiovascular function, hydration status, mental status, nausea/vomiting, pain level, respiratory function and temperature     Patient is stable for discharge or admission: yes     Procedure completion:  Tolerated well, no immediate complications .Critical Care  Performed by: Jerrol Agent, MD Authorized by: Jerrol Agent, MD   Critical care provider statement:    Critical care time (minutes):  30   Critical care was time spent personally by me on the following activities:  Development of treatment plan with patient or surrogate, discussions with consultants, evaluation of patient's response to treatment, examination of patient, ordering and review of laboratory studies, ordering and review of radiographic studies, ordering and performing treatments and interventions, pulse oximetry, re-evaluation of patient's condition and review of old charts   EKG S/P 50J Synchronized cardioversion:     EKG S/P 100J synchronized cardioversion:   Medications Ordered in the ED  etomidate (AMIDATE) injection 10 mg ( Intravenous Canceled Entry 02/05/24 1025)  lactated ringers bolus 1,000 mL (0 mLs Intravenous Stopped 02/05/24 1033)  ondansetron  (ZOFRAN ) injection 4 mg (4 mg Intravenous Given 02/05/24 0944)  fentaNYL  (SUBLIMAZE ) injection 25 mcg (25 mcg Intravenous Given 02/05/24 0953)  etomidate (AMIDATE) injection (10 mg Intravenous Given 02/05/24 0954)                                    Medical Decision Making Amount and/or Complexity of Data Reviewed Labs: ordered. Radiology:  ordered.  Risk Prescription drug management.    63 year old male HTN, HLD, hypothyroidism, atrial fibrillation status post cardioversion on Eliquis  presenting to the emergency department with an elevated heart rate.  The patient states that he has a history of atrial fibrillation and underwent cardioversion in 2023 and had not had any problems with his heart rate or rhythm since.  He is not on any rate control medication.  He denies any chest pain,  shortness of breath, heart palpitations.  He only noted his elevated heart rate when he was taking his blood pressure this morning.  Denies any nausea, vomiting, diaphoresis, abdominal pain.  No fever or chills.  On arrival, the patient was noted to be tachycardic, rate 155 and regular, atrial flutter noted on cardiac telemetry and confirmed on initial EKG, rate 160.   Chest x-ray was performed and revealed no active disease.  Laboratory evaluation revealed normal cardiac troponin at 17, magnesium normal, BMP with only mild hyperglycemia to 183, no electrolyte abnormality, no acidosis, CBC without a leukocytosis or anemia.  The patient was administered an IV fluid bolus.  I spoke with Dr. Jesus of on-call cardiology who reviewed the patient's EKG and agreed with plan for emergent cardioversion in the emergency department.  The patient was consented for emergent cardioversion which was performed as per the procedure note above with satisfactory return to normal sinus rhythm on second shock of 100 J.  The patient was observed status post cardioversion and maintained hemodynamic stability, had returned to mental baseline, was notably tolerating p.o.  and vitally stable.  Plan for follow-up in the atrial fibrillation clinic on 10/23 which was scheduled by cardiology.  Patient advised to continue with his outpatient Eliquis , return for any significant worsening symptoms, overall stable for discharge at this time.     Final diagnoses:  Atrial flutter with  rapid ventricular response [facility])    ED Discharge Orders          Ordered    Amb Referral to AFIB Clinic        02/05/24 1016               Jerrol Agent, MD 02/05/24 1046

## 2024-02-05 NOTE — Sedation Documentation (Signed)
EKG repeated

## 2024-02-05 NOTE — Discharge Instructions (Addendum)
 You are found to be in atrial flutter with rapid ventricular response and were emergently cardioverted in the emergency department.  You are converted to sinus rhythm.  You have scheduled follow-up now with the atrial fibrillation clinic on 10/23 at 9:30 AM.  Please continue to take your home Eliquis  and follow-up with with the cardiology department.  Return to the Emergency Department for any recurrence of symptoms, chest pain, shortness of breath or uncontrolled heart rate.

## 2024-02-05 NOTE — Sedation Documentation (Signed)
 EKG repeated. NSR on monitor. Dr. Jerrol ended case.

## 2024-02-05 NOTE — Sedation Documentation (Signed)
 100 J shock delivered by Dr. Jerrol

## 2024-02-05 NOTE — Sedation Documentation (Signed)
 50 J shocked delivered by Dr. Jerrol

## 2024-02-05 NOTE — Progress Notes (Signed)
 [facility]el[facility] Therapist at [facility]  in room number ___7_ during procedure.  Suction with [place] at [facility] set up and ready to use. Ambu bag at [facility] and ready to use.  Patient placed on ETCO2 Nasal Cannula at __2__ LPM.  Vitals at conclusion of procedure:  ETCO2 __32__ mmHg HR 155 RR 16 SPO2 100%  Patient awake and able to verbalize name.

## 2024-02-06 ENCOUNTER — Telehealth (HOSPITAL_COMMUNITY): Payer: Self-pay | Admitting: Emergency Medicine

## 2024-02-06 NOTE — Telephone Encounter (Signed)
 Reaching out to patient to offer assistance regarding upcoming cardiac imaging study; pt verbalizes understanding of appt date/time, parking situation and where to check in, pre-test NPO status and medications ordered, and verified current allergies; name and call back number provided for further questions should they arise Rockwell Alexandria RN Navigator Cardiac Imaging [facility] [phone] office [phone] cell

## 2024-02-07 ENCOUNTER — Other Ambulatory Visit: Payer: Self-pay | Admitting: Internal Medicine

## 2024-02-07 ENCOUNTER — Ambulatory Visit (HOSPITAL_COMMUNITY)
Admission: RE | Admit: 2024-02-07 | Discharge: 2024-02-07 | Disposition: A | Discharge status: Home / Self Care | Source: Ambulatory Visit | Attending: Cardiology | Admitting: Cardiology

## 2024-02-07 DIAGNOSIS — I2510 Atherosclerotic heart disease of native coronary artery without angina pectoris: Secondary | ICD-10-CM | POA: Diagnosis present

## 2024-02-07 DIAGNOSIS — I2584 Coronary atherosclerosis due to calcified coronary lesion: Secondary | ICD-10-CM | POA: Insufficient documentation

## 2024-02-07 LAB — NM PET CT CARDIAC PERFUSION MULTI W/ABSOLUTE BLOODFLOW
MBFR: 2.59
Rest MBF: 1.06 ml/g/min
Rest Nuclear Isotope Dose: 17.7 mCi
ST Depression (mm): 0 mm
Stress MBF: 2.75 ml/g/min
Stress Nuclear Isotope Dose: 18 mCi
TID: 1.02

## 2024-02-07 MED ORDER — RUBIDIUM RB82 GENERATOR (RUBYFILL)
17.7000 | PACK | Freq: Once | INTRAVENOUS | Status: AC
  Administered 2024-02-07: 17.7 via INTRAVENOUS

## 2024-02-07 MED ORDER — REGADENOSON 0.4 MG/5ML IV SOLN
INTRAVENOUS | Status: AC
  Filled 2024-02-07: qty 5

## 2024-02-07 MED ORDER — RUBIDIUM RB82 GENERATOR (RUBYFILL)
17.9500 | PACK | Freq: Once | INTRAVENOUS | Status: AC
  Administered 2024-02-07: 17.95 via INTRAVENOUS

## 2024-02-07 MED ORDER — REGADENOSON 0.4 MG/5ML IV SOLN
0.4000 mg | Freq: Once | INTRAVENOUS | Status: AC
  Administered 2024-02-07: 0.4 mg via INTRAVENOUS

## 2024-02-07 NOTE — Progress Notes (Signed)
 Mildly abnormal stress showing ischemia in the mid to distal LAD.  Normal heart function and overall low risk, and as you were able tolerate A. Fib and exercise regularly, we do not think you need heart catheterization. Continue present meds for now

## 2024-02-08 ENCOUNTER — Emergency Department (INDEPENDENT_AMBULATORY_CARE_PROVIDER_SITE_OTHER)
Admit: 2024-02-08 | Discharge: 2024-02-08 | Disposition: A | Discharge status: Home / Self Care | Attending: Internal Medicine | Admitting: Internal Medicine

## 2024-02-08 ENCOUNTER — Encounter (HOSPITAL_COMMUNITY): Payer: Self-pay | Admitting: Internal Medicine

## 2024-02-08 VITALS — BP 154/78 | HR 70 | Ht 68.0 in | Wt 148.4 lb

## 2024-02-08 DIAGNOSIS — D6869 Other thrombophilia: Secondary | ICD-10-CM

## 2024-02-08 DIAGNOSIS — I480 Paroxysmal atrial fibrillation: Secondary | ICD-10-CM | POA: Diagnosis not present

## 2024-02-08 MED ORDER — METOPROLOL TARTRATE 25 MG PO TABS: 25.0000 mg | ORAL_TABLET | Freq: Two times a day (BID) | ORAL | 3 refills | Status: DC | PRN

## 2024-02-08 NOTE — Progress Notes (Addendum)
 Primary Care Physician: Avva, Ravisankar, MD Referring Physician: Dr. Inocencio Divers William Nixon is a 63 y.o. male with a h/o afib that is in the afib clinic for f/u ablation performed 03/31/22 by Dr. Inocencio. Pt has done well without any s/s of afib since procedure. No swallowing or groin issues. History of being on Multaq . Complaint with anticoagulation.  On follow up 02/08/24, patient is currently in NSR. S/p DCCV in ED on 10/20 for atrial flutter with RVR. This is his first episode in ~2 years since ablation. Patient notes he believes the trigger was an unhooked CPAP machine as this took place the night before episode. He is a very active individual and checks his BP daily. His BP is elevated today but at home reads normal. No missed doses of Eliquis .   Today, he denies symptoms of palpitations, chest pain, shortness of breath, orthopnea, PND, lower extremity edema, dizziness, presyncope, syncope, or neurologic sequela. The patient is tolerating medications without difficulties and is otherwise without complaint today.   Past Medical History:  Diagnosis Date   Diabetes mellitus    type II   Hyperlipidemia    pt. states that hdl ldl was elevated denies hyperlipidemia   Hypertension    Hyperthyroidism    pt. denies having any thyroid  issues   Paroxysmal atrial fibrillation (HCC)    Personal history of colonic polyps - adenomas 08/06/2013   07/2013 - 3 diminutive adenomas - repeat colonoscopy 2018   Right hydrocele    S/P ACL repair    right    Sleep apnea    C PAP   Past Surgical History:  Procedure Laterality Date   ATRIAL FIBRILLATION ABLATION N/A 03/31/2022   Procedure: ATRIAL FIBRILLATION ABLATION;  Surgeon: Inocencio Soyla Lunger, MD;  Location: [facility] INVASIVE CV LAB;  Service: Cardiovascular;  Laterality: N/A;   COLONOSCOPY     KNEE SURGERY  2002   right    Current Outpatient Medications  Medication Sig Dispense Refill   acarbose  (PRECOSE ) 25 MG tablet Take 1 tablet  (25 mg total) by mouth 3 (three) times daily with meals. (Patient taking differently: Take 25 mg by mouth at bedtime.) 90 tablet 5   apixaban  (ELIQUIS ) 5 MG TABS tablet Take 1 tablet (5 mg total) by mouth 2 (two) times daily. 60 tablet 5   Ascorbic Acid (VITAMIN C PO) Take 1 tablet by mouth daily.     Boswellia-Glucosamine-Vit D (OSTEO BI-FLEX ONE PER DAY PO) Take 1 tablet by mouth daily.     empagliflozin  (JARDIANCE ) 25 MG TABS tablet Take 1 tablet (25 mg total) by mouth daily before breakfast. 90 tablet 3   glipiZIDE  (GLUCOTROL  XL) 5 MG 24 hr tablet Take 2 tablets (10 mg total) by mouth daily with breakfast. 180 tablet 3   glipiZIDE  (GLUCOTROL ) 5 MG tablet TAKE 1 TABLET BY MOUTH DAILY BEFORE BREAKFAST 30 tablet 5   glucose blood (ONETOUCH VERIO) test strip Use to check blood sugar 3 times a day 300 strip 3   metFORMIN  (GLUCOPHAGE ) 1000 MG tablet TAKE 1 TABLET BY MOUTH 2 TIMES A DAY 60 tablet 11   metoprolol  tartrate (LOPRESSOR ) 25 MG tablet Take 1 tablet (25 mg total) by mouth 2 (two) times daily as needed. For elevated HR and SBP above 100 30 tablet 3   Multiple Vitamin (MULTIVITAMIN WITH MINERALS) TABS Take 1 tablet by mouth daily. Centrum     olmesartan  (BENICAR ) 20 MG tablet TAKE ONE TABLET BY MOUTH EVERY EVENING 90  tablet 2   OneTouch Delica Lancets 33G MISC Use to check blood sugar 3 times a day. 300 each 12   rosuvastatin (CRESTOR) 10 MG tablet Take 10 mg by mouth at bedtime.     TRADJENTA  5 MG TABS tablet Take 1 tablet (5 mg total) by mouth daily. 90 tablet 3   No current facility-administered medications for this encounter.    Allergies  Allergen Reactions   Bovine (Beef) Protein-Containing Drug Products     Cultural    Dronedarone      Other Reaction(s): elevated liver transaminases   Dulaglutide Nausea And Vomiting   Semaglutide     Other Reaction(s): GI side effects nausea   ROS- All systems are reviewed and negative except as per the HPI above  Physical Exam: Vitals:    02/08/24 0918  BP: (!) 154/78  Pulse: 70  Weight: 67.3 kg  Height: 5' 8 (1.727 m)    Wt Readings from Last 3 Encounters:  02/08/24 67.3 kg  02/05/24 68.5 kg  01/19/24 68.9 kg    Labs: Lab Results  Component Value Date   NA 136 02/05/2024   K 4.3 02/05/2024   CL 99 02/05/2024   CO2 22 02/05/2024   GLUCOSE 183 (H) 02/05/2024   BUN 12 02/05/2024   CREATININE 0.82 02/05/2024   CALCIUM 9.6 02/05/2024   MG 2.0 02/05/2024   No results found for: INR Lab Results  Component Value Date   CHOL 163 01/16/2018   HDL 27.00 (L) 01/16/2018   LDLCALC 103 (H) 01/16/2018   TRIG 165.0 (H) 01/16/2018   GEN- The patient is well appearing, alert and oriented x 3 today.   Neck - no JVD or carotid bruit noted Lungs- Clear to ausculation bilaterally, normal work of breathing Heart- Regular rate and rhythm, no murmurs, rubs or gallops, PMI not laterally displaced Extremities- no clubbing, cyanosis, or edema Skin - no rash or ecchymosis noted   EKG- Vent. rate 70 BPM PR interval 158 ms QRS duration 104 ms QT/QTcB 388/419 ms P-R-T axes 71 84 49 Normal sinus rhythm with sinus arrhythmia Incomplete right bundle branch block Minimal voltage criteria for LVH, may be normal variant ( Sokolow-Lyon ) Nonspecific ST and T wave abnormality Abnormal ECG When compared with ECG of 05-Feb-2024 08:58, NSR has replaced atrial flutter  ECHO 07/22/21: Normal LV systolic function with visual EF 60-65%. Left ventricle cavity is normal in size. Mild concentric hypertrophy of the left ventricle. Normal global wall motion. Unable to evaluate diastolic function due to underlying rhythm; but filling pattern suggestive of normal LAP. No significant valvular heart disease. Compared to study 04/09/2019 no significant change.   CHA2DS2-VASc Score = 3  The patient's score is based upon: CHF History: 0 HTN History: 1 Diabetes History: 1 Stroke History: 0 Vascular Disease History: 1 Age Score: 0 Gender  Score: 0      ASSESSMENT AND PLAN: Paroxysmal Atrial Fibrillation / flutter (ICD10:  I48.0) The patient's CHA2DS2-VASc score is 3, indicating a 3.2% annual risk of stroke.   History of Afib ablation on 03/31/22 by Dr. Inocencio. S/p DCCV in ED on 02/05/24.  Patient is currently in NSR. Will continue without current changes at this time due to likely trigger being patient accidentally not using his CPAP machine the night prior. He wears an Apple watch so if elevated HR is noted again in the future we did discuss possible repeat ablation. Will prescribe Lopressor  PRN.   Secondary Hypercoagulable State (ICD10:  D4709188) The patient is  at significant risk for stroke/thromboembolism based upon his CHA2DS2-VASc Score of 3.  Continue Apixaban  (Eliquis ).  Continue Eliquis  without interruption.    Follow up in Afib clinic in 3 months for reassessment.    Dorn Heinrich, PA-C Afib Clinic [facility] [address] [place], [place] [address] [phone]

## 2024-02-08 NOTE — Patient Instructions (Addendum)
 May take   metoprolol  tartrate (LOPRESSOR  25 mg twice a day as needed for elevated heart rate and blood pressure above 100

## 2024-02-14 ENCOUNTER — Other Ambulatory Visit: Payer: Self-pay | Admitting: Internal Medicine

## 2024-02-14 ENCOUNTER — Telehealth (HOSPITAL_COMMUNITY): Payer: Self-pay | Admitting: *Deleted

## 2024-02-14 NOTE — Telephone Encounter (Signed)
 Pt called reporting Elevated HR 153-160 Afib/flutter per watch device with BP 159/107 he just took metoprolol  25 mg PRN dose. Pt denies feeling any symptoms at this time. Pt will continue to monitor HR ,BP and any symptoms for the evening. Pt has not missed any Eliquis . Appt made to come to office in the morning. I reviewed ER precautions for tonight if needed. Pt is at home with wife. Pt verbalized understanding and agrees with plan.

## 2024-02-15 ENCOUNTER — Encounter (HOSPITAL_COMMUNITY): Payer: Self-pay | Admitting: Internal Medicine

## 2024-02-15 ENCOUNTER — Ambulatory Visit (HOSPITAL_COMMUNITY)
Admission: RE | Admit: 2024-02-15 | Discharge: 2024-02-15 | Disposition: A | Discharge status: Home / Self Care | Source: Ambulatory Visit | Attending: Internal Medicine | Admitting: Internal Medicine

## 2024-02-15 VITALS — BP 152/92 | HR 70 | Ht 68.0 in | Wt 149.0 lb

## 2024-02-15 DIAGNOSIS — I480 Paroxysmal atrial fibrillation: Secondary | ICD-10-CM | POA: Diagnosis not present

## 2024-02-15 DIAGNOSIS — D6869 Other thrombophilia: Secondary | ICD-10-CM | POA: Diagnosis not present

## 2024-02-15 MED ORDER — METOPROLOL TARTRATE 25 MG PO TABS: 25.0000 mg | ORAL_TABLET | Freq: Two times a day (BID) | ORAL | Status: DC

## 2024-02-15 MED ORDER — DRONEDARONE HCL 400 MG PO TABS
400.0000 mg | ORAL_TABLET | Freq: Two times a day (BID) | ORAL | 6 refills | Status: AC
Start: 2024-02-15 — End: ?

## 2024-02-15 NOTE — Progress Notes (Signed)
 Primary Care Physician: Avva, Ravisankar, MD Referring Physician: Dr. Inocencio Divers William Nixon is a 63 y.o. male with a h/o afib that is in the afib clinic for f/u ablation performed 03/31/22 by Dr. Inocencio. Pt has done well without any s/s of afib since procedure. No swallowing or groin issues. History of being on Multaq . Complaint with anticoagulation.  On follow up 02/08/24, patient is currently in NSR. S/p DCCV in ED on 10/20 for atrial flutter with RVR. This is his first episode in ~2 years since ablation. Patient notes he believes the trigger was an unhooked CPAP machine as this took place the night before episode. He is a very active individual and checks his BP daily. His BP is elevated today but at home reads normal. No missed doses of Eliquis .   On follow up 02/15/24, patient is currently in NSR. He contacted clinic yesterday noting his HR was in 150-160 range and took PRN metoprolol  dose. This time there was no clear trigger; he had been using CPAP without issue. He denied any symptoms. No missed doses of Eliquis . He is going out of country from 11/8-16.   Today, he denies symptoms of palpitations, chest pain, shortness of breath, orthopnea, PND, lower extremity edema, dizziness, presyncope, syncope, or neurologic sequela. The patient is tolerating medications without difficulties and is otherwise without complaint today.   Past Medical History:  Diagnosis Date   Diabetes mellitus    type II   Hyperlipidemia    pt. states that hdl ldl was elevated denies hyperlipidemia   Hypertension    Hyperthyroidism    pt. denies having any thyroid  issues   Paroxysmal atrial fibrillation (HCC)    Personal history of colonic polyps - adenomas 08/06/2013   07/2013 - 3 diminutive adenomas - repeat colonoscopy 2018   Right hydrocele    S/P ACL repair    right    Sleep apnea    C PAP   Past Surgical History:  Procedure Laterality Date   ATRIAL FIBRILLATION ABLATION N/A 03/31/2022    Procedure: ATRIAL FIBRILLATION ABLATION;  Surgeon: Inocencio Soyla Lunger, MD;  Location: [facility];  Service: Cardiovascular;  Laterality: N/A;   COLONOSCOPY     KNEE SURGERY  2002   right    Current Outpatient Medications  Medication Sig Dispense Refill   acarbose  (PRECOSE ) 25 MG tablet Take 1 tablet (25 mg total) by mouth 3 (three) times daily with meals. (Patient taking differently: Take 25 mg by mouth at bedtime.) 90 tablet 5   apixaban  (ELIQUIS ) 5 MG TABS tablet Take 1 tablet (5 mg total) by mouth 2 (two) times daily. 60 tablet 5   Ascorbic Acid (VITAMIN C PO) Take 1 tablet by mouth daily.     Boswellia-Glucosamine-Vit D (OSTEO BI-FLEX ONE PER DAY PO) Take 1 tablet by mouth daily.     empagliflozin  (JARDIANCE ) 25 MG TABS tablet TAKE 1 TABLET BY MOUTH DAILY BEFORE BREAKFAST 90 tablet 1   glipiZIDE  (GLUCOTROL  XL) 5 MG 24 hr tablet Take 2 tablets (10 mg total) by mouth daily with breakfast. 180 tablet 3   glipiZIDE  (GLUCOTROL ) 5 MG tablet TAKE 1 TABLET BY MOUTH DAILY BEFORE BREAKFAST 30 tablet 5   glucose blood (ONETOUCH VERIO) test strip Use to check blood sugar 3 times a day 300 strip 3   metFORMIN  (GLUCOPHAGE ) 1000 MG tablet TAKE 1 TABLET BY MOUTH 2 TIMES A DAY 60 tablet 11   metoprolol  tartrate (LOPRESSOR ) 25 MG tablet Take  1 tablet (25 mg total) by mouth 2 (two) times daily as needed. For elevated HR and SBP above 100 30 tablet 3   Multiple Vitamin (MULTIVITAMIN WITH MINERALS) TABS Take 1 tablet by mouth daily. Centrum     olmesartan  (BENICAR ) 20 MG tablet TAKE ONE TABLET BY MOUTH EVERY EVENING 90 tablet 2   OneTouch Delica Lancets 33G MISC Use to check blood sugar 3 times a day. 300 each 12   rosuvastatin (CRESTOR) 10 MG tablet Take 10 mg by mouth at bedtime.     TRADJENTA  5 MG TABS tablet Take 1 tablet (5 mg total) by mouth daily. 90 tablet 3   No current facility-administered medications for this visit.    Allergies  Allergen Reactions   Bovine (Beef) Protein-Containing  Drug Products     Cultural    Dronedarone      Other Reaction(s): elevated liver transaminases   Dulaglutide Nausea And Vomiting   Semaglutide     Other Reaction(s): GI side effects nausea   ROS- All systems are reviewed and negative except as per the HPI above  Physical Exam: There were no vitals filed for this visit.   Wt Readings from Last 3 Encounters:  02/08/24 67.3 kg  02/05/24 68.5 kg  01/19/24 68.9 kg    Labs: Lab Results  Component Value Date   NA 136 02/05/2024   K 4.3 02/05/2024   CL 99 02/05/2024   CO2 22 02/05/2024   GLUCOSE 183 (H) 02/05/2024   BUN 12 02/05/2024   CREATININE 0.82 02/05/2024   CALCIUM 9.6 02/05/2024   MG 2.0 02/05/2024   No results found for: INR Lab Results  Component Value Date   CHOL 163 01/16/2018   HDL 27.00 (L) 01/16/2018   LDLCALC 103 (H) 01/16/2018   TRIG 165.0 (H) 01/16/2018   GEN- The patient is well appearing, alert and oriented x 3 today.   Neck - no JVD or carotid bruit noted Lungs- Clear to ausculation bilaterally, normal work of breathing Heart- Regular rate and rhythm, no murmurs, rubs or gallops, PMI not laterally displaced Extremities- no clubbing, cyanosis, or edema Skin - no rash or ecchymosis noted   EKG- Vent. rate 70 BPM PR interval 164 ms QRS duration 106 ms QT/QTcB 376/406 ms P-R-T axes 68 79 15 Normal sinus rhythm Incomplete right bundle branch block T wave abnormality, consider lateral ischemia Abnormal ECG When compared with ECG of 08-Feb-2024 09:21, No significant change was found  ECHO 07/22/21: Normal LV systolic function with visual EF 60-65%. Left ventricle cavity is normal in size. Mild concentric hypertrophy of the left ventricle. Normal global wall motion. Unable to evaluate diastolic function due to underlying rhythm; but filling pattern suggestive of normal LAP. No significant valvular heart disease. Compared to study 04/09/2019 no significant change.   CHA2DS2-VASc Score = 3  The  patient's score is based upon: CHF History: 0 HTN History: 1 Diabetes History: 1 Stroke History: 0 Vascular Disease History: 1 Age Score: 0 Gender Score: 0      ASSESSMENT AND PLAN: Paroxysmal Atrial Fibrillation / flutter (ICD10:  I48.0) The patient's CHA2DS2-VASc score is 3, indicating a 3.2% annual risk of stroke.   History of Afib ablation on 03/31/22 by Dr. Inocencio. S/p DCCV in ED on 02/05/24.  Patient is currently in NSR. We had a discussion about medication treatments and ablation in detail given now two episodes of recent arrhythmia. We discussed potential options such as restarting Multaq  (noted history of LFTs increased) and ablation. Patient  and wife note concern about scheduled out of country trip coming up in November. Due to this, patient would like to try to mitigate Afib burden as much as possible and speak with Dr. Inocencio regarding repeat ablation. Will begin Lopressor  25 mg BID (instead of PRN), restart Multaq  400 mg BID, and help arrange follow up with Dr. Inocencio.  Secondary Hypercoagulable State (ICD10:  D68.69) The patient is at significant risk for stroke/thromboembolism based upon his CHA2DS2-VASc Score of 3.  Continue Apixaban  (Eliquis ).  No missed doses.     Follow up 1 week for repeat ECG.   Dorn Heinrich, PA-C Afib Clinic [facility] [address] [place], [place] [address] [phone]

## 2024-02-15 NOTE — Patient Instructions (Signed)
 Metoprolol  25 mg twice a day   Multaq  400 mg twice a day

## 2024-02-22 ENCOUNTER — Ambulatory Visit (HOSPITAL_COMMUNITY)
Admission: RE | Admit: 2024-02-22 | Discharge: 2024-02-22 | Disposition: A | Discharge status: Home / Self Care | Source: Ambulatory Visit | Attending: Internal Medicine | Admitting: Internal Medicine

## 2024-02-22 DIAGNOSIS — I480 Paroxysmal atrial fibrillation: Secondary | ICD-10-CM | POA: Diagnosis not present

## 2024-02-22 DIAGNOSIS — Z5181 Encounter for therapeutic drug level monitoring: Secondary | ICD-10-CM

## 2024-02-22 NOTE — Progress Notes (Signed)
 Patient began Multaq  after last office visit on 10/30.   ECG today shows  Vent. rate 54 BPM PR interval 178 ms QRS duration 100 ms QT/QTcB 438/415 ms P-R-T axes 51 78 50 Sinus bradycardia Incomplete right bundle branch block Nonspecific T wave abnormality Abnormal ECG When compared with ECG of 15-Feb-2024 10:08, Previous ECG is present   Patient will follow up with EP (waiting to schedule).

## 2024-02-23 ENCOUNTER — Telehealth: Payer: Self-pay | Admitting: Family Medicine

## 2024-02-23 NOTE — Telephone Encounter (Signed)
 Pt has called requesting his CPAP supplies thru his confirmed IFZ:[facility]

## 2024-02-26 NOTE — Telephone Encounter (Signed)
 LMVM for pt as well on his mobile #.

## 2024-02-26 NOTE — Telephone Encounter (Signed)
 I sent message to [employer] about pt contacted us  needing supplies.  There is order from 09/2023.

## 2024-02-27 NOTE — Telephone Encounter (Signed)
 RE: supplies for cpap requested Received: Today New, Adine Neysa Nena GORMAN, RN; New, Bradley; Cain, Mitchell; Tucker, Dolanda; Ziegler, Melissa Received, thank you!     Previous Messages    ----- Message ----- From: Neysa Nena GORMAN, RN Sent: 02/26/2024   9:06 AM EST To: Adine Randolm Ephraim Viktoria; Melissa Ziegler* Subject: supplies for cpap requested                    Pt has contacted us  about needing supplies.    He has current order from 09/2023  Please contact, thank you.  Sandy RN  Thayer LOIS Sane Male, 63 y.o., 5/

## 2024-03-04 NOTE — Telephone Encounter (Signed)
 Pt called stating that  [facility] stated that  They can not release Pt supplies due to order not having the quantity and frequency  for Pt order

## 2024-03-05 ENCOUNTER — Other Ambulatory Visit: Payer: Self-pay | Admitting: *Deleted

## 2024-03-05 DIAGNOSIS — G4733 Obstructive sleep apnea (adult) (pediatric): Secondary | ICD-10-CM

## 2024-03-05 NOTE — Telephone Encounter (Signed)
 Resent message to [employer] with new order.

## 2024-03-05 NOTE — Progress Notes (Unsigned)
 RE: dme cpap supplies Received: Today New, Adine Neysa Nena GORMAN, RN; Joylene Bradley; Cain, Mitchell; Tucker, Dolanda; Ziegler, Melissa Hello,  per note from intake :  Status Update: Checked: RX dated 10/03/23 from Dr. Greig Forbes does not meet patient's insurance guidelines due to no Rx for all supplies no specifications. Patient has shipped order waiting on billable docs.  They need the supplies needed listed out on the order to include heated humidity as well.  Thanks,  Brad       Previous Messages    ----- Message ----- From: Neysa Nena GORMAN, RN Sent: 03/05/2024   9:54 AM EST To: Adine Randolm Ephraim Viktoria; Melissa Ziegler* Subject: dme cpap supplies                              Good morning,  Order for cpap supplies as the prn supplies for this pt.  Pt called yesterday stating that  [facility] [facility] stated that  They can not release Pt supplies due to order not having the quantity and frequency  for Pt order  William Nixon Male, 63 y.o., 07/27/1960 MRN: [id number]     What specific do you need?    [name]

## 2024-03-05 NOTE — Telephone Encounter (Signed)
 I sent message to [employer] to ask.  Looks like prn supplies.

## 2024-03-06 NOTE — Progress Notes (Unsigned)
  Electrophysiology Office Note:   Date:  03/07/2024  ID:  William Nixon, DOB Jan 02, 1961, MRN [id number]  Primary Cardiologist: Gordy Bergamo, MD Electrophysiologist: Will Gladis Norton, MD   Electrophysiologist:  Soyla Gladis Norton, MD      History of Present Illness:   William Nixon is a 63 y.o. male with h/o PAF s/p ablation 03/2022, DM2, HTN, HLD, and and OSA seen today for routine electrophysiology followup.   Since last being seen in our clinic the patient reports doing OK. He has not been very active since he started having more paroxysms of AFL. He is out or rhythm currently, but states he was in normal rhythm with HR in the 60s this am. Denies chest pain or SOB. No fatigue, but as above has limited his activity. He is very interested in pursuing re-do ablation.   Review of systems complete and found to be negative unless listed in HPI.   EP Information / Studies Reviewed:    EKG is not ordered today. EKG from 02/22/2024 reviewed which showed sinus bradycardia at 54 bpm  EKG Interpretation Date/Time:  Thursday March 07 2024 09:43:20 EST Ventricular Rate:  109 PR Interval:    QRS Duration:  102 QT Interval:  382 QTC Calculation: 514 R Axis:   90  Text Interpretation: Atrial flutter with variable A-V block Incomplete right bundle branch block Confirmed by Lesia Sharper [phone]) on 03/07/2024 9:46:10 AM    Arrhythmia/Device History S/p ablation 03/31/2022   Physical Exam:   VS:  BP (!) 164/87   Pulse (!) 109   Ht 5' 8 (1.727 m)   Wt 152 lb (68.9 kg)   SpO2 99%   BMI 23.11 kg/m    Wt Readings from Last 3 Encounters:  03/07/24 152 lb (68.9 kg)  02/15/24 149 lb (67.6 kg)  02/08/24 148 lb 6.4 oz (67.3 kg)     GEN: No acute distress NECK: No JVD; No carotid bruits CARDIAC: Tachy and somewhat irregular rhythm, no murmurs, rubs, gallops RESPIRATORY:  Clear to auscultation without rales, wheezing or rhonchi  ABDOMEN: Soft, non-tender,  non-distended EXTREMITIES:  No edema; No deformity   ASSESSMENT AND PLAN:    Paroxysmal Atrial fibrillation Paroxysmal Atrial flutter Secondary hypercoagulable state S/p ablation 03/2022 Recent EKG as above on Multaq  Continue Eliquis  for CHA2DS2VASc of at least 3   Discussed AFL ablation with redo PVI and pt is agreeable to proceed. Risk, benefits, and alternatives to EP study and radiofrequency ablation for afib were also discussed in detail today. These risks include but are not limited to stroke, bleeding, vascular damage, tamponade, perforation, damage to the esophagus, lungs, and other structures, pulmonary vein stenosis, worsening renal function, and death.   Will need CT as it is a re-do. Will call patient with instructions and labwork pending cath lab availability.       Follow up with EP Team as usual post procedure  Signed, Sharper Prentice Lesia, PA-C

## 2024-03-07 ENCOUNTER — Ambulatory Visit: Attending: Student | Admitting: Student

## 2024-03-07 ENCOUNTER — Encounter: Payer: Self-pay | Admitting: Student

## 2024-03-07 VITALS — BP 164/87 | HR 109 | Ht 68.0 in | Wt 152.0 lb

## 2024-03-07 DIAGNOSIS — I10 Essential (primary) hypertension: Secondary | ICD-10-CM | POA: Diagnosis not present

## 2024-03-07 DIAGNOSIS — D6869 Other thrombophilia: Secondary | ICD-10-CM | POA: Diagnosis not present

## 2024-03-07 DIAGNOSIS — I480 Paroxysmal atrial fibrillation: Secondary | ICD-10-CM | POA: Diagnosis not present

## 2024-03-07 LAB — LAB REPORT - SCANNED
Free T4: 0.9 ng/dL
TSH: 3.38 (ref 0.41–5.90)

## 2024-03-07 NOTE — Patient Instructions (Addendum)
 Medication Instructions:  Your physician recommends that you continue on your current medications as directed. Please refer to the Current Medication list given to you today.  *If you need a refill on your cardiac medications before your next appointment, please call your pharmacy*  Lab Work: None ordered, you will needs labs prior to the ablation but we will tell you when to come in for these once it is scheduled If you have labs (blood work) drawn today and your tests are completely normal, you will receive your results only by: MyChart Message (if you have MyChart) OR A paper copy in the mail If you have any lab test that is abnormal or we need to change your treatment, we will call you to review the results.  Testing/Procedures: We will be in contact with you regarding scheduling ablation.  Follow-Up: At [facility], you and your health needs are our priority.  As part of our continuing mission to provide you with exceptional heart care, our providers are all part of one team.  This team includes your primary Cardiologist (physician) and Advanced Practice Providers or APPs (Physician Assistants and Nurse Practitioners) who all work together to provide you with the care you need, when you need it.  Your next appointment:   Follow up will be determined after your ablation.

## 2024-03-12 ENCOUNTER — Encounter: Payer: Self-pay | Admitting: Internal Medicine

## 2024-03-12 ENCOUNTER — Telehealth: Payer: Self-pay | Admitting: Family Medicine

## 2024-03-12 ENCOUNTER — Telehealth: Payer: Self-pay

## 2024-03-12 DIAGNOSIS — I480 Paroxysmal atrial fibrillation: Secondary | ICD-10-CM

## 2024-03-12 LAB — MICROALBUMIN / CREATININE URINE RATIO: Microalb Creat Ratio: 10

## 2024-03-12 NOTE — Telephone Encounter (Signed)
-----   Message from CMA Mindy I sent at 03/07/2024 10:06 AM EST ----- Regarding: Fib/flutter ablation This pt needs a fib/flutter ablation with [facility]. Pt aware that you will be in contact with him to get this scheduled and provide instructions.  Thanks,  Mindy

## 2024-03-12 NOTE — Telephone Encounter (Signed)
 Pt called stating that [employer] [facility] has sent over an example of how the Cpap supplies order should look like. He stated that it did not meet the standards and does not have the quantity of how many supplies is to be given to him. Please advise.

## 2024-03-12 NOTE — Telephone Encounter (Signed)
 Spoke with the patient and scheduled him for an ablation with Dr. Inocencio on 3/12. Patient is aware he will need labs and CT scan prior to his procedure.

## 2024-03-12 NOTE — Telephone Encounter (Signed)
 Sent a community message to [employer]

## 2024-03-13 NOTE — Telephone Encounter (Signed)
 Go script has been signed and [employer] [facility] has been notified. Patient has been notified and has no further questions.

## 2024-03-17 IMAGING — CT CT ANGIO CHEST
2 of 7 series · 17 of 46 positions shown · IV contrast (APPLIED)
Comparison: Chest x-ray earlier the same day

CLINICAL DATA: Elevated D-dimer

EXAM:
CT ANGIOGRAPHY CHEST WITH CONTRAST
TECHNIQUE: Multidetector CT imaging of the chest was performed using the
standard protocol during bolus administration of intravenous
contrast. Multiplanar CT image reconstructions and MIPs were
obtained to evaluate the vascular anatomy.

[Series 7: thins · axial · 0.62mm/px · z∈[+1170,+1386]mm · 14 of 349 slices shown]
[im 20/349  lung]
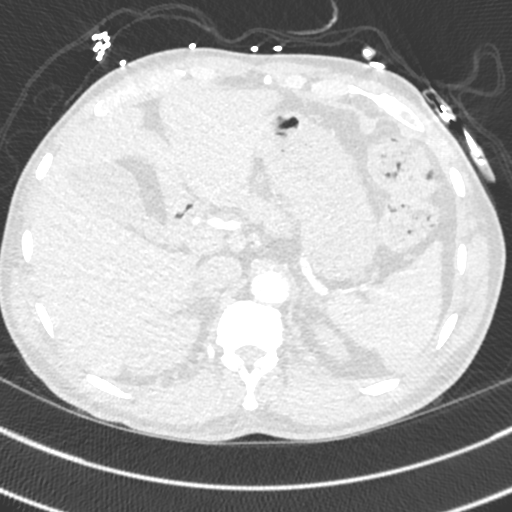
[im 39/349  soft-tissue]
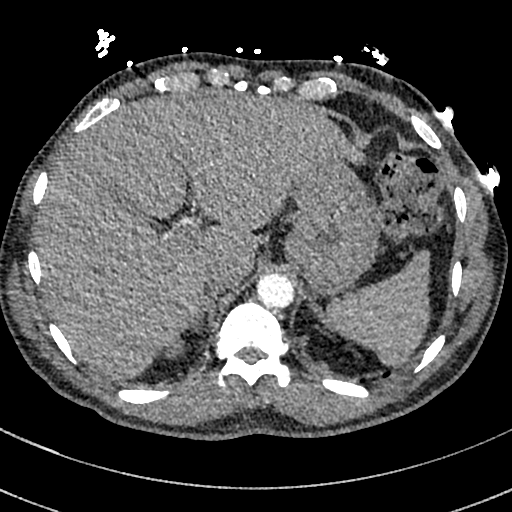
[im 78/349  lung]
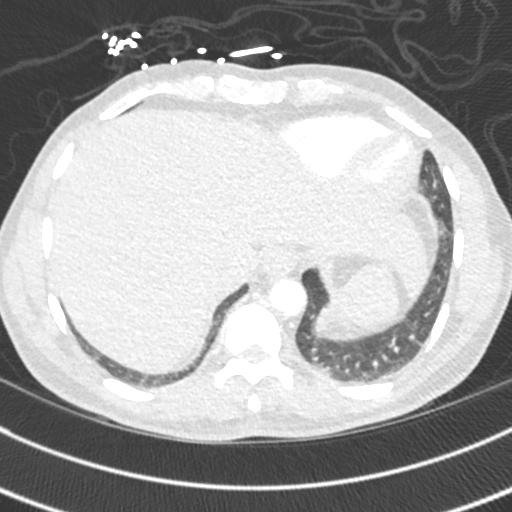
[im 97/349  soft-tissue]
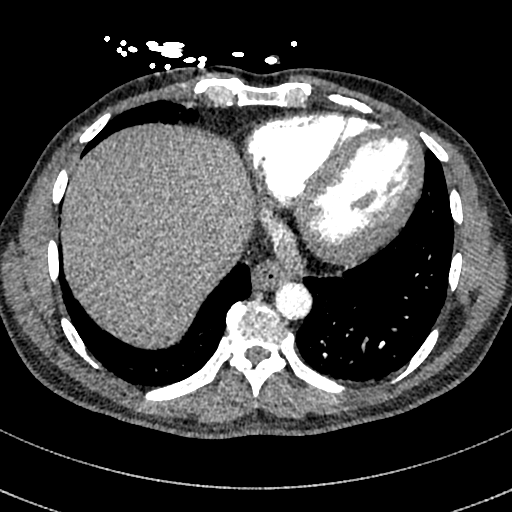
[im 117/349  lung]
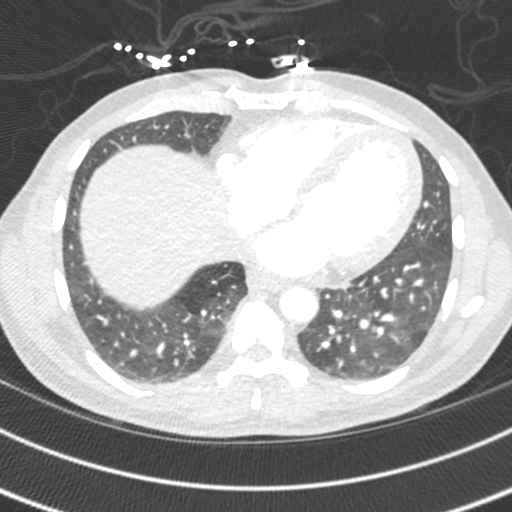
[im 136/349  soft-tissue]
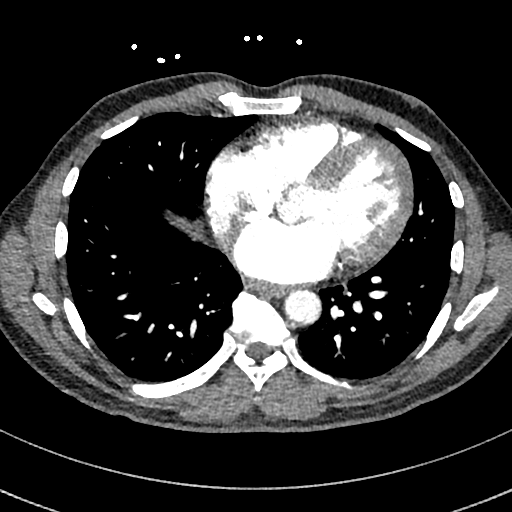
[im 155/349  lung]
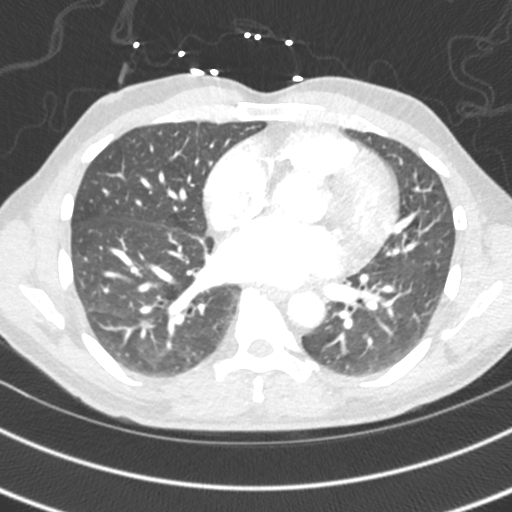
[im 194/349  soft-tissue]
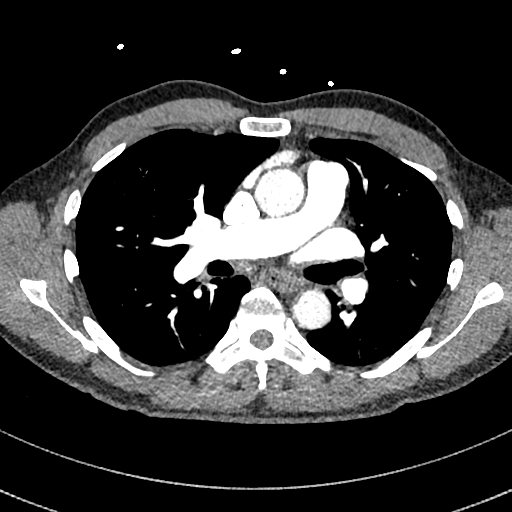
[im 213/349  lung]
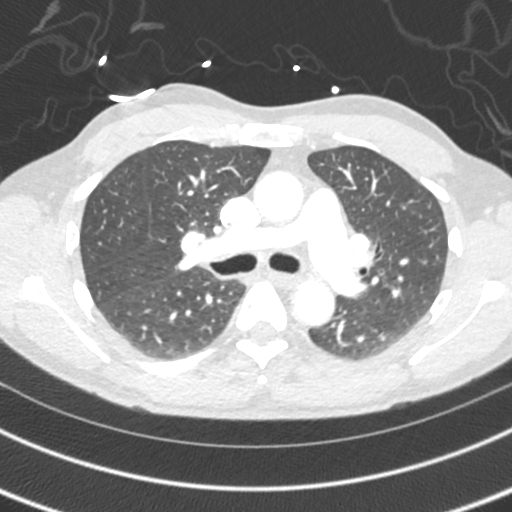
[im 233/349  soft-tissue]
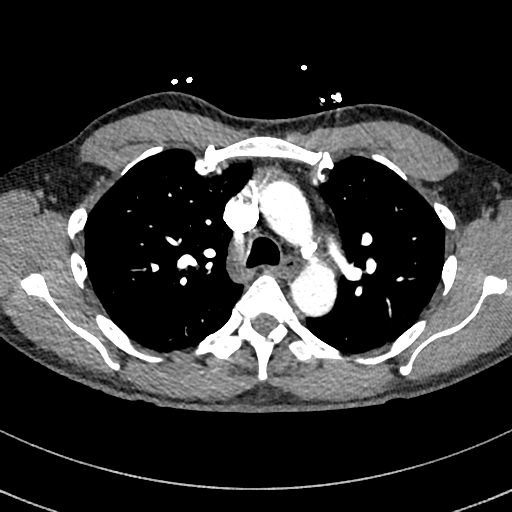
[im 252/349  lung]
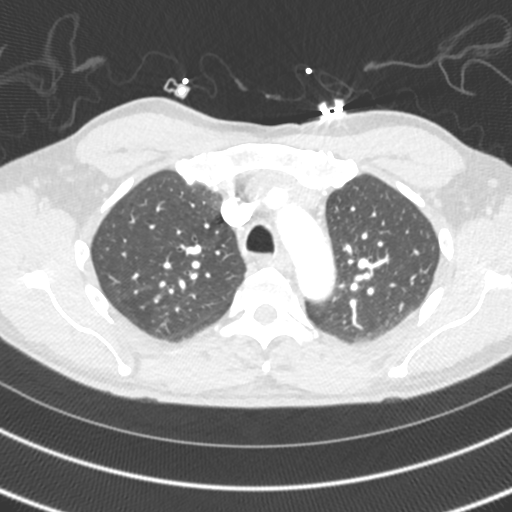
[im 271/349  soft-tissue]
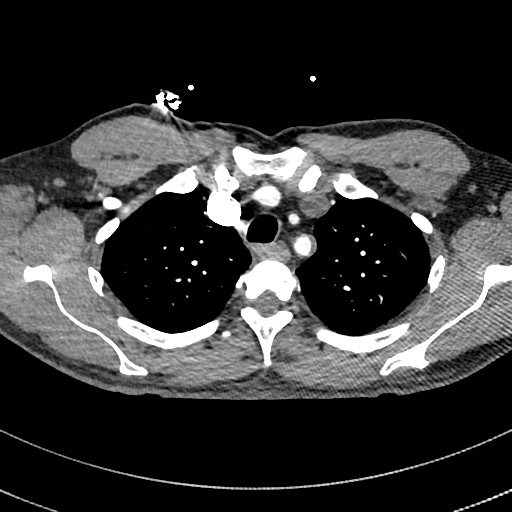
[im 310/349  lung]
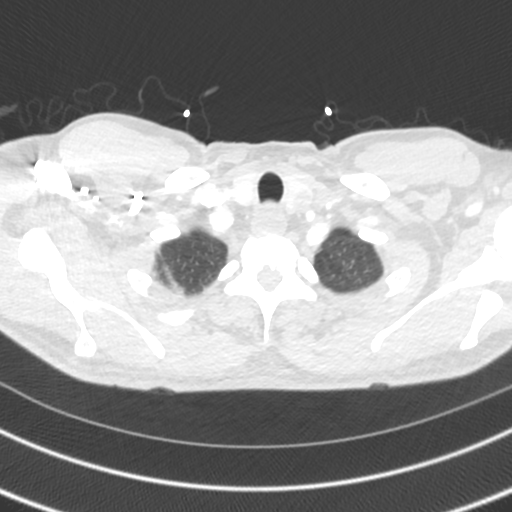
[im 329/349  soft-tissue]
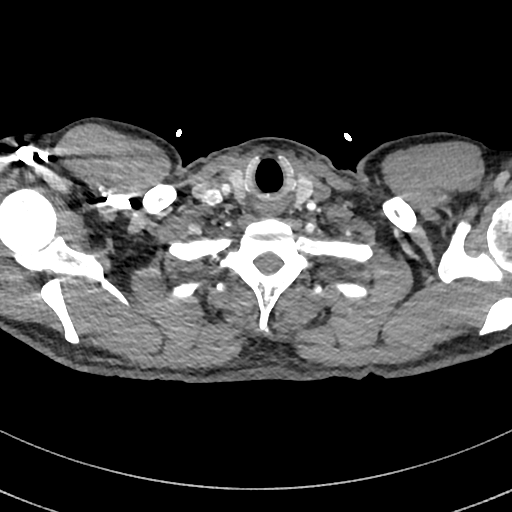

[Series 8: cor · coronal · 0.51mm/px · 3 of 124 slices shown]
[im 31/124  soft-tissue]
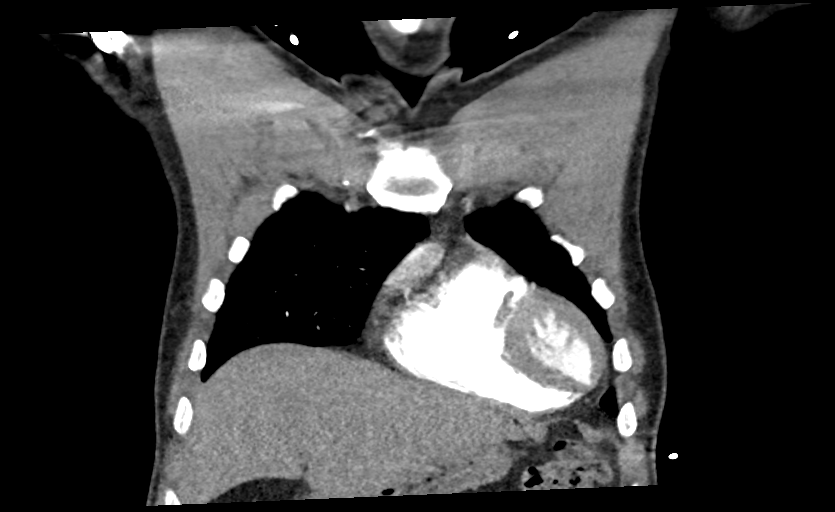
[im 62/124  soft-tissue]
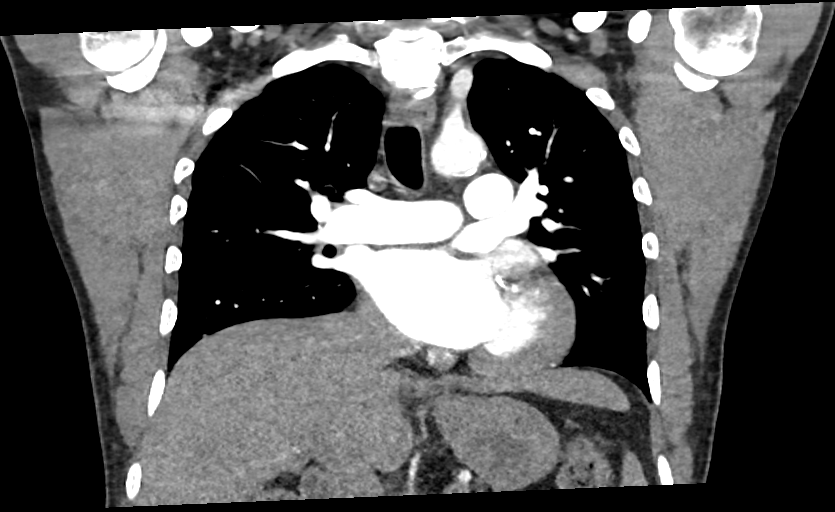
[im 93/124  soft-tissue]
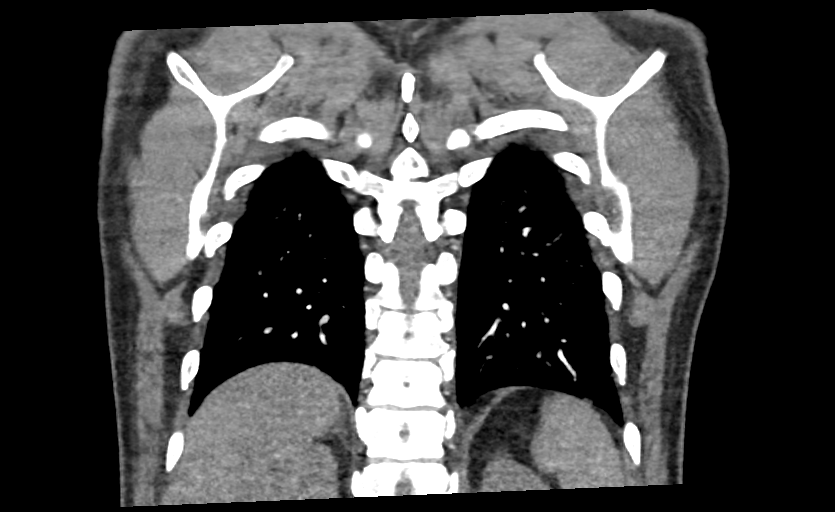

[17 of 46 positions shown; findings below may reference images not displayed]

RADIATION DOSE REDUCTION: This exam was performed according to the
departmental dose-optimization program which includes automated
exposure control, adjustment of the mA and/or kV according to
patient size and/or use of iterative reconstruction technique.

CONTRAST:  100mL OMNIPAQUE IOHEXOL 350 MG/ML SOLN
FINDINGS: Cardiovascular: No pulmonary embolism identified. Main pulmonary
artery is normal caliber. Heart is mildly enlarged. No pericardial
effusion identified. Coronary artery calcifications noted. Thoracic
aorta is normal in caliber with mild atherosclerotic plaques.

Mediastinum/Nodes: 1.4 cm hypodense nodule in the left thyroid lobe.
No bulky axillary, mediastinal or hilar lymphadenopathy identified.

Lungs/Pleura: Breathing motion and mild subsegmental atelectatic
changes in the lungs. No focal consolidation identified. Central
bronchial wall thickening bilaterally. No pleural effusion or
pneumothorax.

Upper Abdomen: No acute process identified.

Musculoskeletal: No chest wall abnormality. No acute or significant
osseous findings.

Review of the MIP images confirms the above findings.
IMPRESSION: 1. No pulmonary embolism identified.
2. Central bronchial wall thickening which is nonspecific, can be
seen with bronchitis.
3. 1.4 cm left thyroid lobe nodule, no follow-up imaging required.
(Ref: [HOSPITAL]. [DATE]): 143-50).

Aortic Atherosclerosis ([id number]).

## 2024-03-17 IMAGING — DX DG CHEST 1V PORT
1 series · 1 of 1 positions shown · non-contrast
Comparison: 01/10/2005

CLINICAL DATA: Syncope.

EXAM:
PORTABLE CHEST 1 VIEW

[chest ap]
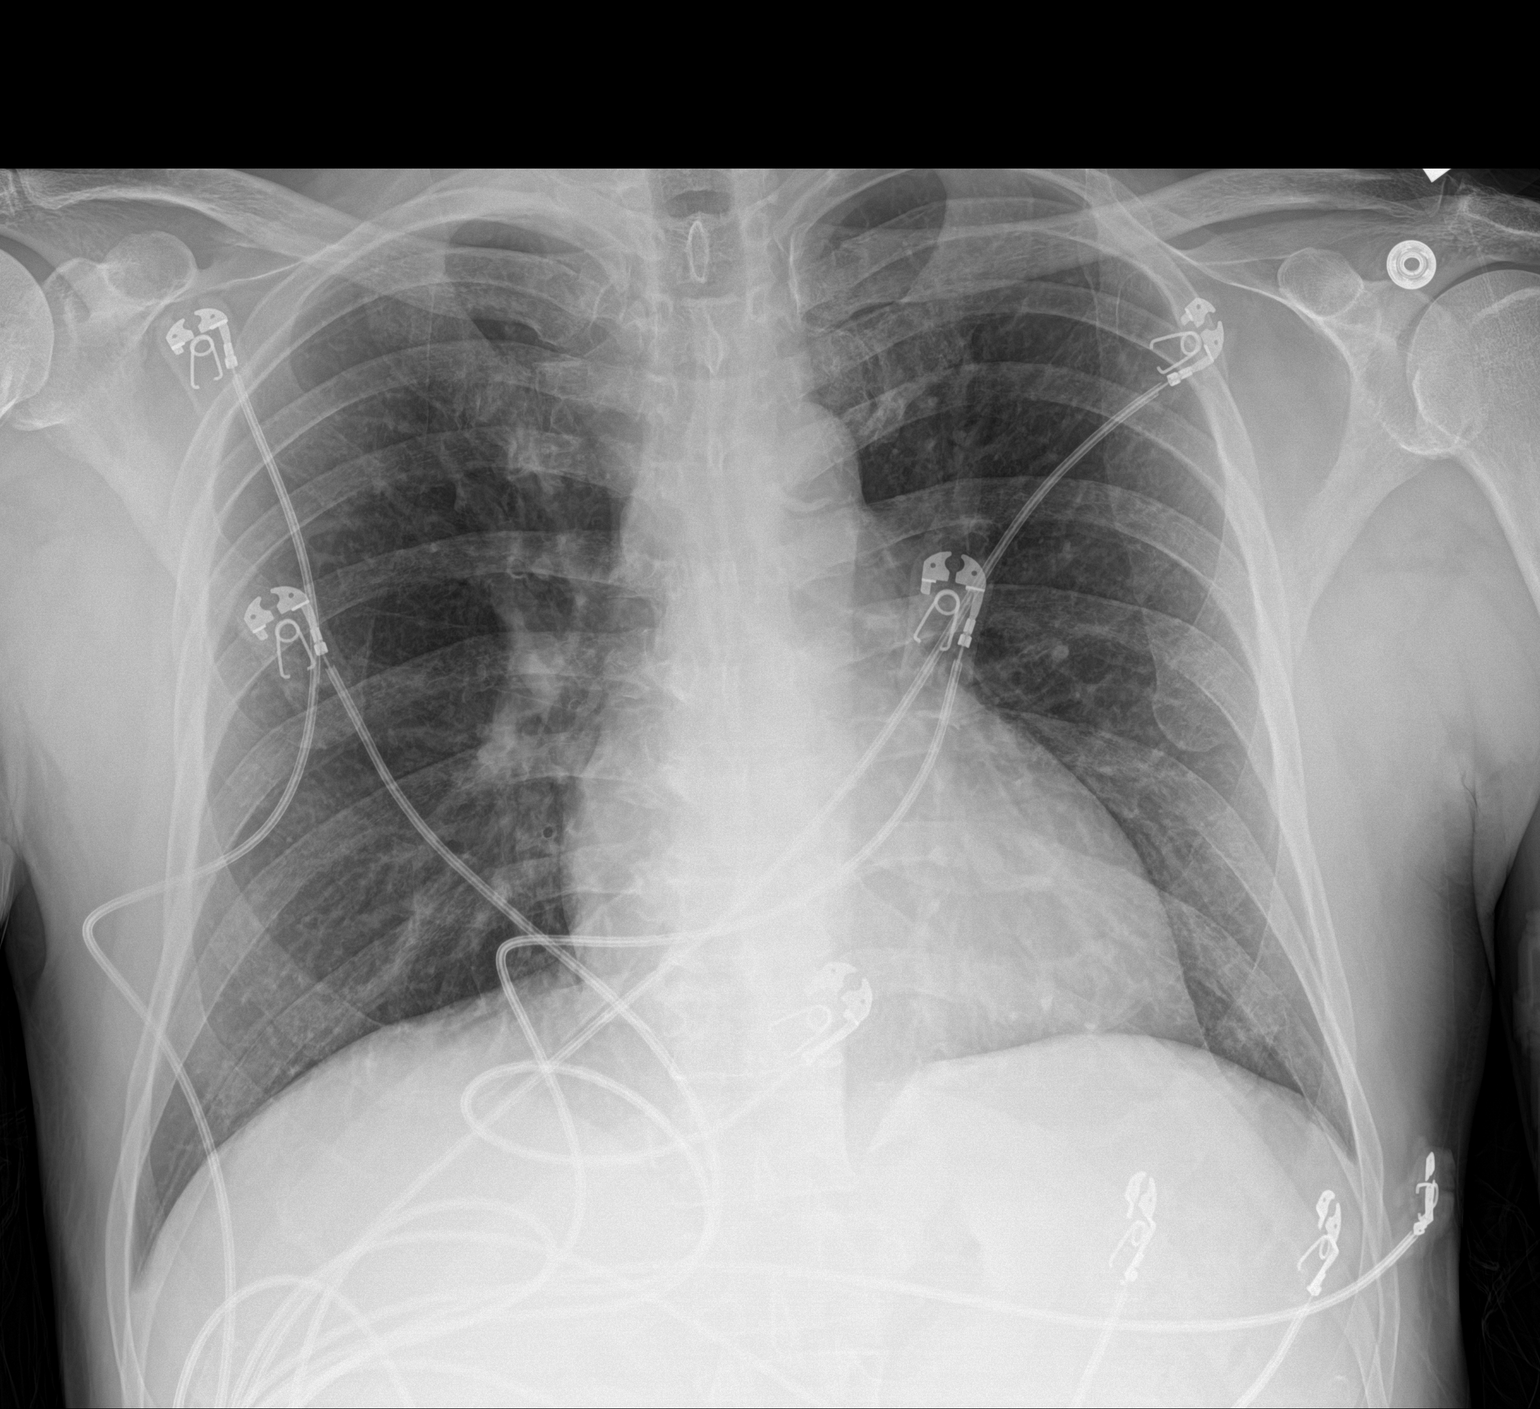

[1 of 1 positions shown; findings below may reference images not displayed]

FINDINGS: The heart size and mediastinal contours are within normal limits.
Both lungs are clear. The visualized skeletal structures are
unremarkable.
IMPRESSION: No active disease.

## 2024-03-20 ENCOUNTER — Other Ambulatory Visit: Payer: Self-pay | Admitting: Internal Medicine

## 2024-03-20 MED ORDER — EMPAGLIFLOZIN-METFORMIN HCL ER 12.5-1000 MG PO TB24: 2.0000 | ORAL_TABLET | Freq: Every day | ORAL | 3 refills | Status: DC

## 2024-03-21 ENCOUNTER — Other Ambulatory Visit (HOSPITAL_COMMUNITY): Payer: Self-pay | Admitting: Internal Medicine

## 2024-04-02 ENCOUNTER — Telehealth: Payer: Self-pay | Admitting: Cardiology

## 2024-04-02 NOTE — Telephone Encounter (Signed)
 Returned pts call. Pt stated he has already spoken to someone and thanked me for my time. Closing encounter.

## 2024-04-02 NOTE — Telephone Encounter (Signed)
 Pt requesting to have his Cath procedure reschedule.

## 2024-04-07 ENCOUNTER — Other Ambulatory Visit: Payer: Self-pay | Admitting: Internal Medicine

## 2024-04-08 NOTE — Telephone Encounter (Signed)
 RX addended to reflect most recent notes

## 2024-05-14 ENCOUNTER — Ambulatory Visit (HOSPITAL_COMMUNITY): Admitting: Internal Medicine

## 2024-05-14 NOTE — Progress Notes (Unsigned)
 Patient ID: William Nixon, male   DOB: 07-12-1960, 64 y.o.   MRN: [id number]  HPI: William Nixon is a 64 y.o.-year-old male, returning for f/u DM2, dx in ~1998, non-insulin-dependent, uncontrolled, without long-term complications. Last visit 4 months ago.  Interim history: He continues exercise: cardio, weights. No increased urination, blurry vision, nausea, chest pain. He had another episode of Aflutter in 01/2024 >> had a stress test: normal. He had a cardioversion. Now undergoing another ablation 06/27/2024. Now on Metoprolol  and Multaq . He went to [place] in 02/2024.  DM2: Reviewed latest HbA1c levels: Lab Results  Component Value Date   HGBA1C 7.0 (A) 01/10/2024   HGBA1C 7.4 (A) 09/25/2023   HGBA1C 7.2 (A) 05/16/2023  02/25/2023: HbA1c 7.4% 01/18/2022: HbA1c 6.9% 01/21/2021: HbA1c 7.1% 02/14/2020: HbA1c 6.6% During summer months, as he is more active, his HbA1c is usually lower.  He is on: Medicine  Breakfast Lunch Dinner  Metformin  ER 1000- Jardiance  12.5 MJ - 1000-12.5   MJ - 1000-12.5  Glipizide  5     G -5  Glipizide  XL 10 G XL -10      Tradjenta  5 T - 5      Acarbose  25 -taking this after meals  A - 25/50 A - 25/50    He also tried herbal remedies: - mustard seed daily >> helping - bitter melon once a week.  He tried Rybelsus >> N/V/weight loss. Tried Trulicity >> N/V and Glu in the 200s. He feels that his sugar control deteriorated after stopping glipizide  XL before visit from 12/21 so we started this back.  Pt checks his sugars 1-2 times a day: - am:  150-165 >> 139-150 >> 139-179, 193 >> 140s >> 116-143, 167 - 2h after b'fast: n/c  - before lunch: n/c >> 103-165 >> n/c  - 2h after lunch: 170-190 >> 162-195, 223 >> 150-170 >> 121-164, 181, 222 - before dinner: 92-114 >> n/c >> 96 >> n/c - 2h after dinner: 162-172 >> n/c >> <170 >> n/c  - bedtime: n/c - nighttime: n/c Lowest sugar was 96 >> 139 >> 140 >> 116; he has hypoglycemia awareness in the  80s. Highest sugar was 223 >> 170 >> 222.  Glucometer: AccuChek (?[employer])  His meals are mostly vegetarian. He continues to walk/run.  -No CKD, last BUN/creatinine: Lab Results  Component Value Date   BUN 12 02/05/2024   CREATININE 0.82 02/05/2024  On olmesartan  40 mg daily. ACR was elevated, while off ACE inhibitor/ARB, to allow metoprolol  dose to be increased for heart rate control.  However, he had an ablation afterwards and he came off metoprolol .  He restarted ARB.  -No persistent microalbuminuria: 03/12/2024: ACR 10 Lab Results  Component Value Date   MICRALBCREAT 22 03/07/2023   MICRALBCREAT 0.0 04/30/2014   MICRALBCREAT 1.3 05/29/2013   MICRALBCREAT 0.7 11/02/2011   MICRALBCREAT 1.8 10/28/2009   MICRALBCREAT 5.8 10/21/2008   MICRALBCREAT 6.6 10/01/2007   MICRALBCREAT 5.2 09/06/2006   -+ HL; last set of lipids:  01/18/2022: 98/184/16/45 01/21/2021: 124/174/24/65 02/11/2020: 130/104/28/81 03/22/2019: 109/114/29/57 Lab Results  Component Value Date   CHOL 163 01/16/2018   HDL 27.00 (L) 01/16/2018   LDLCALC 103 (H) 01/16/2018   LDLDIRECT 96.9 11/07/2012   TRIG 165.0 (H) 01/16/2018   CHOLHDL 6 01/16/2018  On Crestor 10.  - last eye exam was 08/22/2023: No DR (Dr. Arley Ruder).   -No numbness and tingling in his feet.  Last foot exam 07/26/2023.  Thyroid  nodule: -Discovered incidentally, on CT.  Thyroid  U/S (08/13/2021): Parenchymal Echotexture: Mildly heterogenous  Isthmus: 4 mm  Right lobe:  4.4 x 1.4 x 1.5 cm  Left lobe: 4.7 x 1.4 x 1.8 cm  _________________________________________________________   Estimated total number of nodules >/= 1 cm: 1  _________________________________________________________   Nodule # 1:  Location: Left; Inferior  Maximum size: 1.3 cm; Other 2 dimensions: 1.1 x 1.0 cm  Composition: mixed cystic and solid (1)  Echogenicity: hypoechoic (2) Given size (<1.4 cm) and appearance, this nodule does NOT meet TI-RADS criteria for  biopsy or dedicated follow-up.  _________________________________________________________   No additional thyroid  abnormality. No hypervascularity. Negative for adenopathy.   IMPRESSION: 1.3 cm left inferior thyroid  TR 3 nodule does not meet criteria for biopsy or follow-up. This correlates with the chest CT finding.   No other significant abnormality by ultrasound.  Pt denies: - feeling nodules in neck - hoarseness - dysphagia - choking  TFTs were reviewed: Lab Results  Component Value Date   TSH 3.95 05/16/2023   01/18/2022: TSH 3.31, free T4 1.6 01/21/2021: TSH 3.5 02/11/2020: TSH 3.44 08/12/2019: TSH 3.64, free T4 1.2  His thyroid  antibodies are not elevated: Component     Latest Ref Rng 05/16/2023  Thyroglobulin Ab     < or = 1 IU/mL <1   Thyroperoxidase Ab SerPl-aCnc     <9 IU/mL <1    No FH of thyroid  cancer. No h/o radiation tx to head or neck. No herbal supplements. No Biotin use. No steroids use.   He had a syncopal episode in 02/2019 with loss of consciousness for 1 to 2 minutes - he saw cardiology (Dr. Lorie) and this was considered a vasovagal syncope.  He was then found to have paroxysmal A. Fib 2/2 moderate OSA (now on a CPAP).  He had another syncopal episode 07/15/2021.  He was found to be hypotensive (dehydration + overtreatment with antihypertensive medications) at that time.  After the last episode, we decreased the dose of Jardiance  to half a 25 mg tablet >> then back to 25 mg.  He had cardiac ablation for atrial fibrillation 03/2022.  He had cardioversion for atrial flutter 01/2024. He will have another cardiac ablation for atrial flutter 06/2024.  He started a new job in 06/2016.  Now working from home.  Much less stress.  ROS: + See HPI  I reviewed pt's medications, allergies, PMH, social hx, family hx, and changes were documented in the history of present illness. Otherwise, unchanged from my initial visit note.  Past Medical History:   Diagnosis Date   Diabetes mellitus    type II   Hyperlipidemia    pt. states that hdl ldl was elevated denies hyperlipidemia   Hypertension    Hyperthyroidism    pt. denies having any thyroid  issues   Paroxysmal atrial fibrillation (HCC)    Personal history of colonic polyps - adenomas 08/06/2013   07/2013 - 3 diminutive adenomas - repeat colonoscopy 2018   Right hydrocele    S/P ACL repair    right    Sleep apnea    C PAP   Past Surgical History:  Procedure Laterality Date   ATRIAL FIBRILLATION ABLATION N/A 03/31/2022   Procedure: ATRIAL FIBRILLATION ABLATION;  Surgeon: Inocencio Soyla Lunger, MD;  Location: [facility];  Service: Cardiovascular;  Laterality: N/A;   COLONOSCOPY     KNEE SURGERY  2002   right   History   Social History   Marital Status: Married    Spouse Name:  N/A    Number of Children: 1   Occupational History   controller   Social History Main Topics   Smoking status: Never Smoker    Smokeless tobacco: Never Used   Alcohol Use:     1 drink(s) per week   Drug Use: No   Current Outpatient Medications on File Prior to Visit  Medication Sig Dispense Refill   acarbose  (PRECOSE ) 25 MG tablet Take 1 tablet (25 mg total) by mouth 3 (three) times daily with meals. (Patient taking differently: Take 25 mg by mouth at bedtime.) 90 tablet 5   apixaban  (ELIQUIS ) 5 MG TABS tablet Take 1 tablet (5 mg total) by mouth 2 (two) times daily. 60 tablet 5   Ascorbic Acid (VITAMIN C PO) Take 1 tablet by mouth daily.     Boswellia-Glucosamine-Vit D (OSTEO BI-FLEX ONE PER DAY PO) Take 1 tablet by mouth daily.     dronedarone  (MULTAQ ) 400 MG tablet Take 1 tablet (400 mg total) by mouth 2 (two) times daily with a meal. 60 tablet 6   Empagliflozin -metFORMIN  HCl ER 12.08-998 MG TB24 Take 2 tablets by mouth daily with breakfast. 180 tablet 3   glipiZIDE  (GLUCOTROL  XL) 5 MG 24 hr tablet Take 2 tablets (10 mg total) by mouth daily with breakfast. 180 tablet 3   glipiZIDE   (GLUCOTROL ) 5 MG tablet Take 1 tablet by mouth before dinner 30 tablet 5   glucose blood (ONETOUCH VERIO) test strip Use to check blood sugar 3 times a day 300 strip 3   metoprolol  tartrate (LOPRESSOR ) 25 MG tablet Take 1 tablet by mouth twice daily 60 tablet 3   Multiple Vitamin (MULTIVITAMIN WITH MINERALS) TABS Take 1 tablet by mouth daily. Centrum     olmesartan  (BENICAR ) 20 MG tablet TAKE ONE TABLET BY MOUTH EVERY EVENING 90 tablet 2   OneTouch Delica Lancets 33G MISC Use to check blood sugar 3 times a day. 300 each 12   rosuvastatin (CRESTOR) 10 MG tablet Take 10 mg by mouth at bedtime.     TRADJENTA  5 MG TABS tablet Take 1 tablet (5 mg total) by mouth daily. 90 tablet 3   No current facility-administered medications on file prior to visit.   Allergies  Allergen Reactions   Bovine (Beef) Protein-Containing Drug Products     Cultural    Dronedarone      Other Reaction(s): elevated liver transaminases   Dulaglutide Nausea And Vomiting   Semaglutide     Other Reaction(s): GI side effects nausea   Family History  Problem Relation Age of Onset   Diabetes Mother    Diabetes Father    Diabetes Sister    Diabetes Brother    Alcohol abuse Other    Diabetes Other    Hypertension Other    Colon cancer Neg Hx    Colon polyps Neg Hx    Crohn's disease Neg Hx    Esophageal cancer Neg Hx    Rectal cancer Neg Hx    Stomach cancer Neg Hx    Ulcerative colitis Neg Hx    PE: BP 120/60   Pulse 71   Ht 5' 8 (1.727 m)   Wt 153 lb 6.4 oz (69.6 kg)   SpO2 99%   BMI 23.32 kg/m   Wt Readings from Last 3 Encounters:  05/15/24 153 lb 6.4 oz (69.6 kg)  03/07/24 152 lb (68.9 kg)  02/15/24 149 lb (67.6 kg)   Constitutional: normal weight, in NAD Eyes:  EOMI, no exophthalmos ENT: no  neck masses, no cervical lymphadenopathy Cardiovascular: RRR, No MRG Respiratory: CTA B Musculoskeletal: no deformities Skin:no rashes Neurological: no tremor with outstretched hands Diabetic Foot Exam -  Simple   Simple Foot Form Diabetic Foot exam was performed with the following findings: Yes 05/15/2024  9:46 AM  Visual Inspection No deformities, no ulcerations, no other skin breakdown bilaterally: Yes Sensation Testing Intact to touch and monofilament testing bilaterally: Yes Pulse Check Posterior Tibialis and Dorsalis pulse intact bilaterally: Yes Comments    ASSESSMENT: 1. DM2, non-insulin-dependent, uncontrolled, without complications - also FH of mild DM on mother's side  Component     Latest Ref Rng 09/25/2023  Glutamic Acid Decarb Ab     <5 IU/mL <5   Insulin Antibodies, Human     <0.4 U/mL <0.4   IA-2 Antibody     <5.4 U/mL <5.4   ZINC TRANSPORTER 8 (ZnT8) ANTIBODY     <15 U/mL <10   Glucose     65 - 99 mg/dL 839 (H)   C-Peptide     0.80 - 3.85 ng/mL 2.06     2. HL  3.  Left thyroid  nodule  4.  Elevated TSH  PLAN:  1. Patient with longstanding, fairly well-controlled type 2 diabetes, on a complex oral antidiabetic regimen with metformin , sulfonylurea, SGLT2 inhibitor and DPP 4 inhibitor to which we added a glucosidase inhibitor last year.  At last visit, I advised him to try to increase the dose of acarbose  with dinner if sugars after dinner were elevated.  At that time, HbA1c was slightly lower, at 7.0%, decreased from 7.4%.  Sugars were better in the morning but they were still above target, MEN, 40s.  He was also checking after lunch and his sugars were usually at goal but he was not checking in the evening and he was taking acarbose  before dinner only.  I advised him to check some sugars after dinner to see if the acarbose  was working well for him in the evening.  I also advised him to check some sugars at different times of the day.  We did not change the regimen otherwise. - At today's visit, sugars are at goal but with occasional increases in blood sugars in the morning and also after lunch.  Upon questioning, however, he is taking acarbose  after lunch and dinner.   We discussed about the meals approximately 30 minutes before meals and he can take 1 to 2 tablets before each meal.  We can continue the rest of the regimen otherwise. - I suggested to:  Patient Instructions  Please use the following regimen: Medicine  Breakfast Lunch Dinner  Metformin  ER 1000- Jardiance  12.5 MJ - 1000-12.5   MJ - 1000-12.5  Glipizide  5     G -5  Glipizide  XL 10 G XL -10      Tradjenta  5 T - 5      Acarbose  25 A - 25/50 A - 25/50 A - 25/50     Move Acarbose  30 min before meals.  Please try to send me the TSH and the lipid panel.  Please return in 6 months with your sugar log.    - we checked his HbA1c: 6.9% (lower) - advised to check sugars at different times of the day - 2x a day, rotating check times - advised for yearly eye exams >> he is UTD - return to clinic in 6 months  2. HL - Latest lipid panel was at goal:  - He continues on Crestor 10  mg daily without side effects - He mentions that PCP checked his lipid panel recently.  He will send these results to me.  3.  Left thyroid  nodule -This was incidentally seen on CT scan and further evaluated with thyroid  ultrasound.  The nodule appears to be situated in the left inferior gland, and it is solid/cystic, hypoechoic.  It measures 1.3 x 1.1 x 1 cm. - States the nodule did not have any other high risk feature, we are managing him expectantly - He has no neck compression symptoms or masses felt on abduction of his neck today  4.  Elevated TSH - Latest TFTs were normal.  We also checked his antithyroid antibodies, which were elevated: Component     Latest Ref Rng 05/16/2023  Thyroglobulin Ab     < or = 1 IU/mL <1   Thyroperoxidase Ab SerPl-aCnc     <9 IU/mL <1   - Will continue to keep an eye on his TFTs.  At last visit, we discussed about having the TSH checked at his annual physical exam with PCP which was coming up.  I am not able to see these results but he believes that these were checked.  He will send  the results to me.  Lela Fendt, MD PhD [facility] Endocrin[facility]

## 2024-05-15 ENCOUNTER — Ambulatory Visit: Admitting: Internal Medicine

## 2024-05-15 ENCOUNTER — Encounter: Payer: Self-pay | Admitting: Internal Medicine

## 2024-05-15 VITALS — BP 120/60 | HR 71 | Ht 68.0 in | Wt 153.4 lb

## 2024-05-15 DIAGNOSIS — E1169 Type 2 diabetes mellitus with other specified complication: Secondary | ICD-10-CM | POA: Diagnosis not present

## 2024-05-15 DIAGNOSIS — R7989 Other specified abnormal findings of blood chemistry: Secondary | ICD-10-CM | POA: Diagnosis not present

## 2024-05-15 DIAGNOSIS — E785 Hyperlipidemia, unspecified: Secondary | ICD-10-CM | POA: Diagnosis not present

## 2024-05-15 DIAGNOSIS — E041 Nontoxic single thyroid nodule: Secondary | ICD-10-CM | POA: Diagnosis not present

## 2024-05-15 DIAGNOSIS — Z7984 Long term (current) use of oral hypoglycemic drugs: Secondary | ICD-10-CM

## 2024-05-15 DIAGNOSIS — E119 Type 2 diabetes mellitus without complications: Secondary | ICD-10-CM

## 2024-05-15 LAB — POCT GLYCOSYLATED HEMOGLOBIN (HGB A1C): Hemoglobin A1C: 6.9 % — AB (ref 4.0–5.6)

## 2024-05-15 NOTE — Addendum Note (Signed)
 Addended by: CLEOTILDE ROLIN RAMAN on: 05/15/2024 11:19 AM   Modules accepted: Orders

## 2024-05-15 NOTE — Patient Instructions (Addendum)
 Please use the following regimen: Medicine  Breakfast Lunch Dinner  Metformin  ER 1000- Jardiance  12.5 MJ - 1000-12.5   MJ - 1000-12.5  Glipizide  5     G -5  Glipizide  XL 10 G XL -10      Tradjenta  5 T - 5      Acarbose  25 A - 25/50 A - 25/50 A - 25/50     Move Acarbose  30 min before meals.  Please try to send me the TSH and the lipid panel.  Please return in 6 months with your sugar log.

## 2024-05-17 ENCOUNTER — Encounter (HOSPITAL_COMMUNITY): Payer: Self-pay | Admitting: Internal Medicine

## 2024-05-17 ENCOUNTER — Encounter: Payer: Self-pay | Admitting: Internal Medicine

## 2024-05-17 ENCOUNTER — Other Ambulatory Visit: Payer: Self-pay | Admitting: Internal Medicine

## 2024-05-17 ENCOUNTER — Ambulatory Visit (HOSPITAL_COMMUNITY)
Admission: RE | Admit: 2024-05-17 | Discharge: 2024-05-17 | Disposition: A | Source: Ambulatory Visit | Attending: Internal Medicine | Admitting: Internal Medicine

## 2024-05-17 VITALS — BP 142/72 | HR 56 | Ht 68.0 in | Wt 155.0 lb

## 2024-05-17 DIAGNOSIS — D6869 Other thrombophilia: Secondary | ICD-10-CM | POA: Diagnosis not present

## 2024-05-17 DIAGNOSIS — Z5181 Encounter for therapeutic drug level monitoring: Secondary | ICD-10-CM | POA: Diagnosis not present

## 2024-05-17 DIAGNOSIS — I480 Paroxysmal atrial fibrillation: Secondary | ICD-10-CM

## 2024-05-17 DIAGNOSIS — Z79899 Other long term (current) drug therapy: Secondary | ICD-10-CM

## 2024-05-17 MED ORDER — TRIJARDY XR 12.5-2.5-1000 MG PO TB24: ORAL_TABLET | ORAL | 3 refills | Status: AC

## 2024-05-17 MED ORDER — LANCETS MISC: 4 refills | Status: AC

## 2024-05-17 MED ORDER — CONTOUR NEXT MONITOR W/DEVICE KIT: PACK | 0 refills | Status: DC

## 2024-05-17 MED ORDER — CONTOUR NEXT TEST VI STRP: ORAL_STRIP | 4 refills | Status: DC

## 2024-05-17 NOTE — Progress Notes (Signed)
 "  Primary Care Physician: Janey Santos, MD Referring Physician: Dr. Inocencio Divers William Nixon is a 64 y.o. male with a h/o afib that is in the afib clinic for f/u ablation performed 03/31/22 by Dr. Inocencio. Pt has done well without any s/s of afib since procedure. No swallowing or groin issues. History of being on Multaq . Complaint with anticoagulation.  On follow up 02/08/24, patient is currently in NSR. S/p DCCV in ED on 10/20 for atrial flutter with RVR. This is his first episode in ~2 years since ablation. Patient notes he believes the trigger was an unhooked CPAP machine as this took place the night before episode. He is a very active individual and checks his BP daily. His BP is elevated today but at home reads normal. No missed doses of Eliquis .   On follow up 02/15/24, patient is currently in NSR. He contacted clinic yesterday noting his HR was in 150-160 range and took PRN metoprolol  dose. This time there was no clear trigger; he had been using CPAP without issue. He denied any symptoms. No missed doses of Eliquis . [place] is going out of [place] from [place].   Follow-up 05/17/2024 for Multaq  surveillance.  Patient is currently in NSR.  He was last seen by Jodie Passey, PA-C, in November and noted to be in atrial flutter at that time.  No missed doses of Multaq .  No missed doses of Eliquis .  Patient is scheduled for redo ablation in March.  He notes via monitoring rhythm through Apple watch that overall has had 2% or less A-fib burden.  Today, he denies symptoms of palpitations, chest pain, shortness of breath, orthopnea, PND, lower extremity edema, dizziness, presyncope, syncope, or neurologic sequela. The patient is tolerating medications without difficulties and is otherwise without complaint today.   Past Medical History:  Diagnosis Date   Diabetes mellitus    type II   Hyperlipidemia    pt. states that hdl ldl was elevated denies hyperlipidemia   Hypertension     Hyperthyroidism    pt. denies having any thyroid  issues   Paroxysmal atrial fibrillation (HCC)    Personal history of colonic polyps - adenomas 08/06/2013   07/2013 - 3 diminutive adenomas - repeat colonoscopy 2018   Right hydrocele    S/P ACL repair    right    Sleep apnea    C PAP   Past Surgical History:  Procedure Laterality Date   ATRIAL FIBRILLATION ABLATION N/A 03/31/2022   Procedure: ATRIAL FIBRILLATION ABLATION;  Surgeon: Inocencio Soyla Lunger, MD;  Location: [facility];  Service: Cardiovascular;  Laterality: N/A;   COLONOSCOPY     KNEE SURGERY  2002   right    Current Outpatient Medications  Medication Sig Dispense Refill   acarbose  (PRECOSE ) 25 MG tablet Take 1 tablet (25 mg total) by mouth 3 (three) times daily with meals. (Patient taking differently: Take 25 mg by mouth at bedtime.) 90 tablet 5   apixaban  (ELIQUIS ) 5 MG TABS tablet Take 1 tablet (5 mg total) by mouth 2 (two) times daily. 60 tablet 5   Ascorbic Acid (VITAMIN C PO) Take 1 tablet by mouth daily.     Boswellia-Glucosamine-Vit D (OSTEO BI-FLEX ONE PER DAY PO) Take 1 tablet by mouth daily.     dronedarone  (MULTAQ ) 400 MG tablet Take 1 tablet (400 mg total) by mouth 2 (two) times daily with a meal. 60 tablet 6   Empagliflozin -metFORMIN  HCl ER 12.08-998 MG TB24 Take 2 tablets  by mouth daily with breakfast. 180 tablet 3   glipiZIDE  (GLUCOTROL  XL) 5 MG 24 hr tablet Take 2 tablets (10 mg total) by mouth daily with breakfast. 180 tablet 3   glipiZIDE  (GLUCOTROL ) 5 MG tablet Take 1 tablet by mouth before dinner 30 tablet 5   glucose blood (ONETOUCH VERIO) test strip Use to check blood sugar 3 times a day 300 strip 3   metoprolol  tartrate (LOPRESSOR ) 25 MG tablet Take 1 tablet by mouth twice daily 60 tablet 3   Multiple Vitamin (MULTIVITAMIN WITH MINERALS) TABS Take 1 tablet by mouth daily. Centrum     olmesartan  (BENICAR ) 20 MG tablet TAKE ONE TABLET BY MOUTH EVERY EVENING 90 tablet 2   OneTouch Delica Lancets  33G MISC Use to check blood sugar 3 times a day. 300 each 12   rosuvastatin (CRESTOR) 10 MG tablet Take 10 mg by mouth at bedtime.     TRADJENTA  5 MG TABS tablet Take 1 tablet (5 mg total) by mouth daily. 90 tablet 3   No current facility-administered medications for this encounter.    Allergies  Allergen Reactions   Bovine (Beef) Protein-Containing Drug Products     Cultural    Dronedarone      Other Reaction(s): elevated liver transaminases   Dulaglutide Nausea And Vomiting   Semaglutide     Other Reaction(s): GI side effects nausea   ROS- All systems are reviewed and negative except as per the HPI above  Physical Exam: Vitals:   05/17/24 0848  BP: (!) 142/72  Pulse: (!) 56  Weight: 70.3 kg  Height: 5' 8 (1.727 m)     Wt Readings from Last 3 Encounters:  05/17/24 70.3 kg  05/15/24 69.6 kg  03/07/24 68.9 kg    Labs: Lab Results  Component Value Date   NA 136 02/05/2024   K 4.3 02/05/2024   CL 99 02/05/2024   CO2 22 02/05/2024   GLUCOSE 183 (H) 02/05/2024   BUN 12 02/05/2024   CREATININE 0.82 02/05/2024   CALCIUM 9.6 02/05/2024   MG 2.0 02/05/2024   No results found for: INR Lab Results  Component Value Date   CHOL 163 01/16/2018   HDL 27.00 (L) 01/16/2018   LDLCALC 103 (H) 01/16/2018   TRIG 165.0 (H) 01/16/2018   GEN- The patient is well appearing, alert and oriented x 3 today.   Neck - no JVD or carotid bruit noted Lungs- Clear to ausculation bilaterally, normal work of breathing Heart- Regular bradycardic rate and rhythm, no murmurs, rubs or gallops, PMI not laterally displaced Extremities- no clubbing, cyanosis, or edema Skin - no rash or ecchymosis noted   EKG- EKG Interpretation Date/Time:  Friday May 17 2024 08:50:04 EST Ventricular Rate:  56 PR Interval:  184 QRS Duration:  98 QT Interval:  424 QTC Calculation: 409 R Axis:   80  Text Interpretation: Sinus bradycardia Incomplete right bundle branch block Borderline ECG When  compared with ECG of 07-Mar-2024 09:43, Sinus rhythm has replaced Atrial flutter Confirmed by Terra Pac ([id number]) on 05/17/2024 9:05:20 AM    ECHO 07/22/21: Normal LV systolic function with visual EF 60-65%. Left ventricle cavity is normal in size. Mild concentric hypertrophy of the left ventricle. Normal global wall motion. Unable to evaluate diastolic function due to underlying rhythm; but filling pattern suggestive of normal LAP. No significant valvular heart disease. Compared to study 04/09/2019 no significant change.   CHA2DS2-VASc Score = 3  The patient's score is based upon: CHF History: 0 HTN  History: 1 Diabetes History: 1 Stroke History: 0 Vascular Disease History: 1 Age Score: 0 Gender Score: 0      ASSESSMENT AND PLAN: Paroxysmal Atrial Fibrillation / flutter (ICD10:  I48.0) The patient's CHA2DS2-VASc score is 3, indicating a 3.2% annual risk of stroke.   History of Afib ablation on 03/31/22 by Dr. Inocencio. S/p DCCV in ED on 02/05/24.  Patient is currently in NSR.  He is scheduled for redo ablation on 3/12.  We will continue current medication regimen without any changes at this time.  High risk medication monitoring (ICD10: J342684) Patient requires ongoing monitoring for anti-arrhythmic medication which has the potential to cause life threatening arrhythmias or AV block. ECG intervals are stable. Continue Multaq  400 mg twice daily.   Secondary Hypercoagulable State (ICD10:  D68.69) The patient is at significant risk for stroke/thromboembolism based upon his CHA2DS2-VASc Score of 3.  Continue Apixaban  (Eliquis ).  Continue Eliquis  5 mg twice daily without interruption.    Follow up as scheduled for imaging prior to repeat ablation.   Dorn Heinrich, PA-C Afib Clinic [facility] [address] [place], [place] [address] [phone]   "

## 2024-05-21 ENCOUNTER — Other Ambulatory Visit: Payer: Self-pay

## 2024-05-21 ENCOUNTER — Telehealth: Payer: Self-pay

## 2024-05-21 MED ORDER — CONTOUR PLUS TEST VI STRP: ORAL_STRIP | 12 refills | Status: AC

## 2024-05-21 MED ORDER — CONTOUR PLUS BLUE W/DEVICE KIT: 1.0000 | PACK | Freq: Every day | 1 refills | Status: AC

## 2024-05-21 NOTE — Telephone Encounter (Signed)
-----   Message from Nurse Doreatha BROCKS, RN sent at 03/12/2024  5:07 PM EST ----- Regarding: 06/27/24 AF/FL ablation  Precert:  MD: Camnitz Type of ablation: A-fib/flutter Diagnosis: A-fib/flutter CPT code: A-fib (06343) 06344 Ablation scheduled (date/time): 3/12 at 930  Procedure:  Added to calendar? Yes Orders entered? Yes Letter complete? No, >30 days before procedure Scheduled with cath lab? Yes Any medications to hold? No Labs ordered (CBC, BMET, PT/INR if on warfarin): Yes Mapping system: Doesn't matter CARTO/OPAL rep notified? No Cardiac CT needed? Yes, ordered Dye allergy? No Pre-meds ordered and instructions given? No, >30 days before procedure Letter method: MyChart H&P: 11/20 Device: No  Follow-up:  Cassie/Angel, please schedule Routine.  Covering RN - please send this message to [employer], EP scheduler, EP Scheduling pool, EP [employer], and CT scheduler (Brittany Lynch/Stephanie Mogg), if indicated.

## 2024-06-06 ENCOUNTER — Ambulatory Visit (HOSPITAL_COMMUNITY)

## 2024-06-27 ENCOUNTER — Encounter (HOSPITAL_COMMUNITY): Payer: Self-pay

## 2024-06-27 ENCOUNTER — Ambulatory Visit (HOSPITAL_COMMUNITY): Admit: 2024-06-27 | Admitting: Cardiology

## 2024-06-27 SURGERY — ATRIAL FIBRILLATION ABLATION
Anesthesia: General

## 2024-10-08 ENCOUNTER — Telehealth: Admitting: Family Medicine

## 2024-11-13 ENCOUNTER — Ambulatory Visit: Admitting: Internal Medicine
